# Patient Record
Sex: Female | Born: 1995 | Hispanic: Yes | Marital: Married | Smoking: Former smoker
Health system: Southern US, Community
[De-identification: ages and names within clinical notes are randomized; demographics above are authoritative.]

## PROBLEM LIST (undated history)

## (undated) ENCOUNTER — Inpatient Hospital Stay (HOSPITAL_COMMUNITY): Payer: Self-pay

## (undated) DIAGNOSIS — O24419 Gestational diabetes mellitus in pregnancy, unspecified control: Secondary | ICD-10-CM

## (undated) DIAGNOSIS — O09299 Supervision of pregnancy with other poor reproductive or obstetric history, unspecified trimester: Secondary | ICD-10-CM

## (undated) DIAGNOSIS — R51 Headache: Secondary | ICD-10-CM

## (undated) DIAGNOSIS — B999 Unspecified infectious disease: Secondary | ICD-10-CM

## (undated) DIAGNOSIS — R519 Headache, unspecified: Secondary | ICD-10-CM

## (undated) HISTORY — PX: CERVICAL CERCLAGE: SHX1329

## (undated) HISTORY — DX: Supervision of pregnancy with other poor reproductive or obstetric history, unspecified trimester: O09.299

## (undated) HISTORY — PX: WISDOM TOOTH EXTRACTION: SHX21

---
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
## 2015-04-27 ENCOUNTER — Encounter (HOSPITAL_COMMUNITY): Payer: Self-pay | Admitting: Family Medicine

## 2015-04-27 ENCOUNTER — Emergency Department (HOSPITAL_COMMUNITY)
Admission: EM | Admit: 2015-04-27 | Discharge: 2015-04-27 | Disposition: A | Discharge status: Home / Self Care | Payer: Self-pay | Attending: Emergency Medicine | Admitting: Emergency Medicine

## 2015-04-27 ENCOUNTER — Emergency Department (HOSPITAL_COMMUNITY): Payer: Self-pay

## 2015-04-27 DIAGNOSIS — R1030 Lower abdominal pain, unspecified: Secondary | ICD-10-CM

## 2015-04-27 DIAGNOSIS — R1031 Right lower quadrant pain: Secondary | ICD-10-CM | POA: Insufficient documentation

## 2015-04-27 DIAGNOSIS — R52 Pain, unspecified: Secondary | ICD-10-CM

## 2015-04-27 DIAGNOSIS — Z720 Tobacco use: Secondary | ICD-10-CM | POA: Insufficient documentation

## 2015-04-27 DIAGNOSIS — N898 Other specified noninflammatory disorders of vagina: Secondary | ICD-10-CM | POA: Insufficient documentation

## 2015-04-27 DIAGNOSIS — Z331 Pregnant state, incidental: Secondary | ICD-10-CM | POA: Insufficient documentation

## 2015-04-27 DIAGNOSIS — R1032 Left lower quadrant pain: Secondary | ICD-10-CM | POA: Insufficient documentation

## 2015-04-27 DIAGNOSIS — Z3490 Encounter for supervision of normal pregnancy, unspecified, unspecified trimester: Secondary | ICD-10-CM

## 2015-04-27 LAB — BASIC METABOLIC PANEL
Anion gap: 8 (ref 5–15)
BUN: <5 mg/dL — ABNORMAL LOW (ref 6–20)
CHLORIDE: 106 mmol/L (ref 101–111)
CO2: 23 mmol/L (ref 22–32)
Calcium: 8.8 mg/dL — ABNORMAL LOW (ref 8.9–10.3)
Creatinine, Ser: 0.7 mg/dL (ref 0.44–1.00)
GFR calc Af Amer: >60 mL/min (ref >60)
GLUCOSE: 112 mg/dL — AB (ref 65–99)
POTASSIUM: 3.1 mmol/L — AB (ref 3.5–5.1)
Sodium: 137 mmol/L (ref 135–145)

## 2015-04-27 LAB — CBC
HCT: 38.1 % (ref 36.0–46.0)
Hemoglobin: 12.6 g/dL (ref 12.0–15.0)
MCH: 28.4 pg (ref 26.0–34.0)
MCHC: 33.1 g/dL (ref 30.0–36.0)
MCV: 86 fL (ref 78.0–100.0)
PLATELETS: 389 10*3/uL (ref 150–400)
RBC: 4.43 MIL/uL (ref 3.87–5.11)
RDW: 14.4 % (ref 11.5–15.5)
WBC: 5.2 10*3/uL (ref 4.0–10.5)

## 2015-04-27 LAB — WET PREP, GENITAL
Trich, Wet Prep: NONE SEEN
YEAST WET PREP: NONE SEEN

## 2015-04-27 LAB — URINALYSIS, ROUTINE W REFLEX MICROSCOPIC
BILIRUBIN URINE: NEGATIVE
GLUCOSE, UA: NEGATIVE mg/dL
Hgb urine dipstick: NEGATIVE
Ketones, ur: NEGATIVE mg/dL
LEUKOCYTES UA: NEGATIVE
NITRITE: NEGATIVE
PH: 6.5 (ref 5.0–8.0)
PROTEIN: NEGATIVE mg/dL
Specific Gravity, Urine: 1.017 (ref 1.005–1.030)
UROBILINOGEN UA: 1 mg/dL (ref 0.0–1.0)

## 2015-04-27 LAB — HCG, QUANTITATIVE, PREGNANCY: hCG, Beta Chain, Quant, S: 700 m[IU]/mL — ABNORMAL HIGH (ref <5)

## 2015-04-27 LAB — PREGNANCY, URINE: PREG TEST UR: POSITIVE — AB

## 2015-04-27 LAB — ABO/RH: ABO/RH(D): A POS

## 2015-04-27 MED ORDER — POTASSIUM CHLORIDE CRYS ER 20 MEQ PO TBCR
40.0000 meq | EXTENDED_RELEASE_TABLET | Freq: Once | ORAL | Status: AC
  Administered 2015-04-27: 40 meq via ORAL
  Filled 2015-04-27: qty 2

## 2015-04-27 MED ORDER — PRENATAL COMPLETE 14-0.4 MG PO TABS: 2.0000 | ORAL_TABLET | Freq: Every day | ORAL | Status: DC

## 2015-04-27 NOTE — Discharge Instructions (Signed)
1. Medications: prenatal vitamins 2. Treatment: rest, drink plenty of fluids,  3. Follow Up: Please followup with the OB/GYN listed for repeat blood work in 48 hours; return to the ED or MAU for vaginal bleeding, worsening symptoms or other concerns   Abdominal Pain During Pregnancy Abdominal pain is common in pregnancy. Most of the time, it does not cause harm. There are many causes of abdominal pain. Some causes are more serious than others. Some of the causes of abdominal pain in pregnancy are easily diagnosed. Occasionally, the diagnosis takes time to understand. Other times, the cause is not determined. Abdominal pain can be a sign that something is very wrong with the pregnancy, or the pain may have nothing to do with the pregnancy at all. For this reason, always tell your health care provider if you have any abdominal discomfort. HOME CARE INSTRUCTIONS  Monitor your abdominal pain for any changes. The following actions may help to alleviate any discomfort you are experiencing:  Do not have sexual intercourse or put anything in your vagina until your symptoms go away completely.  Get plenty of rest until your pain improves.  Drink clear fluids if you feel nauseous. Avoid solid food as long as you are uncomfortable or nauseous.  Only take over-the-counter or prescription medicine as directed by your health care provider.  Keep all follow-up appointments with your health care provider. SEEK IMMEDIATE MEDICAL CARE IF:  You are bleeding, leaking fluid, or passing tissue from the vagina.  You have increasing pain or cramping.  You have persistent vomiting.  You have painful or bloody urination.  You have a fever.  You notice a decrease in your baby's movements.  You have extreme weakness or feel faint.  You have shortness of breath, with or without abdominal pain.  You develop a severe headache with abdominal pain.  You have abnormal vaginal discharge with abdominal  pain.  You have persistent diarrhea.  You have abdominal pain that continues even after rest, or gets worse. MAKE SURE YOU:   Understand these instructions.  Will watch your condition.  Will get help right away if you are not doing well or get worse. Document Released: 09/24/2005 Document Revised: 07/15/2013 Document Reviewed: 04/23/2013 [facility] Patient Information 2015 [place], [place]. This information is not intended to replace advice given to you by your health care provider. Make sure you discuss any questions you have with your health care provider.    Emergency Department Resource Guide 1) Find a Doctor and Pay Out of Pocket Although you won't have to find out who is covered by your insurance plan, it is a good idea to ask around and get recommendations. You will then need to call the office and see if the doctor you have chosen will accept you as a new patient and what types of options they offer for patients who are self-pay. Some doctors offer discounts or will set up payment plans for their patients who do not have insurance, but you will need to ask so you aren't surprised when you get to your appointment.  2) Contact Your Local Health Department Not all health departments have doctors that can see patients for sick visits, but many do, so it is worth a call to see if yours does. If you don't know where your local health department is, you can check in your phone book. The CDC also has a tool to help you locate your state's health department, and many state websites also have listings of all  of their local health departments.  3) Find a Walk-in Clinic If your illness is not likely to be very severe or complicated, you may want to try a walk in clinic. These are popping up all over the country in pharmacies, drugstores, and shopping centers. They're usually staffed by nurse practitioners or physician assistants that have been trained to treat common illnesses and complaints.  They're usually fairly quick and inexpensive. However, if you have serious medical issues or chronic medical problems, these are probably not your best option.  No Primary Care Doctor: - Call Health Connect at  [phone] - they can help you locate a primary care doctor that  accepts your insurance, provides certain services, etc. - Physician Referral Service- [phone]  Chronic Pain Problems: Organization         Address  Phone   Notes  [facility]  [phone] Patients need to be referred by their primary care doctor.   Medication Assistance: Organization         Address  Phone   Notes  [facility] Medication [facility] [address] [place] [place], [place] [address] [phone] --Must be a resident of [facility] -- Must have NO insurance coverage whatsoever (no Medicaid/ Medicare, etc.) -- The pt. MUST have a primary care doctor that directs their care regularly and follows them in the community   [facility]  [phone]   [employer]  [phone]    Agencies that provide inexpensive medical care: Organization         Address  Phone   Notes  [facility]  [phone]   [facility]    [phone]   [facility] [address] [place], [place] [address] [phone]   [facility] [address], [place] [phone]   Planned Parenthood    8487148163   [facility]    [phone]   [facility]  [address], [place] Phone:  [phone], Fax:  [phone] Hours of Operation:  9 am - 6 pm, M-F.  Also accepts Medicaid/Medicare and self-pay.  [facility]  [address], [place] Phone: [phone], Fax: [phone]. Hours of Operation:  8:30 am - 5:30 pm, M-F.  Also accepts Medicaid and self-pay.  [facility] [place] [address], [place]  Phone: [phone]   [facility] [address] Natasha Bence [place], [place] [phone], Ext. [phone] Mondays & Thursdays: 7-9 AM.  First 15 patients are seen on a first come, first serve basis.    Medicaid-accepting [facility] Providers:  Organization         Address  Phone   Notes  [facility] [address], [place] [phone] Also accepts self-pay patients.  [facility] [address] Laurell Josephs [place], [place]  [phone]   [facility] [address], [place] [phone]   [facility] [address], [place] [phone]   Renaye Rakers [address], [place]   [phone] Only accepts [place] Access [place] patients after they have their name applied to their card.   Self-Pay (no insurance) in [facility]:  Organization         Address  Phone   Notes  Sickle  Cell Patients, [facility] [address], [place] [phone]) [phone]   [facility] [address], [place] [phone]) [phone]   [facility] [place]  [address], [place] [phone]   [facility]/Dr. Osei-Bonsu  [address] [place] [address], [place] or [phone], [place] [phone] Phone number for both [place] and [place] locations is the same.  [facility] [address], [place] [phone]) [phone]   [facility] [address] [place] [address], [place] or [address] [phone]   [facility] [address], [place] [phone](336) [phone], phone; [phone], fax Sees patients 1st and 3rd Saturday of every month.  Must not qualify for public or private insurance (i.e. Medicaid, Medicare, [place] Health Choice, Veterans' Benefits)  Household income should be no more than 200% of the poverty level The clinic cannot  treat you if you are pregnant or think you are pregnant  Sexually transmitted diseases are not treated at the clinic.    Dental Care: Organization         Address  Phone  Notes  [facility] [address], [place] [phone] Accepts children up to age 20 who are enrolled in [place] or [place] Health Choice; pregnant women with a Medicaid card; and children who have applied for Medicaid or [place] Health Choice, but were declined, whose parents can pay a reduced fee at time of service.  [facility] [place]  [address], [place] [phone] Accepts children up to age 20 who are enrolled in [place] or [place] Health Choice; pregnant women with a Medicaid card; and children who have applied for Medicaid or [place] Health Choice, but were declined, whose parents can pay a reduced fee at time of service.  [facility]  [address], [place] [phone]) [phone] Patients are seen by appointment only. Walk-ins are not accepted. [facility] will see patients 20 years of age and older. Monday - Tuesday (8am-5pm) Most Wednesdays (8:30-5pm) $30 per visit, cash only  [facility]  [address], [facility] [phone]) [phone] Patients are seen by appointment only. Walk-ins are not accepted. [facility] will see patients 20 years of age and older. One Wednesday Evening (Monthly: Volunteer Based).  $30 per visit, cash only  [employer] [facility]  [phone] for adults; Children under age 864, call Graduate Pediatric Dentistry at [phone]) [phone]. Children aged 894-14, please call [phone] to request a pediatric application.  Dental services are provided in all areas of dental care including fillings, crowns and bridges, complete and partial dentures, implants, gum treatment, root canals, and extractions. Preventive care is also provided.  Treatment is provided to both adults and children. Patients are selected via a lottery and there is often a waiting list.   [facility] [address], [place]  [phone] [email or web address]   [facility] [address], [place], [place] [phone], Ext. [phone] Second and Fourth Thursday of each month, opens at 6:30 AM; Clinic ends at 9 AM.  Patients are seen on a first-come first-served basis, and a limited number are seen during each clinic.   [facility]  [address], [place], [place] [phone]   Eligibility Requirements  You must have lived in [place], [place], or [place] counties for at least the last three months.   You cannot be eligible for state or federal sponsored [place], including [employer], [place], or [employer].   You generally cannot be eligible for healthcare insurance through your employer.    How to apply: Eligibility screenings are held every Tuesday and Wednesday afternoon from 1:00 pm until 4:00 pm. You do not need an appointment for the interview!  [facility] [address] [place], [place] [phone]   [facility] Department  [phone]   [facility] Health Department  [phone]   [facility] Health Department  [phone]    Behavioral Health Resources in the Community: Intensive Outpatient Programs Organization         Address  Phone  Notes  [facility] [address] [place], [place] [phone]   [facility] [address], [place], [place] [phone]   [facility] [address], [place], [place]  [phone]   [facility] [address],  [place], [place] [phone] or [phone]) [phone]   Substance Abuse Resources Organization         Address  Phone  Notes  [facility]  [phone]   [facility]   [phone])[phone]   [facility] [place]  (774)036-1845   Floydene Flock  3341995754   Residential & Outpatient Substance Abuse Program  [phone]   Psychological Services Organization         Address  Phone  Notes  [facility]  [phone]   [facility]  ([phone]   [facility] [address] [place] [phone]) [phone] or [phone]    Mobile Crisis Teams Organization         Address  Phone  Notes  [facility], Mobile Crisis Care Unit  [phone]   [facility]  [address] [place], [place] [phone]   Doristine Locks [address] [place] [phone]    Self-Help/Support Groups Organization         Address  Phone             Notes  [facility] [place] - variety of support groups  336- I7437963 Call for more information  Narcotics Anonymous (NA), [facility] [address], [employer] [place]  2 meetings at this location   Statistician         Address  Phone  Notes  [facility] [address],    [place]  [phone]   [facility]  [address] [place] [address], [place], [place] [phone]   [facility] [address] [place], [place] [phone] Admissions: 8am-3pm M-F  [facility] [address]    [place], [place] [phone]   [facility] [address] Starling Manns [place], [place] [phone]   [facility] [address],  [place], [place] [phone]   [facility] - Intensive Outpatient [address] Laurell Josephs [address], [place], [place] [phone]   [facility] ([facility] [address]  [place], [place] [phone] or [phone])[phone]   [facility] ([facility]) [address], [place], [place] [phone] Accepts Medicaid  [facility] [place] [address],  [place] [phone] Substance  Abuse/Addiction Treatment   [facility] Resources Organization  Address  Phone  Notes  °[facility]  (888) [phone]   °Julie Brannon, PhD [address] [place], [place]   (336) 349-5553 or (336) 951-0000   ° Behavioral   [address] °[place], [employer] ([phone]) [phone]   °[facility] [address], [place], [place] ([phone]) [phone] Insurance/Medicaid/sponsorship through [facility]  °[facility] [address]                                    [place], [place] (336) [phone] Therapy/tele-psych/case  °[facility] [address]  ° [place], [place] ([phone]) [phone]    °Dr. Arfeen  ([phone]) [phone]   °[facility] [place]  [employer] [place] [facility] 1) [address],  °2) [address], [place] °3)  [address], [place] ([phone]) [phone] °(336) [phone] ° °(336) [phone]   °[place] Child Abuse Hotline (336) 342-1394 or (336) 342-3537 (After Hours)    ° ° ° ° °

## 2015-04-27 NOTE — ED Notes (Signed)
Patient is alert and orientedx4.  Patient was explained discharge instructions and they understood them with no questions.  The husband is taking the patient home.

## 2015-04-27 NOTE — ED Provider Notes (Signed)
CSN: [id number]     Arrival date & time 04/27/15  1611 History   First MD Initiated Contact with Patient 04/27/15 1713     Chief Complaint  Patient presents with  . Abdominal Pain     (Consider location/radiation/quality/duration/timing/severity/associated sxs/prior Treatment) Patient is a 19 y.o. female presenting with abdominal pain. The history is provided by the patient and medical records. No language interpreter was used.  Abdominal Pain Associated symptoms: no chest pain, no constipation, no cough, no diarrhea, no dysuria, no fatigue, no fever, no hematuria, no nausea, no shortness of breath and no vomiting      Cynthia Valenzuela is a 19 y.o. female G2P0010 with no major medical hgx presents to the Emergency Department complaining of gradual, persistent, progressively worsening lower abd pain onset 3 weeks.  Pt reports the pain is similar to menstrual cramps, sometimes being sharp, resolving on their on.  At the worst pt rates her pain at 7/10.  No cramping at this time.  She denies vaginal bleeding, dysuria, vaginal discharge, nausea, vomiting.  No associated symptoms, no aggravating or alleviating factors.    Pt denies fever, chills, headache, neck pain, chest pain, SOB, N/V/D, dysuria.   Pt reports some juice intake but no water intake on a regular basis.   LMP: 03/24/2015 but shorter than usual.   History reviewed. No pertinent past medical history. History reviewed. No pertinent past surgical history. History reviewed. No pertinent family history. History  Substance Use Topics  . Smoking status: Light Tobacco Smoker  . Smokeless tobacco: Not on file  . Alcohol Use: No   OB History    No data available     Review of Systems  Constitutional: Negative for fever, diaphoresis, appetite change, fatigue and unexpected weight change.  HENT: Negative for mouth sores.   Eyes: Negative for visual disturbance.  Respiratory: Negative for cough, chest tightness, shortness of breath  and wheezing.   Cardiovascular: Negative for chest pain.  Gastrointestinal: Positive for abdominal pain. Negative for nausea, vomiting, diarrhea and constipation.  Endocrine: Negative for polydipsia, polyphagia and polyuria.  Genitourinary: Negative for dysuria, urgency, frequency and hematuria.  Musculoskeletal: Negative for back pain and neck stiffness.  Skin: Negative for rash.  Allergic/Immunologic: Negative for immunocompromised state.  Neurological: Negative for syncope, light-headedness and headaches.  Hematological: Does not bruise/bleed easily.  Psychiatric/Behavioral: Negative for sleep disturbance. The patient is not nervous/anxious.       Allergies  Review of patient's allergies indicates no known allergies.  Home Medications   Prior to Admission medications   Medication Sig Start Date End Date Taking? Authorizing Provider  Prenatal Vit-Fe Fumarate-FA (PRENATAL COMPLETE) 14-0.4 MG TABS Take 2 tablets by mouth daily. 04/27/15   Dontrez Pettis, PA-C   BP 95/54 mmHg  Pulse 77  Temp(Src) 97.7 F (36.5 C) (Oral)  Resp 20  SpO2 100%  LMP 03/24/2015 Physical Exam  Constitutional: She appears well-developed and well-nourished. No distress.  Awake, alert, nontoxic appearance  HENT:  Head: Normocephalic and atraumatic.  Mouth/Throat: Oropharynx is clear and moist. No oropharyngeal exudate.  Eyes: Conjunctivae are normal. No scleral icterus.  Neck: Normal range of motion. Neck supple.  Cardiovascular: Normal rate, regular rhythm, normal heart sounds and intact distal pulses.   No murmur heard. Pulmonary/Chest: Effort normal and breath sounds normal. No respiratory distress. She has no wheezes.  Equal chest expansion  Abdominal: Soft. Bowel sounds are normal. She exhibits no distension and no mass. There is no tenderness. There is no rebound and  no guarding. Hernia confirmed negative in the right inguinal area and confirmed negative in the left inguinal area.   Genitourinary: Uterus normal. No labial fusion. There is no rash, tenderness or lesion on the right labia. There is no rash, tenderness or lesion on the left labia. Uterus is not deviated, not enlarged, not fixed and not tender. Cervix exhibits no motion tenderness, no discharge and no friability. Right adnexum displays no mass, no tenderness and no fullness. Left adnexum displays no mass, no tenderness and no fullness. No erythema, tenderness or bleeding in the vagina. No foreign body around the vagina. No signs of injury around the vagina. Vaginal discharge: leukorrhea.  Musculoskeletal: Normal range of motion. She exhibits no edema.  Lymphadenopathy:       Right: No inguinal adenopathy present.       Left: No inguinal adenopathy present.  Neurological: She is alert.  Speech is clear and goal oriented Moves extremities without ataxia  Skin: Skin is warm and dry. She is not diaphoretic. No erythema.  Psychiatric: She has a normal mood and affect.  Nursing note and vitals reviewed.   ED Course  Procedures (including critical care time) Labs Review Labs Reviewed  WET PREP, GENITAL - Abnormal; Notable for the following:    Clue Cells Wet Prep HPF POC FEW (*)    WBC, Wet Prep HPF POC MODERATE (*)    All other components within normal limits  PREGNANCY, URINE - Abnormal; Notable for the following:    Preg Test, Ur POSITIVE (*)    All other components within normal limits  HCG, QUANTITATIVE, PREGNANCY - Abnormal; Notable for the following:    hCG, Beta Chain, Quant, S 700 (*)    All other components within normal limits  BASIC METABOLIC PANEL - Abnormal; Notable for the following:    Potassium 3.1 (*)    Glucose, Bld 112 (*)    BUN <5 (*)    Calcium 8.8 (*)    All other components within normal limits  URINALYSIS, ROUTINE W REFLEX MICROSCOPIC (NOT AT [facility])  CBC  HIV ANTIBODY (ROUTINE TESTING)  ABO/RH  GC/CHLAMYDIA PROBE AMP ([place]) NOT AT [facility]    Imaging Review US Ob Comp  Less 14 Wks  04/27/2015   CLINICAL DATA:  Intermittent lower abdominal pain for 3 weeks. Positive pregnancy test. Quantitative beta HCG 700.  EXAM: OBSTETRIC <14 WK [place] AND TRANSVAGINAL OB US  TECHNIQUE: Both transabdominal and transvaginal ultrasound examinations were performed for complete evaluation of the gestation as well as the maternal uterus, adnexal regions, and pelvic cul-de-sac. Transvaginal technique was performed to assess early pregnancy.  COMPARISON:  None.  FINDINGS: Intrauterine gestational sac: Not visualized  Yolk sac:  Not visualized  Embryo:  Not visualized  Cardiac Activity: Not detected  Maternal uterus/adnexae: Uterus is normal in size and retroverted. Endometrium measures 8.6 mm. No visualized IUP. Uterus measures 8.2 x 5.1 x 5.9 cm. Right ovary measures 3.6 x 2.6 x 2.9 cm. Left ovary measures 2.1 x 1.7 x 2.1 cm. Small ovarian follicles bilaterally. No ovarian or definite adnexal abnormality. Small amount of pelvic free fluid.  IMPRESSION: No visualized IUP or acute abnormality by ultrasound. Findings suggest a pregnancy too early to visualize. Difficult to completely exclude ectopic.  No definite adnexal or ovarian abnormality.  Trace pelvic free fluid.   Electronically Signed   By: Judie Petit.  Shick M.D.   On: 04/27/2015 19:04   US Ob Transvaginal  04/27/2015   CLINICAL DATA:  Intermittent lower  abdominal pain for 3 weeks. Positive pregnancy test. Quantitative beta HCG 700.  EXAM: OBSTETRIC <14 WK [place] AND TRANSVAGINAL OB US  TECHNIQUE: Both transabdominal and transvaginal ultrasound examinations were performed for complete evaluation of the gestation as well as the maternal uterus, adnexal regions, and pelvic cul-de-sac. Transvaginal technique was performed to assess early pregnancy.  COMPARISON:  None.  FINDINGS: Intrauterine gestational sac: Not visualized  Yolk sac:  Not visualized  Embryo:  Not visualized  Cardiac Activity: Not detected  Maternal uterus/adnexae: Uterus is normal in size and  retroverted. Endometrium measures 8.6 mm. No visualized IUP. Uterus measures 8.2 x 5.1 x 5.9 cm. Right ovary measures 3.6 x 2.6 x 2.9 cm. Left ovary measures 2.1 x 1.7 x 2.1 cm. Small ovarian follicles bilaterally. No ovarian or definite adnexal abnormality. Small amount of pelvic free fluid.  IMPRESSION: No visualized IUP or acute abnormality by ultrasound. Findings suggest a pregnancy too early to visualize. Difficult to completely exclude ectopic.  No definite adnexal or ovarian abnormality.  Trace pelvic free fluid.   Electronically Signed   By: Judie Petit.  Shick M.D.   On: 04/27/2015 19:04     EKG Interpretation None      MDM   Final diagnoses:  Pregnancy  Lower abdominal pain  Bilateral lower abdominal cramping   Cynthia Valenzuela presents with lower abd cramping x 3 weeks; intermittent.  Pt with positive pregnancy test.  No CMT; leukorrhea, cervical os closed. Labs and [place] pending.    7:32 PM Labs reassuring. No evidence of vaginal infection. Mild hypokalemia repleted in the department.  HCG 700 therefore I would not expect to see IUP. Ultrasound performed prior to result of hCG, no evidence of IUP but no evidence of ectopic either. Patient is to follow up with women's outpatient clinic for recheck of her hCG in 48 hours. This was discussed at length with her. If she cannot follow in the women's clinic or MAU she is to return to this emergency department.  Other pregnancy information given. All questions answered. Patient with stable vital signs and pain-free upon discharge. She is tolerating by mouth without difficulty.  BP 113/67 mmHg  Pulse 73  Temp(Src) 97.7 F (36.5 C) (Oral)  Resp 20  SpO2 100%  LMP 03/24/2015   Cynthia Client Maxon Kresse, PA-C 04/27/15 1939  Pricilla Loveless, MD 04/27/15 2256

## 2015-04-27 NOTE — ED Notes (Signed)
Patient transported to Ultrasound 

## 2015-04-27 NOTE — ED Notes (Signed)
Pt here for intermittent lower abd pain x 3 weeks with some diarrhea. Denies vaginal bleeding or abnormal discharge. Denies urinary complaints,

## 2015-04-27 NOTE — ED Notes (Signed)
MD at bedside. 

## 2015-04-28 LAB — HIV ANTIBODY (ROUTINE TESTING W REFLEX): HIV Screen 4th Generation wRfx: NONREACTIVE

## 2015-04-28 LAB — GC/CHLAMYDIA PROBE AMP (~~LOC~~) NOT AT ARMC
CHLAMYDIA, DNA PROBE: NEGATIVE
NEISSERIA GONORRHEA: NEGATIVE

## 2015-04-29 ENCOUNTER — Telehealth (HOSPITAL_COMMUNITY): Payer: Self-pay | Admitting: *Deleted

## 2015-04-29 ENCOUNTER — Encounter (HOSPITAL_COMMUNITY): Payer: Self-pay | Admitting: *Deleted

## 2015-04-29 ENCOUNTER — Inpatient Hospital Stay (HOSPITAL_COMMUNITY)
Admission: AD | Admit: 2015-04-29 | Discharge: 2015-04-29 | Disposition: A | Discharge status: Home / Self Care | Payer: Self-pay | Source: Ambulatory Visit | Attending: Obstetrics & Gynecology | Admitting: Obstetrics & Gynecology

## 2015-04-29 DIAGNOSIS — O209 Hemorrhage in early pregnancy, unspecified: Secondary | ICD-10-CM | POA: Insufficient documentation

## 2015-04-29 DIAGNOSIS — O3680X0 Pregnancy with inconclusive fetal viability, not applicable or unspecified: Secondary | ICD-10-CM

## 2015-04-29 DIAGNOSIS — Z3A01 Less than 8 weeks gestation of pregnancy: Secondary | ICD-10-CM | POA: Insufficient documentation

## 2015-04-29 DIAGNOSIS — O4691 Antepartum hemorrhage, unspecified, first trimester: Secondary | ICD-10-CM

## 2015-04-29 DIAGNOSIS — Z87891 Personal history of nicotine dependence: Secondary | ICD-10-CM | POA: Insufficient documentation

## 2015-04-29 LAB — URINALYSIS, ROUTINE W REFLEX MICROSCOPIC
Bilirubin Urine: NEGATIVE
GLUCOSE, UA: NEGATIVE mg/dL
Nitrite: NEGATIVE
PROTEIN: 30 mg/dL — AB
Specific Gravity, Urine: 1.03 (ref 1.005–1.030)
Urobilinogen, UA: 0.2 mg/dL (ref 0.0–1.0)
pH: 6 (ref 5.0–8.0)

## 2015-04-29 LAB — URINE MICROSCOPIC-ADD ON

## 2015-04-29 LAB — HCG, QUANTITATIVE, PREGNANCY: hCG, Beta Chain, Quant, S: 586 m[IU]/mL — ABNORMAL HIGH (ref <5)

## 2015-04-29 NOTE — MAU Note (Signed)
Patient presents stating that she had a positive pregnancy test confirmed at [facility] on 7/20 but is experiencing abdominal pain and vaginal bleeding similar to a period this morning. Denies discharge.

## 2015-04-29 NOTE — MAU Provider Note (Signed)
History     CSN: [id number]  Arrival date and time: 04/29/15 2130   First Provider Initiated Contact with Patient 04/29/15 1114      Chief Complaint  Patient presents with  . Abdominal Pain  . Vaginal Bleeding   HPI   Ms. Cynthia Valenzuela is a 19 y.o. female G2P0010 at  [redacted]w[redacted]d presenting to MAU with vaginal bleeding. She was seen at [facility] 2 days ago and had a pregnancy hormone level of 700; [place] did not show anything. She was instructed to go to the [facility] today for a repeat beta hcg level, however the patient came here because she is bleeding. The vaginal bleeding is light; red in color. The patient had intercourse last night and noticed the bleeding after. She does not have a pad on, the bleeding is not heavy enough to wear a pad.   OB History    Gravida Para Term Preterm AB TAB SAB Ectopic Multiple Living   2    1 1           History reviewed. No pertinent past medical history.  History reviewed. No pertinent past surgical history.  No family history on file.  History  Substance Use Topics  . Smoking status: Former Smoker    Quit date: 04/22/2015  . Smokeless tobacco: Never Used  . Alcohol Use: Yes     Comment: has not drank in a while     Allergies: No Known Allergies  Prescriptions prior to admission  Medication Sig Dispense Refill Last Dose  . Prenatal Vit-Fe Fumarate-FA (PRENATAL MULTIVITAMIN) TABS tablet Take 1 tablet by mouth daily at 12 noon.   04/28/2015 at Unknown time   Results for orders placed or performed during the hospital encounter of 04/29/15 (from the past 48 hour(s))  Urinalysis, Routine w reflex microscopic (not at [facility])     Status: Abnormal   Collection Time: 04/29/15 10:18 AM  Result Value Ref Range   Color, Urine YELLOW YELLOW   APPearance HAZY (A) CLEAR   Specific Gravity, Urine 1.030 1.005 - 1.030   pH 6.0 5.0 - 8.0   Glucose, UA NEGATIVE NEGATIVE mg/dL   Hgb urine dipstick LARGE (A) NEGATIVE   Bilirubin Urine NEGATIVE NEGATIVE    Ketones, ur >80 (A) NEGATIVE mg/dL   Protein, ur 30 (A) NEGATIVE mg/dL   Urobilinogen, UA 0.2 0.0 - 1.0 mg/dL   Nitrite NEGATIVE NEGATIVE   Leukocytes, UA TRACE (A) NEGATIVE  Urine microscopic-add on     Status: Abnormal   Collection Time: 04/29/15 10:18 AM  Result Value Ref Range   Squamous Epithelial / LPF MANY (A) RARE   WBC, UA 0-2 <3 WBC/hpf   Urine-Other MICROSCOPIC EXAM PERFORMED ON UNCONCENTRATED URINE     Comment: <1 ML URINE COLLECTED MUCOUS PRESENT   hCG, quantitative, pregnancy     Status: Abnormal   Collection Time: 04/29/15 11:24 AM  Result Value Ref Range   hCG, Beta Chain, Quant, S 586 (H) <5 mIU/mL    Comment:          GEST. AGE      CONC.  (mIU/mL)   <=1 WEEK        5 - 50     2 WEEKS       50 - 500     3 WEEKS       100 - 10,000     4 WEEKS     1,000 - 30,000     5 WEEKS  3,500 - 115,000   6-8 WEEKS     12,000 - 270,000    12 WEEKS     15,000 - 220,000        FEMALE AND NON-PREGNANT FEMALE:     LESS THAN 5 mIU/mL     Review of Systems  Constitutional: Negative for fever and chills.  Gastrointestinal: Positive for abdominal pain (In the center of her lower abdomen).   Physical Exam   Blood pressure 112/47, pulse 71, temperature 98.3 F (36.8 C), temperature source Oral, resp. rate 20, height  (1.676 m), weight 122.698 kg (270 lb 8 oz), last menstrual period 03/24/2015.  Physical Exam  Constitutional: She is oriented to person, place, and time. She appears well-developed and well-nourished. No distress.  HENT:  Head: Normocephalic.  Eyes: Pupils are equal, round, and reactive to light.  Respiratory: Effort normal.  GI: Soft.  Genitourinary:  Cervix closed, anterior. Small amount of blood noted on exam glove.   Musculoskeletal: Normal range of motion.  Neurological: She is alert and oriented to person, place, and time.  Skin: Skin is warm. She is not diaphoretic.  Psychiatric: Her behavior is normal.    MAU Course  Procedures   None  MDM  Beta hcg level on 7/20: 700  A positive blood type- see merged chart Patient declines pain medication at this time.   Assessment and Plan   A:  1. Vaginal bleeding in pregnancy, first trimester   2. Pregnancy of unknown anatomic location    P:  Discharge home in stable condition Return to MAU if bleeding, pain worsens Return to MAU in 48 hours for repeat quant.  Ectopic precautions discussed at length Pelvic rest Bleeding precautions    Duane Lope, NP 04/29/2015 11:24 AM

## 2015-05-01 ENCOUNTER — Inpatient Hospital Stay (HOSPITAL_COMMUNITY)
Admission: AD | Admit: 2015-05-01 | Discharge: 2015-05-01 | Disposition: A | Discharge status: Home / Self Care | Payer: Self-pay | Source: Ambulatory Visit | Attending: Obstetrics and Gynecology | Admitting: Obstetrics and Gynecology

## 2015-05-01 DIAGNOSIS — O039 Complete or unspecified spontaneous abortion without complication: Secondary | ICD-10-CM | POA: Insufficient documentation

## 2015-05-01 LAB — HCG, QUANTITATIVE, PREGNANCY: hCG, Beta Chain, Quant, S: 104 m[IU]/mL — ABNORMAL HIGH (ref <5)

## 2015-05-01 NOTE — MAU Provider Note (Signed)
Subjective:  [name] is a 19 y.o. female G2P0010 presenting to MAU for a follow up beta hcg level. She was seen 2 days ago in MAU for vaginal bleeding. She was originally seen at [facility] on 7/20 (See merged chart)  With abdominal pain and then started bleeding on 7/22. She was diagnosed with a threatened miscarriage and told to follow up today for a quant.  Currently she is not having any pain or bleeding.    Objective:  GENERAL: Well-developed, well-nourished female in no acute distress.  LUNGS: Effort normal SKIN: Warm, dry and without erythema PSYCH: Normal mood and affect  Filed Vitals:   05/01/15 0950  BP: 123/64  Pulse: 82  Temp: 98.3 F (36.8 C)  Resp: 20   MDM:  A positive blood type   Beta hcg 7/20: 700 Beta hcg 7/22: 586 Beta hcg 7/24: 104   Assessment:  1. SAB (spontaneous abortion)      Plan:  Discharge home in stable condition Follow up in the [facility] in 1-2 weeks (the clinic will call you) Return to MAU immediately with any further pain or bleeding Pelvic rest Support given     Duane Lope, NP 05/01/2015 10:49 AM

## 2015-05-01 NOTE — MAU Note (Signed)
Pt presents to MAU for repeat labs BHCG. Denies any vaginal bleeding or pain 

## 2015-05-01 NOTE — Discharge Instructions (Signed)
Miscarriage °A miscarriage is the loss of an unborn baby (fetus) before the 20th week of pregnancy. The cause is often unknown.  °HOME CARE °· You may need to stay in bed (bed rest), or you may be able to do light activity. Go about activity as told by your doctor. °· Have help at home. °· Write down how many pads you use each day. Write down how soaked they are. °· Do not use tampons. Do not wash out your vagina (douche) or have sex (intercourse) until your doctor approves. °· Only take medicine as told by your doctor. °· Do not take aspirin. °· Keep all doctor visits as told. °· If you or your partner have problems with grieving, talk to your doctor. You can also try counseling. Give yourself time to grieve before trying to get pregnant again. °GET HELP RIGHT AWAY IF: °· You have bad cramps or pain in your back or belly (abdomen). °· You have a fever. °· You pass large clumps of blood (clots) from your vagina that are walnut-sized or larger. Save the clumps for your doctor to see. °· You pass large amounts of tissue from your vagina. Save the tissue for your doctor to see. °· You have more bleeding. °· You have thick, bad-smelling fluid (discharge) coming from the vagina. °· You get lightheaded, weak, or you pass out (faint). °· You have chills. °MAKE SURE YOU: °· Understand these instructions. °· Will watch your condition. °· Will get help right away if you are not doing well or get worse. °Document Released: 12/17/2011 Document Reviewed: 12/17/2011 °[employer]® Patient Information ©2015 [employer]. This information is not intended to replace advice given to you by your health care provider. Make sure you discuss any questions you have with your health care provider. ° °

## 2015-05-04 ENCOUNTER — Encounter (HOSPITAL_COMMUNITY): Payer: Self-pay

## 2015-05-04 ENCOUNTER — Encounter (HOSPITAL_COMMUNITY): Payer: Self-pay | Admitting: *Deleted

## 2015-05-04 ENCOUNTER — Inpatient Hospital Stay (HOSPITAL_COMMUNITY)
Admission: AD | Admit: 2015-05-04 | Discharge: 2015-05-04 | Disposition: A | Discharge status: Home / Self Care | Payer: Self-pay | Source: Ambulatory Visit | Attending: Obstetrics and Gynecology | Admitting: Obstetrics and Gynecology

## 2015-05-04 DIAGNOSIS — O039 Complete or unspecified spontaneous abortion without complication: Secondary | ICD-10-CM | POA: Insufficient documentation

## 2015-05-04 LAB — CBC
HCT: 39.7 % (ref 36.0–46.0)
HEMOGLOBIN: 13 g/dL (ref 12.0–15.0)
MCH: 28.5 pg (ref 26.0–34.0)
MCHC: 32.7 g/dL (ref 30.0–36.0)
MCV: 87.1 fL (ref 78.0–100.0)
Platelets: 340 10*3/uL (ref 150–400)
RBC: 4.56 MIL/uL (ref 3.87–5.11)
RDW: 14.9 % (ref 11.5–15.5)
WBC: 6.8 10*3/uL (ref 4.0–10.5)

## 2015-05-04 MED ORDER — OXYCODONE-ACETAMINOPHEN 5-325 MG PO TABS: 1.0000 | ORAL_TABLET | Freq: Four times a day (QID) | ORAL | Status: DC | PRN

## 2015-05-04 MED ORDER — IBUPROFEN 600 MG PO TABS: 600.0000 mg | ORAL_TABLET | Freq: Four times a day (QID) | ORAL | Status: DC | PRN

## 2015-05-04 MED ORDER — OXYCODONE-ACETAMINOPHEN 5-325 MG PO TABS
1.0000 | ORAL_TABLET | Freq: Four times a day (QID) | ORAL | Status: DC | PRN
  Administered 2015-05-04: 1 via ORAL
  Filled 2015-05-04: qty 1

## 2015-05-04 NOTE — MAU Note (Signed)
Pt passed small clot/tissue saved for possible path.

## 2015-05-04 NOTE — MAU Note (Signed)
States I was seen 3 days ago because of evaluation of my hormone levels.  Today I think I am mis-carrying.  States abdominal cramping a 10, with small-mod. Bleeding, some clots.

## 2015-05-04 NOTE — MAU Note (Signed)
Urine in lab 

## 2015-05-04 NOTE — MAU Note (Signed)
Pt told she is having a miscarriage on Sunday. Started having bad cramps and vagina bleeding this afternoon.. Felt like she was going to pass out.

## 2015-05-04 NOTE — MAU Provider Note (Signed)
History     CSN: [id number]  Arrival date and time: 05/04/15 7829   First Provider Initiated Contact with Patient 05/04/15 1957      Chief Complaint  Patient presents with  . Vaginal Bleeding   HPI Comments: Cynthia Valenzuela is a 19 y.o. G2P0010 at [redacted]w[redacted]d followed for falling quants and presumed SAB who presents with heavy bleeding and cramping with pain 10/10. Unsure if tissue passed. No orthostatic sx.  [place] on 04/27/15: no visualized IUP or ultrasound abnormality.     Vaginal Bleeding The patient's primary symptoms include vaginal bleeding. The patient's pertinent negatives include no genital lesions or vaginal discharge. This is a chronic problem. The current episode started in the past 7 days. The problem occurs intermittently (Became heavy and continuous early today). The problem has been gradually worsening. The pain is severe. The problem affects both sides. She is pregnant. Associated symptoms include abdominal pain. Pertinent negatives include no anorexia, back pain, chills, dysuria, fever, flank pain or frequency. The vaginal discharge was normal. The vaginal bleeding is heavier than menses. She has been passing clots (small clots). Passing tissue: sample on pad appears to be clot. Nothing aggravates the symptoms. She has tried nothing for the symptoms. She is sexually active. No, her partner does not have an STD. There is no history of an ectopic pregnancy or a gynecological surgery.    OB History  Gravida Para Term Preterm AB SAB TAB Ectopic Multiple Living  2 0 0 0 1 0 1 0      # Outcome Date GA Lbr Len/2nd Weight Sex Delivery Anes PTL Lv  2 Current           1 TAB                 No past medical history on file.  No past surgical history on file.  No family history on file.  History  Substance Use Topics  . Smoking status: Former Smoker    Quit date: 04/22/2015  . Smokeless tobacco: Never Used  . Alcohol Use: Yes     Comment: has not drank in a while      Allergies: No Known Allergies  Prescriptions prior to admission  Medication Sig Dispense Refill Last Dose  . Prenatal Vit-Fe Fumarate-FA (PRENATAL COMPLETE) 14-0.4 MG TABS Take 2 tablets by mouth daily. 60 each 10   . Prenatal Vit-Fe Fumarate-FA (PRENATAL MULTIVITAMIN) TABS tablet Take 1 tablet by mouth daily at 12 noon.   04/28/2015 at Unknown time    Review of Systems  Constitutional: Negative for fever and chills.  Gastrointestinal: Positive for abdominal pain. Negative for anorexia.  Genitourinary: Positive for vaginal bleeding. Negative for dysuria, frequency, flank pain and vaginal discharge.  Musculoskeletal: Negative for back pain.   Physical Exam   Blood pressure 120/67, pulse 82, temperature 97.9 F (36.6 C), resp. rate 22, height  (1.676 m), weight 121.836 kg (268 lb 9.6 oz), last menstrual period 03/24/2015.  Physical Exam  Nursing note and vitals reviewed. Constitutional: She is oriented to person, place, and time. She appears well-developed and well-nourished. No distress.  HENT:  Head: Normocephalic.  Eyes: Pupils are equal, round, and reactive to light.  Cardiovascular: Normal rate.   Respiratory: Effort normal.  GI: She exhibits no distension. There is no tenderness.  Genitourinary:  Moderate blood in vault. No tissue Cx closed. Minimal active bleeding.  Musculoskeletal: Normal range of motion.  Neurological: She is alert and oriented to person, place, and time.  Skin: Skin is warm and dry.    MAU Course  Procedures Results for KRYSTALLE, PILKINGTON (MRN [id number]) as of 05/04/2015 20:23  Ref. Range 04/27/2015 16:34 04/27/2015 17:50 04/27/2015 18:20 04/27/2015 18:56 04/29/2015 10:18 04/29/2015 11:24 05/01/2015 09:45 05/04/2015 19:05  HCG, Beta Chain, Quant, S Latest Ref Range: <5 mIU/mL  700 (H)    586 (H) 104 (H)   --/--/A POS (07/20 1750)  Results for orders placed or performed during the hospital encounter of 05/04/15 (from the past 24 hour(s))  CBC      Status: None   Collection Time: 05/04/15  7:05 PM  Result Value Ref Range   WBC 6.8 4.0 - 10.5 K/uL   RBC 4.56 3.87 - 5.11 MIL/uL   Hemoglobin 13.0 12.0 - 15.0 g/dL   HCT 46.9 62.9 - 52.8 %   MCV 87.1 78.0 - 100.0 fL   MCH 28.5 26.0 - 34.0 pg   MCHC 32.7 30.0 - 36.0 g/dL   RDW 41.3 24.4 - 01.0 %   Platelets 340 150 - 400 K/uL   Percocet 5/325 ii po given @2030   MDM Presentation consistent with SAB in progress. Hemodynamically stable and bleeding light-moderate. Counseled to expect heavier flow than period. Percocet if ibuprofen ineffective for cramps  Assessment and Plan   1. SAB (spontaneous abortion)      Medication List    STOP taking these medications        prenatal multivitamin Tabs tablet      TAKE these medications        ibuprofen 600 MG tablet  Commonly known as:  ADVIL,MOTRIN  Take 1 tablet (600 mg total) by mouth every 6 (six) hours as needed.     oxyCODONE-acetaminophen 5-325 MG per tablet  Commonly known as:  PERCOCET/ROXICET  Take 1-2 tablets by mouth every 6 (six) hours as needed for moderate pain or severe pain.     PRENATAL COMPLETE 14-0.4 MG Tabs  Take 2 tablets by mouth daily.       Follow-up Information    Follow up with [facility].   Specialty:  Obstetrics and Gynecology   Why:  Someone from Clinic will call you with appt.   Contact information:   [address] [place] [address] [phone]      Cynthia Valenzuela 05/04/2015, 8:02 PM

## 2015-05-19 ENCOUNTER — Encounter: Payer: Self-pay | Admitting: Family Medicine

## 2015-09-30 ENCOUNTER — Encounter (HOSPITAL_COMMUNITY): Payer: Self-pay | Admitting: *Deleted

## 2015-09-30 ENCOUNTER — Inpatient Hospital Stay (HOSPITAL_COMMUNITY)
Admission: AD | Admit: 2015-09-30 | Discharge: 2015-09-30 | Disposition: A | Discharge status: Home / Self Care | Payer: Self-pay | Source: Ambulatory Visit | Attending: Family Medicine | Admitting: Family Medicine

## 2015-09-30 ENCOUNTER — Inpatient Hospital Stay (HOSPITAL_COMMUNITY): Payer: Self-pay

## 2015-09-30 DIAGNOSIS — O468X1 Other antepartum hemorrhage, first trimester: Secondary | ICD-10-CM

## 2015-09-30 DIAGNOSIS — O418X10 Other specified disorders of amniotic fluid and membranes, first trimester, not applicable or unspecified: Secondary | ICD-10-CM

## 2015-09-30 DIAGNOSIS — F17200 Nicotine dependence, unspecified, uncomplicated: Secondary | ICD-10-CM | POA: Insufficient documentation

## 2015-09-30 DIAGNOSIS — Z3491 Encounter for supervision of normal pregnancy, unspecified, first trimester: Secondary | ICD-10-CM

## 2015-09-30 DIAGNOSIS — O209 Hemorrhage in early pregnancy, unspecified: Secondary | ICD-10-CM | POA: Insufficient documentation

## 2015-09-30 DIAGNOSIS — O4691 Antepartum hemorrhage, unspecified, first trimester: Secondary | ICD-10-CM

## 2015-09-30 DIAGNOSIS — Z3A01 Less than 8 weeks gestation of pregnancy: Secondary | ICD-10-CM | POA: Insufficient documentation

## 2015-09-30 DIAGNOSIS — O99331 Smoking (tobacco) complicating pregnancy, first trimester: Secondary | ICD-10-CM | POA: Insufficient documentation

## 2015-09-30 LAB — CBC
HCT: 35.4 % — ABNORMAL LOW (ref 36.0–46.0)
HEMOGLOBIN: 11.5 g/dL — AB (ref 12.0–15.0)
MCH: 28.5 pg (ref 26.0–34.0)
MCHC: 32.5 g/dL (ref 30.0–36.0)
MCV: 87.6 fL (ref 78.0–100.0)
Platelets: 346 10*3/uL (ref 150–400)
RBC: 4.04 MIL/uL (ref 3.87–5.11)
RDW: 14.5 % (ref 11.5–15.5)
WBC: 7.3 10*3/uL (ref 4.0–10.5)

## 2015-09-30 LAB — URINALYSIS, ROUTINE W REFLEX MICROSCOPIC
Bilirubin Urine: NEGATIVE
Glucose, UA: NEGATIVE mg/dL
Ketones, ur: NEGATIVE mg/dL
Nitrite: NEGATIVE
PROTEIN: 30 mg/dL — AB
SPECIFIC GRAVITY, URINE: 1.025 (ref 1.005–1.030)
pH: 6 (ref 5.0–8.0)

## 2015-09-30 LAB — WET PREP, GENITAL
Sperm: NONE SEEN
Trich, Wet Prep: NONE SEEN

## 2015-09-30 LAB — URINE MICROSCOPIC-ADD ON

## 2015-09-30 LAB — POCT PREGNANCY, URINE: Preg Test, Ur: POSITIVE — AB

## 2015-09-30 LAB — HCG, QUANTITATIVE, PREGNANCY: HCG, BETA CHAIN, QUANT, S: 3580 m[IU]/mL — AB (ref <5)

## 2015-09-30 NOTE — MAU Note (Signed)
Had pos upt yesterday. Last night saw some pink on tissue when wiped. Tonight had to red bleeding on tissue when wiped. No pain. LMP 11/13 but only lasted 2 days when usually lasts 5 days.

## 2015-09-30 NOTE — MAU Provider Note (Signed)
Chief Complaint: Vaginal Bleeding   First Provider Initiated Contact with Patient 09/30/15 2154      SUBJECTIVE HPI: Cynthia Valenzuela is a 19 y.o. G2P0010 at [redacted]w[redacted]d by LMP who presents to maternity admissions reporting onset of pink vaginal bleeding yesterday progressing to dark red light bleeding 1 hour ago.  She reports she is wearing a pad but not much blood is on the pad.   She denies abdomina pain, vaginal itching/burning, urinary symptoms, h/a, dizziness, n/v, or fever/chills.     Vaginal Bleeding The patient's primary symptoms include vaginal bleeding. The patient's pertinent negatives include no pelvic pain or vaginal discharge. This is a new problem. The current episode started yesterday. The problem occurs constantly. The problem has been gradually worsening. The patient is experiencing no pain. She is pregnant. Pertinent negatives include no abdominal pain, back pain, chills, constipation, diarrhea, dysuria, fever, flank pain, frequency, headaches, nausea, urgency or vomiting. The vaginal discharge was normal. The vaginal bleeding is lighter than menses. She has not been passing clots. She has not been passing tissue. Nothing aggravates the symptoms. She has tried nothing for the symptoms.    History reviewed. No pertinent past medical history. History reviewed. No pertinent past surgical history. Social History   Social History  . Marital Status: Single    Spouse Name: N/A  . Number of Children: N/A  . Years of Education: N/A   Occupational History  . Not on file.   Social History Main Topics  . Smoking status: Current Some Day Smoker    Last Attempt to Quit: 04/22/2015  . Smokeless tobacco: Never Used  . Alcohol Use: No  . Drug Use: No     Comment: 04/22/2015 quit using marijuana   . Sexual Activity: Yes    Birth Control/ Protection: None   Other Topics Concern  . Not on file   Social History Narrative   ** Merged History Encounter **       No current  facility-administered medications on file prior to encounter.   Current Outpatient Prescriptions on File Prior to Encounter  Medication Sig Dispense Refill  . ibuprofen (ADVIL,MOTRIN) 600 MG tablet Take 1 tablet (600 mg total) by mouth every 6 (six) hours as needed. (Patient not taking: Reported on 09/30/2015) 30 tablet 0  . Prenatal Vit-Fe Fumarate-FA (PRENATAL COMPLETE) 14-0.4 MG TABS Take 2 tablets by mouth daily. (Patient not taking: Reported on 09/30/2015) 60 each 10   No Known Allergies  ROS:  Review of Systems  Constitutional: Negative for fever, chills and fatigue.  Respiratory: Negative for shortness of breath.   Cardiovascular: Negative for chest pain.  Gastrointestinal: Negative for nausea, vomiting, abdominal pain, diarrhea and constipation.  Genitourinary: Positive for vaginal bleeding. Negative for dysuria, urgency, frequency, flank pain, vaginal discharge, difficulty urinating, vaginal pain and pelvic pain.  Musculoskeletal: Negative for back pain.  Neurological: Negative for dizziness and headaches.  Psychiatric/Behavioral: Negative.      I have reviewed patient's Past Medical Hx, Surgical Hx, Family Hx, Social Hx, medications and allergies.   Physical Exam   Patient Vitals for the past 24 hrs:  BP Temp Pulse Resp Height Weight  09/30/15 2339 148/90 mmHg - 110 18 - -  09/30/15 2114 103/70 mmHg 98 F (36.7 C) 93 18 5\' 5"  (1.651 m) 291 lb 12.8 oz (132.36 kg)   Constitutional: Well-developed, well-nourished female in no acute distress.  Cardiovascular: normal rate Respiratory: normal effort GI: Abd soft, non-tender. Pos BS x 4 MS: Extremities nontender, no edema,  normal ROM Neurologic: Alert and oriented x 4.  GU: Neg CVAT.  PELVIC EXAM: Cervix pink, visually closed, without lesion, small amount dark red bleeding, vaginal walls and external genitalia normal Bimanual exam: Cervix 0/long/high, firm, anterior, neg CMT, uterus and adnexa difficult to palpate r/t body  habitus but no palpable enlargement or masses     LAB RESULTS Results for orders placed or performed during the hospital encounter of 09/30/15 (from the past 24 hour(s))  Urinalysis, Routine w reflex microscopic (not at [facility])     Status: Abnormal   Collection Time: 09/30/15  9:20 PM  Result Value Ref Range   Color, Urine ORANGE (A) YELLOW   APPearance CLEAR CLEAR   Specific Gravity, Urine 1.025 1.005 - 1.030   pH 6.0 5.0 - 8.0   Glucose, UA NEGATIVE NEGATIVE mg/dL   Hgb urine dipstick LARGE (A) NEGATIVE   Bilirubin Urine NEGATIVE NEGATIVE   Ketones, ur NEGATIVE NEGATIVE mg/dL   Protein, ur 30 (A) NEGATIVE mg/dL   Nitrite NEGATIVE NEGATIVE   Leukocytes, UA SMALL (A) NEGATIVE  Urine microscopic-add on     Status: Abnormal   Collection Time: 09/30/15  9:20 PM  Result Value Ref Range   Squamous Epithelial / LPF 0-5 (A) NONE SEEN   WBC, UA 0-5 0 - 5 WBC/hpf   RBC / HPF TOO NUMEROUS TO COUNT 0 - 5 RBC/hpf   Bacteria, UA FEW (A) NONE SEEN  Pregnancy, urine POC     Status: Abnormal   Collection Time: 09/30/15  9:35 PM  Result Value Ref Range   Preg Test, Ur POSITIVE (A) NEGATIVE  CBC     Status: Abnormal   Collection Time: 09/30/15  9:39 PM  Result Value Ref Range   WBC 7.3 4.0 - 10.5 K/uL   RBC 4.04 3.87 - 5.11 MIL/uL   Hemoglobin 11.5 (L) 12.0 - 15.0 g/dL   HCT 16.1 (L) 09.6 - 04.5 %   MCV 87.6 78.0 - 100.0 fL   MCH 28.5 26.0 - 34.0 pg   MCHC 32.5 30.0 - 36.0 g/dL   RDW 40.9 81.1 - 91.4 %   Platelets 346 150 - 400 K/uL  hCG, quantitative, pregnancy     Status: Abnormal   Collection Time: 09/30/15  9:39 PM  Result Value Ref Range   hCG, Beta Chain, Quant, S 3580 (H) <5 mIU/mL  Wet prep, genital     Status: Abnormal   Collection Time: 09/30/15  9:57 PM  Result Value Ref Range   Yeast Wet Prep HPF POC PRESENT (A) NONE SEEN   Trich, Wet Prep NONE SEEN NONE SEEN   Clue Cells Wet Prep HPF POC PRESENT (A) NONE SEEN   WBC, Wet Prep HPF POC MODERATE (A) NONE SEEN   Sperm NONE  SEEN     --/--/A POS (07/20 1750)  IMAGING US Ob Comp Less 14 Wks  09/30/2015  CLINICAL DATA:  Acute onset of vaginal bleeding.  Initial encounter. EXAM: OBSTETRIC <14 WK [place] AND TRANSVAGINAL OB US TECHNIQUE: Both transabdominal and transvaginal ultrasound examinations were performed for complete evaluation of the gestation as well as the maternal uterus, adnexal regions, and pelvic cul-de-sac. Transvaginal technique was performed to assess early pregnancy. COMPARISON:  None. FINDINGS: Intrauterine gestational sac: Visualized/normal in shape. Yolk sac:  Yes Embryo:  No Cardiac Activity: N/A MSD: 6.6  mm   5 w   3  d Subchorionic hemorrhage: A small amount of subchorionic hemorrhage is noted. Maternal uterus/adnexae: The uterus is otherwise  unremarkable in appearance. The ovaries are unremarkable in appearance. The right ovary measures 3.7 x 2.2 x 3.7 cm, while the left ovary measures 3.1 x 1.8 x 2.1 cm. No suspicious adnexal masses are seen; there is no evidence for ovarian torsion. Trace free fluid is seen within the pelvic cul-de-sac. IMPRESSION: 1. Single intrauterine gestational sac noted, with a mean sac diameter of 7 mm, corresponding to a gestational age of [redacted] weeks 3 days. This matches the gestational age of [redacted] weeks 5 days by LMP, reflecting an estimated date of delivery of May 27, 2016. A yolk sac is visualized. The embryo is not yet seen, within normal limits. 2. Small amount of subchorionic hemorrhage noted. Electronically Signed   By: Roanna RaiderJeffery  Chang M.D.   On: 09/30/2015 22:45   [place] Ob Transvaginal  09/30/2015  CLINICAL DATA:  Acute onset of vaginal bleeding.  Initial encounter. EXAM: OBSTETRIC <14 WK US AND TRANSVAGINAL OB US TECHNIQUE: Both transabdominal and transvaginal ultrasound examinations were performed for complete evaluation of the gestation as well as the maternal uterus, adnexal regions, and pelvic cul-de-sac. Transvaginal technique was performed to assess early pregnancy.  COMPARISON:  None. FINDINGS: Intrauterine gestational sac: Visualized/normal in shape. Yolk sac:  Yes Embryo:  No Cardiac Activity: N/A MSD: 6.6  mm   5 w   3  d Subchorionic hemorrhage: A small amount of subchorionic hemorrhage is noted. Maternal uterus/adnexae: The uterus is otherwise unremarkable in appearance. The ovaries are unremarkable in appearance. The right ovary measures 3.7 x 2.2 x 3.7 cm, while the left ovary measures 3.1 x 1.8 x 2.1 cm. No suspicious adnexal masses are seen; there is no evidence for ovarian torsion. Trace free fluid is seen within the pelvic cul-de-sac. IMPRESSION: 1. Single intrauterine gestational sac noted, with a mean sac diameter of 7 mm, corresponding to a gestational age of [redacted] weeks 3 days. This matches the gestational age of [redacted] weeks 5 days by LMP, reflecting an estimated date of delivery of May 27, 2016. A yolk sac is visualized. The embryo is not yet seen, within normal limits. 2. Small amount of subchorionic hemorrhage noted. Electronically Signed   By: Roanna RaiderJeffery  Chang M.D.   On: 09/30/2015 22:45    MAU Management/MDM: Ordered labs and reviewed results.  IUP present on today's ultrasound, but early without fetal pole.  Small SCH noted.  Discussed these findings with pt.  Plan for f/u ultrasound in 2 weeks for viability, then pt to follow up in [facility] or at health dept.  Pt stable at time of discharge.  ASSESSMENT 1. Normal IUP (intrauterine pregnancy) on prenatal ultrasound, first trimester   2. Vaginal bleeding in pregnancy, first trimester   3. Subchorionic hemorrhage in first trimester     PLAN Discharge home with bleeding precautions Outpatient US ordered in 2 weeks Pt to start prenatal care as soon as possible Return to MAU as needed for emergencies    Medication List    STOP taking these medications        oxyCODONE-acetaminophen 5-325 MG tablet  Commonly known as:  PERCOCET/ROXICET      TAKE these medications        ibuprofen 600 MG tablet   Commonly known as:  ADVIL,MOTRIN  Take 1 tablet (600 mg total) by mouth every 6 (six) hours as needed.     PRENATAL COMPLETE 14-0.4 MG Tabs  Take 2 tablets by mouth daily.       Follow-up Information    Please follow up.  Why:  Follow up soon with a primary care provider, Return to MAU as needed for emergencies      Sharen Counter Certified Nurse-Midwife 10/01/2015  3:20 AM

## 2015-09-30 NOTE — Discharge Instructions (Signed)

## 2015-10-01 LAB — HIV ANTIBODY (ROUTINE TESTING W REFLEX): HIV SCREEN 4TH GENERATION: NONREACTIVE

## 2015-10-05 ENCOUNTER — Encounter (HOSPITAL_COMMUNITY): Payer: Self-pay

## 2015-10-05 ENCOUNTER — Inpatient Hospital Stay (HOSPITAL_COMMUNITY)
Admission: AD | Admit: 2015-10-05 | Discharge: 2015-10-05 | Disposition: A | Discharge status: Home / Self Care | Payer: Self-pay | Source: Ambulatory Visit | Attending: Obstetrics & Gynecology | Admitting: Obstetrics & Gynecology

## 2015-10-05 DIAGNOSIS — O468X9 Other antepartum hemorrhage, unspecified trimester: Secondary | ICD-10-CM

## 2015-10-05 DIAGNOSIS — O418X90 Other specified disorders of amniotic fluid and membranes, unspecified trimester, not applicable or unspecified: Secondary | ICD-10-CM

## 2015-10-05 DIAGNOSIS — Z3A01 Less than 8 weeks gestation of pregnancy: Secondary | ICD-10-CM | POA: Insufficient documentation

## 2015-10-05 DIAGNOSIS — O209 Hemorrhage in early pregnancy, unspecified: Secondary | ICD-10-CM | POA: Insufficient documentation

## 2015-10-05 DIAGNOSIS — O468X1 Other antepartum hemorrhage, first trimester: Secondary | ICD-10-CM

## 2015-10-05 LAB — CBC
HEMATOCRIT: 35.2 % — AB (ref 36.0–46.0)
Hemoglobin: 11.6 g/dL — ABNORMAL LOW (ref 12.0–15.0)
MCH: 28.5 pg (ref 26.0–34.0)
MCHC: 33 g/dL (ref 30.0–36.0)
MCV: 86.5 fL (ref 78.0–100.0)
Platelets: 349 10*3/uL (ref 150–400)
RBC: 4.07 MIL/uL (ref 3.87–5.11)
RDW: 14.4 % (ref 11.5–15.5)
WBC: 7.9 10*3/uL (ref 4.0–10.5)

## 2015-10-05 LAB — HCG, QUANTITATIVE, PREGNANCY: HCG, BETA CHAIN, QUANT, S: 12683 m[IU]/mL — AB (ref <5)

## 2015-10-05 NOTE — MAU Provider Note (Signed)
MAU HISTORY AND PHYSICAL  Chief Complaint:  Vaginal Bleeding   Cynthia Valenzuela is a 19 y.o.  G3P0010 with IUP at 47104w3d presenting for Vaginal Bleeding  12/23: presented here with vaginal bleeding. First noticed that evening. Spotting. Resolved the next day. U/s showed gestational and yolk sac. hcg 3580 at that time.  Today began bleeding again, same. Spotting. No pain. Passed one medium-sized clot.      Past Medical History  Diagnosis Date  . Medical history non-contributory     Past Surgical History  Procedure Laterality Date  . No past surgeries      History reviewed. No pertinent family history.  Social History  Substance Use Topics  . Smoking status: Current Some Day Smoker    Last Attempt to Quit: 04/22/2015  . Smokeless tobacco: Never Used  . Alcohol Use: No    No Known Allergies  Prescriptions prior to admission  Medication Sig Dispense Refill Last Dose  . ibuprofen (ADVIL,MOTRIN) 600 MG tablet Take 1 tablet (600 mg total) by mouth every 6 (six) hours as needed. (Patient not taking: Reported on 09/30/2015) 30 tablet 0 Not Taking at Unknown time  . Prenatal Vit-Fe Fumarate-FA (PRENATAL COMPLETE) 14-0.4 MG TABS Take 2 tablets by mouth daily. (Patient not taking: Reported on 09/30/2015) 60 each 10 Not Taking at Unknown time    Review of Systems - Negative except for what is mentioned in HPI.  Physical Exam  Blood pressure 108/51, pulse 99, temperature 98.2 F (36.8 C), resp. rate 18, last menstrual period 08/21/2015, unknown if currently breastfeeding. GENERAL: Well-developed, well-nourished female in no acute distress.  LUNGS: Clear to auscultation bilaterally.  HEART: Regular rate and rhythm. ABDOMEN: Soft, nontender, nondistended, .  EXTREMITIES: Nontender, no edema, 2+ distal pulses. GU: normal cervix, small amount blood-tinged mucous in vagina and from closed cervical os   Labs: Results for orders placed or performed during the hospital encounter of  10/05/15 (from the past 24 hour(s))  hCG, quantitative, pregnancy   Collection Time: 10/05/15  8:08 PM  Result Value Ref Range   hCG, Beta Chain, Quant, S 12683 (H) <5 mIU/mL  CBC   Collection Time: 10/05/15  8:08 PM  Result Value Ref Range   WBC 7.9 4.0 - 10.5 K/uL   RBC 4.07 3.87 - 5.11 MIL/uL   Hemoglobin 11.6 (L) 12.0 - 15.0 g/dL   HCT 16.135.2 (L) 09.636.0 - 04.546.0 %   MCV 86.5 78.0 - 100.0 fL   MCH 28.5 26.0 - 34.0 pg   MCHC 33.0 30.0 - 36.0 g/dL   RDW 40.914.4 81.111.5 - 91.415.5 %   Platelets 349 150 - 400 K/uL    Imaging Studies:  [place] Ob Comp Less 14 Wks  09/30/2015  CLINICAL DATA:  Acute onset of vaginal bleeding.  Initial encounter. EXAM: OBSTETRIC <14 WK US AND TRANSVAGINAL OB US TECHNIQUE: Both transabdominal and transvaginal ultrasound examinations were performed for complete evaluation of the gestation as well as the maternal uterus, adnexal regions, and pelvic cul-de-sac. Transvaginal technique was performed to assess early pregnancy. COMPARISON:  None. FINDINGS: Intrauterine gestational sac: Visualized/normal in shape. Yolk sac:  Yes Embryo:  No Cardiac Activity: N/A MSD: 6.6  mm   5 w   3  d Subchorionic hemorrhage: A small amount of subchorionic hemorrhage is noted. Maternal uterus/adnexae: The uterus is otherwise unremarkable in appearance. The ovaries are unremarkable in appearance. The right ovary measures 3.7 x 2.2 x 3.7 cm, while the left ovary measures 3.1 x 1.8 x 2.1  cm. No suspicious adnexal masses are seen; there is no evidence for ovarian torsion. Trace free fluid is seen within the pelvic cul-de-sac. IMPRESSION: 1. Single intrauterine gestational sac noted, with a mean sac diameter of 7 mm, corresponding to a gestational age of [redacted] weeks 3 days. This matches the gestational age of [redacted] weeks 5 days by LMP, reflecting an estimated date of delivery of May 27, 2016. A yolk sac is visualized. The embryo is not yet seen, within normal limits. 2. Small amount of subchorionic hemorrhage noted.  Electronically Signed   By: Roanna Raider M.D.   On: 09/30/2015 22:45   US Ob Transvaginal  09/30/2015  CLINICAL DATA:  Acute onset of vaginal bleeding.  Initial encounter. EXAM: OBSTETRIC <14 WK [place] AND TRANSVAGINAL OB US TECHNIQUE: Both transabdominal and transvaginal ultrasound examinations were performed for complete evaluation of the gestation as well as the maternal uterus, adnexal regions, and pelvic cul-de-sac. Transvaginal technique was performed to assess early pregnancy. COMPARISON:  None. FINDINGS: Intrauterine gestational sac: Visualized/normal in shape. Yolk sac:  Yes Embryo:  No Cardiac Activity: N/A MSD: 6.6  mm   5 w   3  d Subchorionic hemorrhage: A small amount of subchorionic hemorrhage is noted. Maternal uterus/adnexae: The uterus is otherwise unremarkable in appearance. The ovaries are unremarkable in appearance. The right ovary measures 3.7 x 2.2 x 3.7 cm, while the left ovary measures 3.1 x 1.8 x 2.1 cm. No suspicious adnexal masses are seen; there is no evidence for ovarian torsion. Trace free fluid is seen within the pelvic cul-de-sac. IMPRESSION: 1. Single intrauterine gestational sac noted, with a mean sac diameter of 7 mm, corresponding to a gestational age of [redacted] weeks 3 days. This matches the gestational age of [redacted] weeks 5 days by LMP, reflecting an estimated date of delivery of May 27, 2016. A yolk sac is visualized. The embryo is not yet seen, within normal limits. 2. Small amount of subchorionic hemorrhage noted. Electronically Signed   By: Roanna Raider M.D.   On: 09/30/2015 22:45    Assessment: Cynthia Valenzuela is  19 y.o. G3P0010 at [redacted]w[redacted]d presents with first tri vaginal bleeding. Early iup seen on u/s 5 days ago. Today with more bleeding but not significant. Cervix closed on exam, no products at os or in vagina, and no cramping. H/h stable, hcg rising. Likely old or continued bleeding from subchorionic hematoma seen on u/s 5 days ago. will re-check hcg in 5 days to follow  viability. Discussed w/ Dr. Despina Hidden.  Plan: - f/u 5 days hcg - bleeding and infection return precautions  Silvano Bilis 12/28/20169:14 PM

## 2015-10-05 NOTE — MAU Note (Signed)
Pt presents to MAU with complaints of vaginal bleeding started today pt had an episode of vaginal bleeding that stopped a couple of days ago. States she was told she had a subchronic hemorrhage on her last U/S

## 2015-10-05 NOTE — MAU Note (Signed)
Patient reports bleeding has decreased significantly. Patient brought in a picture of the clots she previously passed.

## 2015-10-07 LAB — GC/CHLAMYDIA PROBE AMP (~~LOC~~) NOT AT ARMC
Chlamydia: NEGATIVE
Neisseria Gonorrhea: NEGATIVE

## 2015-10-09 NOTE — L&D Delivery Note (Signed)
Delivery Note Pt continued to have contractions despite Magnesium sulfate, procardia and Terbutaline. A bedside sono revealed that the fetus was still in the uterus but, there was hour glassing of the amniotic sac into the vagina. An attempt to remove the cerclage would have resulted in rupture of the amniotic sac.  The fetus was noted to be breech.   Pt had an abrupt change in sx and at 1:38 PM a viable female was delivered en caul via Vaginal, Spontaneous Delivery (Presentation:breech).  APGAR: 2, 5; weight 1 lb 6.6 oz (640 g).  The NICU team was present at delivery.  Placenta status: delivered in 3 peices, Spontaneous.  Cord: 3 vessels with the following complications: .  Anesthesia: None  Episiotomy: None Lacerations:  None Est. Blood Loss (mL):  200cc  I attempted to locate and remove the cervical cerclage post delivery but, could not identify the knot.   Pt was instructed to f/u 4-6 weeks for removal of cerclage at her PP visit.   Mom to postpartum.  Baby to NICU.  Valenzuela, Cynthia Tew 02/09/2016, 2:40 PM

## 2015-10-11 ENCOUNTER — Ambulatory Visit (HOSPITAL_COMMUNITY)
Admission: RE | Admit: 2015-10-11 | Discharge: 2015-10-11 | Disposition: A | Discharge status: Home / Self Care | Payer: Medicaid Other | Source: Ambulatory Visit | Attending: Advanced Practice Midwife | Admitting: Advanced Practice Midwife

## 2015-10-11 ENCOUNTER — Ambulatory Visit (INDEPENDENT_AMBULATORY_CARE_PROVIDER_SITE_OTHER): Payer: Self-pay | Admitting: Family Medicine

## 2015-10-11 ENCOUNTER — Encounter: Payer: Self-pay | Admitting: Family Medicine

## 2015-10-11 ENCOUNTER — Encounter: Payer: Self-pay | Admitting: Obstetrics & Gynecology

## 2015-10-11 DIAGNOSIS — O283 Abnormal ultrasonic finding on antenatal screening of mother: Secondary | ICD-10-CM | POA: Diagnosis not present

## 2015-10-11 DIAGNOSIS — O99211 Obesity complicating pregnancy, first trimester: Secondary | ICD-10-CM

## 2015-10-11 DIAGNOSIS — Z3490 Encounter for supervision of normal pregnancy, unspecified, unspecified trimester: Secondary | ICD-10-CM

## 2015-10-11 DIAGNOSIS — Z3A01 Less than 8 weeks gestation of pregnancy: Secondary | ICD-10-CM | POA: Diagnosis not present

## 2015-10-11 DIAGNOSIS — O3680X0 Pregnancy with inconclusive fetal viability, not applicable or unspecified: Secondary | ICD-10-CM | POA: Insufficient documentation

## 2015-10-11 DIAGNOSIS — O209 Hemorrhage in early pregnancy, unspecified: Secondary | ICD-10-CM

## 2015-10-11 DIAGNOSIS — O418X10 Other specified disorders of amniotic fluid and membranes, first trimester, not applicable or unspecified: Secondary | ICD-10-CM

## 2015-10-11 DIAGNOSIS — Z3481 Encounter for supervision of other normal pregnancy, first trimester: Secondary | ICD-10-CM

## 2015-10-11 DIAGNOSIS — O468X1 Other antepartum hemorrhage, first trimester: Secondary | ICD-10-CM

## 2015-10-11 NOTE — Progress Notes (Signed)
Patient ID: Cynthia Valenzuela, female   DOB: [birth year], 20 y.o.   MRN: [id number]   CLINIC ENCOUNTER NOTE  History:  20 y.o. G3P0010 here today for follow up US. Patient seen in MAU and ordered for outpatient US. Marland Kitchen. She denies any abnormal vaginal discharge, bleeding, pelvic pain or other concerns.   Past Medical History  Diagnosis Date  . Medical history non-contributory     Past Surgical History  Procedure Laterality Date  . No past surgeries      The following portions of the patient's history were reviewed and updated as appropriate: allergies, current medications, past family history, past medical history, past social history, past surgical history and problem list.     Review of Systems:  Pertinent items noted in HPI and remainder of comprehensive ROS otherwise negative.  Objective:  Physical Exam LMP 08/21/2015 CONSTITUTIONAL: Well-developed, well-nourished female in no acute distress.  HENT:  NCAT EYES:  No scleral icterus.  SKIN: Skin is warm and dry.  NEUROLGIC: Alert and oriented to person, place, and time. Normal reflexes, muscle tone coordination. No cranial nerve deficit noted. PSYCHIATRIC: Normal mood and affect. Marland Kitchen. CARDIOVASCULAR: Normal heart rate noted RESPIRATORY: Effort and breath sounds normal, no problems with respiration noted ABDOMEN: Soft, morbidly obese PELVIC: deferred MUSCULOSKELETAL: Normal range of motion. No edema noted.  Labs and Imaging [place] Ob Comp Less 14 Wks  09/30/2015  CLINICAL DATA:  Acute onset of vaginal bleeding.  Initial encounter. EXAM: OBSTETRIC <14 WK US AND TRANSVAGINAL OB US TECHNIQUE: Both transabdominal and transvaginal ultrasound examinations were performed for complete evaluation of the gestation as well as the maternal uterus, adnexal regions, and pelvic cul-de-sac. Transvaginal technique was performed to assess early pregnancy. COMPARISON:  None. FINDINGS: Intrauterine gestational sac: Visualized/normal in shape. Yolk sac:   Yes Embryo:  No Cardiac Activity: N/A MSD: 6.6  mm   5 w   3  d Subchorionic hemorrhage: A small amount of subchorionic hemorrhage is noted. Maternal uterus/adnexae: The uterus is otherwise unremarkable in appearance. The ovaries are unremarkable in appearance. The right ovary measures 3.7 x 2.2 x 3.7 cm, while the left ovary measures 3.1 x 1.8 x 2.1 cm. No suspicious adnexal masses are seen; there is no evidence for ovarian torsion. Trace free fluid is seen within the pelvic cul-de-sac. IMPRESSION: 1. Single intrauterine gestational sac noted, with a mean sac diameter of 7 mm, corresponding to a gestational age of [redacted] weeks 3 days. This matches the gestational age of [redacted] weeks 5 days by LMP, reflecting an estimated date of delivery of May 27, 2016. A yolk sac is visualized. The embryo is not yet seen, within normal limits. 2. Small amount of subchorionic hemorrhage noted. Electronically Signed   By: Roanna RaiderJeffery  Chang M.D.   On: 09/30/2015 22:45   [place] Ob Transvaginal  10/11/2015  CLINICAL DATA:  Pregnancy with inconclusive viability EXAM: TRANSVAGINAL OB ULTRASOUND TECHNIQUE: Transvaginal ultrasound was performed for complete evaluation of the gestation as well as the maternal uterus, adnexal regions, and pelvic cul-de-sac. COMPARISON:  09/30/2015 FINDINGS: Intrauterine gestational sac:  Visualized/normal in shape. Yolk sac:  Present Embryo:  Present Cardiac Activity: Present Heart Rate: 121 bpm CRL:   6.4  mm   6 w 3 d                  US EDC: 06/02/2016 Subchorionic hemorrhage:  Small subchronic hemorrhage. Maternal uterus/adnexae: Bilateral ovaries are within normal limits. No free fluid. IMPRESSION: Single live intrauterine gestation with estimated gestational  age [redacted] weeks 3 days by crown-rump length. Electronically Signed   By: Charline Bills M.D.   On: 10/11/2015 14:59   US Ob Transvaginal  09/30/2015  CLINICAL DATA:  Acute onset of vaginal bleeding.  Initial encounter. EXAM: OBSTETRIC <14 WK [place] AND  TRANSVAGINAL OB US TECHNIQUE: Both transabdominal and transvaginal ultrasound examinations were performed for complete evaluation of the gestation as well as the maternal uterus, adnexal regions, and pelvic cul-de-sac. Transvaginal technique was performed to assess early pregnancy. COMPARISON:  None. FINDINGS: Intrauterine gestational sac: Visualized/normal in shape. Yolk sac:  Yes Embryo:  No Cardiac Activity: N/A MSD: 6.6  mm   5 w   3  d Subchorionic hemorrhage: A small amount of subchorionic hemorrhage is noted. Maternal uterus/adnexae: The uterus is otherwise unremarkable in appearance. The ovaries are unremarkable in appearance. The right ovary measures 3.7 x 2.2 x 3.7 cm, while the left ovary measures 3.1 x 1.8 x 2.1 cm. No suspicious adnexal masses are seen; there is no evidence for ovarian torsion. Trace free fluid is seen within the pelvic cul-de-sac. IMPRESSION: 1. Single intrauterine gestational sac noted, with a mean sac diameter of 7 mm, corresponding to a gestational age of [redacted] weeks 3 days. This matches the gestational age of [redacted] weeks 5 days by LMP, reflecting an estimated date of delivery of May 27, 2016. A yolk sac is visualized. The embryo is not yet seen, within normal limits. 2. Small amount of subchorionic hemorrhage noted. Electronically Signed   By: Roanna Raider M.D.   On: 09/30/2015 22:45    Assessment & Plan:   1. Intrauterine pregnancy - Early pregnancy - discussed typical first trimester precautions - Reviewed miscarriage precautions - Discussed need to establish care for pregnancy, patient has list of providers.  - Establish PNC in 4-6 weeks.   Return in about 4 weeks (around 11/08/2015) for Inital PNV.  Total face-to-face time with patient: 20 minutes. Over 50% of encounter was spent on counseling and coordination of care.

## 2015-10-11 NOTE — Patient Instructions (Addendum)
First Trimester of Pregnancy The first trimester of pregnancy is from week 1 until the end of week 12 (months 1 through 3). A week after a sperm fertilizes an egg, the egg will implant on the wall of the uterus. This embryo will begin to develop into a baby. Genes from you and your partner are forming the baby. The female genes determine whether the baby is a boy or a girl. At 6-8 weeks, the eyes and face are formed, and the heartbeat can be seen on ultrasound. At the end of 12 weeks, all the baby's organs are formed.  Now that you are pregnant, you will want to do everything you can to have a healthy baby. Two of the most important things are to get good prenatal care and to follow your health care provider's instructions. Prenatal care is all the medical care you receive before the baby's birth. This care will help prevent, find, and treat any problems during the pregnancy and childbirth. BODY CHANGES Your body goes through many changes during pregnancy. The changes vary from woman to woman.   You may gain or lose a couple of pounds at first.  You may feel sick to your stomach (nauseous) and throw up (vomit). If the vomiting is uncontrollable, call your health care provider.  You may tire easily.  You may develop headaches that can be relieved by medicines approved by your health care provider.  You may urinate more often. Painful urination may mean you have a bladder infection.  You may develop heartburn as a result of your pregnancy.  You may develop constipation because certain hormones are causing the muscles that push waste through your intestines to slow down.  You may develop hemorrhoids or swollen, bulging veins (varicose veins).  Your breasts may begin to grow larger and become tender. Your nipples may stick out more, and the tissue that surrounds them (areola) may become darker.  Your gums may bleed and may be sensitive to brushing and flossing.  Dark spots or blotches  (chloasma, mask of pregnancy) may develop on your face. This will likely fade after the baby is born.  Your menstrual periods will stop.  You may have a loss of appetite.  You may develop cravings for certain kinds of food.  You may have changes in your emotions from day to day, such as being excited to be pregnant or being concerned that something may go wrong with the pregnancy and baby.  You may have more vivid and strange dreams.  You may have changes in your hair. These can include thickening of your hair, rapid growth, and changes in texture. Some women also have hair loss during or after pregnancy, or hair that feels dry or thin. Your hair will most likely return to normal after your baby is born. WHAT TO EXPECT AT YOUR PRENATAL VISITS During a routine prenatal visit:  You will be weighed to make sure you and the baby are growing normally.  Your blood pressure will be taken.  Your abdomen will be measured to track your baby's growth.  The fetal heartbeat will be listened to starting around week 10 or 12 of your pregnancy.  Test results from any previous visits will be discussed. Your health care provider may ask you:  How you are feeling.  If you are feeling the baby move.  If you have had any abnormal symptoms, such as leaking fluid, bleeding, severe headaches, or abdominal cramping.  If you are using any tobacco products,   including cigarettes, chewing tobacco, and electronic cigarettes.  If you have any questions. Other tests that may be performed during your first trimester include:  Blood tests to find your blood type and to check for the presence of any previous infections. They will also be used to check for low iron levels (anemia) and Rh antibodies. Later in the pregnancy, blood tests for diabetes will be done along with other tests if problems develop.  Urine tests to check for infections, diabetes, or protein in the urine.  An ultrasound to confirm the  proper growth and development of the baby.  An amniocentesis to check for possible genetic problems.  Fetal screens for spina bifida and Down syndrome.  You may need other tests to make sure you and the baby are doing well.  HIV (human immunodeficiency virus) testing. Routine prenatal testing includes screening for HIV, unless you choose not to have this test. HOME CARE INSTRUCTIONS  Medicines  Follow your health care provider's instructions regarding medicine use. Specific medicines may be either safe or unsafe to take during pregnancy.  Take your prenatal vitamins as directed.  If you develop constipation, try taking a stool softener if your health care provider approves. Diet  Eat regular, well-balanced meals. Choose a variety of foods, such as meat or vegetable-based protein, fish, milk and low-fat dairy products, vegetables, fruits, and whole grain breads and cereals. Your health care provider will help you determine the amount of weight gain that is right for you.  Avoid raw meat and uncooked cheese. These carry germs that can cause birth defects in the baby.  Eating four or five small meals rather than three large meals a day may help relieve nausea and vomiting. If you start to feel nauseous, eating a few soda crackers can be helpful. Drinking liquids between meals instead of during meals also seems to help nausea and vomiting.  If you develop constipation, eat more high-fiber foods, such as fresh vegetables or fruit and whole grains. Drink enough fluids to keep your urine clear or pale yellow. Activity and Exercise  Exercise only as directed by your health care provider. Exercising will help you:  Control your weight.  Stay in shape.  Be prepared for labor and delivery.  Experiencing pain or cramping in the lower abdomen or low back is a good sign that you should stop exercising. Check with your health care provider before continuing normal exercises.  Try to avoid  standing for long periods of time. Move your legs often if you must stand in one place for a long time.  Avoid heavy lifting.  Wear low-heeled shoes, and practice good posture.  You may continue to have sex unless your health care provider directs you otherwise. Relief of Pain or Discomfort  Wear a good support bra for breast tenderness.   Take warm sitz baths to soothe any pain or discomfort caused by hemorrhoids. Use hemorrhoid cream if your health care provider approves.   Rest with your legs elevated if you have leg cramps or low back pain.  If you develop varicose veins in your legs, wear support hose. Elevate your feet for 15 minutes, 3-4 times a day. Limit salt in your diet. Prenatal Care  Schedule your prenatal visits by the twelfth week of pregnancy. They are usually scheduled monthly at first, then more often in the last 2 months before delivery.  Write down your questions. Take them to your prenatal visits.  Keep all your prenatal visits as directed by your   health care provider. Safety  Wear your seat belt at all times when driving.  Make a list of emergency phone numbers, including numbers for family, friends, the hospital, and police and fire departments. General Tips  Ask your health care provider for a referral to a local prenatal education class. Begin classes no later than at the beginning of month 6 of your pregnancy.  Ask for help if you have counseling or nutritional needs during pregnancy. Your health care provider can offer advice or refer you to specialists for help with various needs.  Do not use hot tubs, steam rooms, or saunas.  Do not douche or use tampons or scented sanitary pads.  Do not cross your legs for long periods of time.  Avoid cat litter boxes and soil used by cats. These carry germs that can cause birth defects in the baby and possibly loss of the fetus by miscarriage or stillbirth.  Avoid all smoking, herbs, alcohol, and medicines  not prescribed by your health care provider. Chemicals in these affect the formation and growth of the baby.  Do not use any tobacco products, including cigarettes, chewing tobacco, and electronic cigarettes. If you need help quitting, ask your health care provider. You may receive counseling support and other resources to help you quit.  Schedule a dentist appointment. At home, brush your teeth with a soft toothbrush and be gentle when you floss. SEEK MEDICAL CARE IF:   You have dizziness.  You have mild pelvic cramps, pelvic pressure, or nagging pain in the abdominal area.  You have persistent nausea, vomiting, or diarrhea.  You have a bad smelling vaginal discharge.  You have pain with urination.  You notice increased swelling in your face, hands, legs, or ankles. SEEK IMMEDIATE MEDICAL CARE IF:   You have a fever.  You are leaking fluid from your vagina.  You have spotting or bleeding from your vagina.  You have severe abdominal cramping or pain.  You have rapid weight gain or loss.  You vomit blood or material that looks like coffee grounds.  You are exposed to German measles and have never had them.  You are exposed to fifth disease or chickenpox.  You develop a severe headache.  You have shortness of breath.  You have any kind of trauma, such as from a fall or a car accident.   This information is not intended to replace advice given to you by your health care provider. Make sure you discuss any questions you have with your health care provider.   Document Released: 09/18/2001 Document Revised: 10/15/2014 Document Reviewed: 08/04/2013 Elsevier Interactive Patient Education 2016 [employer]  Safe Medications in Pregnancy   Acne: Benzoyl Peroxide Salicylic Acid  Backache/Headache: Tylenol: 2 regular strength every 4 hours OR              2 Extra strength every 6 hours  Colds/Coughs/Allergies: Benadryl (alcohol free) 25 mg every 6 hours as  needed Breath right strips Claritin Cepacol throat lozenges Chloraseptic throat spray Cold-Eeze- up to three times per day Cough drops, alcohol free Flonase (by prescription only) Guaifenesin Mucinex Robitussin DM (plain only, alcohol free) Saline nasal spray/drops Sudafed (pseudoephedrine) & Actifed ** use only after [redacted] weeks gestation and if you do not have high blood pressure Tylenol Vicks Vaporub Zinc lozenges Zyrtec   Constipation: Colace Ducolax suppositories Fleet enema Glycerin suppositories Metamucil Milk of magnesia Miralax Senokot Smooth move tea  Diarrhea: Kaopectate Imodium A-D  *NO pepto Bismol  Hemorrhoids: Anusol Anusol   HC Preparation H Tucks  Indigestion: Tums Maalox Mylanta Zantac  Pepcid  Insomnia: Benadryl (alcohol free) 25mg every 6 hours as needed Tylenol PM Unisom, no Gelcaps  Leg Cramps: Tums MagGel  Nausea/Vomiting:  Bonine Dramamine Emetrol Ginger extract Sea bands Meclizine  Nausea medication to take during pregnancy:  Unisom (doxylamine succinate 25 mg tablets) Take one tablet daily at bedtime. If symptoms are not adequately controlled, the dose can be increased to a maximum recommended dose of two tablets daily (1/2 tablet in the morning, 1/2 tablet mid-afternoon and one at bedtime). Vitamin B6 100mg tablets. Take one tablet twice a day (up to 200 mg per day).  Skin Rashes: Aveeno products Benadryl cream or 25mg every 6 hours as needed Calamine Lotion 1% cortisone cream  Yeast infection: Gyne-lotrimin 7 Monistat 7   **If taking multiple medications, please check labels to avoid duplicating the same active ingredients **take medication as directed on the label ** Do not exceed 4000 mg of tylenol in 24 hours **Do not take medications that contain aspirin or ibuprofen    

## 2015-11-08 ENCOUNTER — Encounter: Payer: Self-pay | Admitting: Advanced Practice Midwife

## 2015-11-08 ENCOUNTER — Ambulatory Visit (INDEPENDENT_AMBULATORY_CARE_PROVIDER_SITE_OTHER): Payer: Self-pay | Admitting: Advanced Practice Midwife

## 2015-11-08 VITALS — BP 103/66 | HR 84 | Temp 98.5°F | Wt 290.1 lb

## 2015-11-08 DIAGNOSIS — Z3491 Encounter for supervision of normal pregnancy, unspecified, first trimester: Secondary | ICD-10-CM

## 2015-11-08 DIAGNOSIS — Z3490 Encounter for supervision of normal pregnancy, unspecified, unspecified trimester: Secondary | ICD-10-CM

## 2015-11-08 DIAGNOSIS — Z113 Encounter for screening for infections with a predominantly sexual mode of transmission: Secondary | ICD-10-CM

## 2015-11-08 LAB — POCT URINALYSIS DIP (DEVICE)
Bilirubin Urine: NEGATIVE
GLUCOSE, UA: NEGATIVE mg/dL
Hgb urine dipstick: NEGATIVE
Ketones, ur: NEGATIVE mg/dL
Leukocytes, UA: NEGATIVE
NITRITE: NEGATIVE
PH: 6 (ref 5.0–8.0)
Protein, ur: NEGATIVE mg/dL
Specific Gravity, Urine: 1.025 (ref 1.005–1.030)
Urobilinogen, UA: 0.2 mg/dL (ref 0.0–1.0)

## 2015-11-08 NOTE — Progress Notes (Signed)
Initial prenatal education packet given Breastfeeding tip of the week reviewed 1hr gtt, initial prenatal labs today Declines flu

## 2015-11-08 NOTE — Progress Notes (Signed)
New OB, see SmartSet  Subjective:    Cynthia Valenzuela is a G3P0020 [redacted]w[redacted]d being seen today for her first obstetrical visit.  Her obstetrical history is significant for obesity. Patient does intend to breast feed. Pregnancy history fully reviewed.  Patient reports no complaints.  Last [place]: IMPRESSION: Single live intrauterine gestation with estimated gestational age [redacted] weeks 3 days by crown-rump length.   On: 10/11/2015 14:59  Filed Vitals:   11/08/15 0841  BP: 103/66  Pulse: 84  Temp: 98.5 F (36.9 C)  Weight: 290 lb 1.6 oz (131.588 kg)    HISTORY: OB History  Gravida Para Term Preterm AB SAB TAB Ectopic Multiple Living  3 0 0 0 0      # Outcome Date GA Lbr Len/2nd Weight Sex Delivery Anes PTL Lv  3 Current           2 SAB 04/2015          1 TAB 11/2014             Past Medical History  Diagnosis Date  . Medical history non-contributory    Past Surgical History  Procedure Laterality Date  . No past surgeries     Family History  Problem Relation Age of Onset  . Alcohol abuse Father      Exam    Uterus:  Fundal Height: 10 cm   Attempted Doppler heart tones without success. Uterus slightly retroverted  Pelvic Exam:    Perineum: No Hemorrhoids, Normal Perineum   Vulva: Bartholin's, Urethra, Skene's normal   Vagina:  normal discharge   pH:    Cervix: nulliparous appearance   Too young for Pap   Adnexa: normal adnexa and no mass, fullness, tenderness   Bony Pelvis: gynecoid  System: Breast:  normal appearance, no masses or tenderness   Skin: normal coloration and turgor, no rashes    Neurologic: oriented, grossly non-focal   Extremities: normal strength, tone, and muscle mass   HEENT neck supple with midline trachea   Mouth/Teeth mucous membranes moist, pharynx normal without lesions   Neck supple and no masses   Cardiovascular: regular rate and rhythm   Respiratory:  appears well, vitals normal, no respiratory distress, acyanotic, normal RR, ear  and throat exam is normal, neck free of mass or lymphadenopathy, chest clear, no wheezing, crepitations, rhonchi, normal symmetric air entry   Abdomen: soft, non-tender; bowel sounds normal; no masses,  no organomegaly   Urinary: urethral meatus normal      Assessment:    Pregnancy: [id number] Patient Active Problem List   Diagnosis Date Noted  . Intrauterine pregnancy 10/11/2015  . Morbid obesity (HCC) 10/11/2015        Plan:     Initial labs drawn. Prenatal vitamins. Problem list reviewed and updated. Genetic Screening discussed First Screen: ordered.  Ultrasound discussed; fetal survey: ordered.  Follow up in 4 weeks. 50% of 30 min visit spent on counseling and coordination of care.      [facility] 11/08/2015

## 2015-11-08 NOTE — Patient Instructions (Signed)
First Trimester of Pregnancy The first trimester of pregnancy is from week 1 until the end of week 12 (months 1 through 3). A week after a sperm fertilizes an egg, the egg will implant on the wall of the uterus. This embryo will begin to develop into a baby. Genes from you and your partner are forming the baby. The female genes determine whether the baby is a boy or a girl. At 6-8 weeks, the eyes and face are formed, and the heartbeat can be seen on ultrasound. At the end of 12 weeks, all the baby's organs are formed.  Now that you are pregnant, you will want to do everything you can to have a healthy baby. Two of the most important things are to get good prenatal care and to follow your health care provider's instructions. Prenatal care is all the medical care you receive before the baby's birth. This care will help prevent, find, and treat any problems during the pregnancy and childbirth. BODY CHANGES Your body goes through many changes during pregnancy. The changes vary from woman to woman.   You may gain or lose a couple of pounds at first.  You may feel sick to your stomach (nauseous) and throw up (vomit). If the vomiting is uncontrollable, call your health care provider.  You may tire easily.  You may develop headaches that can be relieved by medicines approved by your health care provider.  You may urinate more often. Painful urination may mean you have a bladder infection.  You may develop heartburn as a result of your pregnancy.  You may develop constipation because certain hormones are causing the muscles that push waste through your intestines to slow down.  You may develop hemorrhoids or swollen, bulging veins (varicose veins).  Your breasts may begin to grow larger and become tender. Your nipples may stick out more, and the tissue that surrounds them (areola) may become darker.  Your gums may bleed and may be sensitive to brushing and flossing.  Dark spots or blotches (chloasma,  mask of pregnancy) may develop on your face. This will likely fade after the baby is born.  Your menstrual periods will stop.  You may have a loss of appetite.  You may develop cravings for certain kinds of food.  You may have changes in your emotions from day to day, such as being excited to be pregnant or being concerned that something may go wrong with the pregnancy and baby.  You may have more vivid and strange dreams.  You may have changes in your hair. These can include thickening of your hair, rapid growth, and changes in texture. Some women also have hair loss during or after pregnancy, or hair that feels dry or thin. Your hair will most likely return to normal after your baby is born. WHAT TO EXPECT AT YOUR PRENATAL VISITS During a routine prenatal visit:  You will be weighed to make sure you and the baby are growing normally.  Your blood pressure will be taken.  Your abdomen will be measured to track your baby's growth.  The fetal heartbeat will be listened to starting around week 10 or 12 of your pregnancy.  Test results from any previous visits will be discussed. Your health care provider may ask you:  How you are feeling.  If you are feeling the baby move.  If you have had any abnormal symptoms, such as leaking fluid, bleeding, severe headaches, or abdominal cramping.  If you are using any tobacco products,   including cigarettes, chewing tobacco, and electronic cigarettes.  If you have any questions. Other tests that may be performed during your first trimester include:  Blood tests to find your blood type and to check for the presence of any previous infections. They will also be used to check for low iron levels (anemia) and Rh antibodies. Later in the pregnancy, blood tests for diabetes will be done along with other tests if problems develop.  Urine tests to check for infections, diabetes, or protein in the urine.  An ultrasound to confirm the proper growth  and development of the baby.  An amniocentesis to check for possible genetic problems.  Fetal screens for spina bifida and Down syndrome.  You may need other tests to make sure you and the baby are doing well.  HIV (human immunodeficiency virus) testing. Routine prenatal testing includes screening for HIV, unless you choose not to have this test. HOME CARE INSTRUCTIONS  Medicines  Follow your health care provider's instructions regarding medicine use. Specific medicines may be either safe or unsafe to take during pregnancy.  Take your prenatal vitamins as directed.  If you develop constipation, try taking a stool softener if your health care provider approves. Diet  Eat regular, well-balanced meals. Choose a variety of foods, such as meat or vegetable-based protein, fish, milk and low-fat dairy products, vegetables, fruits, and whole grain breads and cereals. Your health care provider will help you determine the amount of weight gain that is right for you.  Avoid raw meat and uncooked cheese. These carry germs that can cause birth defects in the baby.  Eating four or five small meals rather than three large meals a day may help relieve nausea and vomiting. If you start to feel nauseous, eating a few soda crackers can be helpful. Drinking liquids between meals instead of during meals also seems to help nausea and vomiting.  If you develop constipation, eat more high-fiber foods, such as fresh vegetables or fruit and whole grains. Drink enough fluids to keep your urine clear or pale yellow. Activity and Exercise  Exercise only as directed by your health care provider. Exercising will help you:  Control your weight.  Stay in shape.  Be prepared for labor and delivery.  Experiencing pain or cramping in the lower abdomen or low back is a good sign that you should stop exercising. Check with your health care provider before continuing normal exercises.  Try to avoid standing for long  periods of time. Move your legs often if you must stand in one place for a long time.  Avoid heavy lifting.  Wear low-heeled shoes, and practice good posture.  You may continue to have sex unless your health care provider directs you otherwise. Relief of Pain or Discomfort  Wear a good support bra for breast tenderness.   Take warm sitz baths to soothe any pain or discomfort caused by hemorrhoids. Use hemorrhoid cream if your health care provider approves.   Rest with your legs elevated if you have leg cramps or low back pain.  If you develop varicose veins in your legs, wear support hose. Elevate your feet for 15 minutes, 3-4 times a day. Limit salt in your diet. Prenatal Care  Schedule your prenatal visits by the twelfth week of pregnancy. They are usually scheduled monthly at first, then more often in the last 2 months before delivery.  Write down your questions. Take them to your prenatal visits.  Keep all your prenatal visits as directed by your   health care provider. Safety  Wear your seat belt at all times when driving.  Make a list of emergency phone numbers, including numbers for family, friends, the hospital, and police and fire departments. General Tips  Ask your health care provider for a referral to a local prenatal education class. Begin classes no later than at the beginning of month 6 of your pregnancy.  Ask for help if you have counseling or nutritional needs during pregnancy. Your health care provider can offer advice or refer you to specialists for help with various needs.  Do not use hot tubs, steam rooms, or saunas.  Do not douche or use tampons or scented sanitary pads.  Do not cross your legs for long periods of time.  Avoid cat litter boxes and soil used by cats. These carry germs that can cause birth defects in the baby and possibly loss of the fetus by miscarriage or stillbirth.  Avoid all smoking, herbs, alcohol, and medicines not prescribed by  your health care provider. Chemicals in these affect the formation and growth of the baby.  Do not use any tobacco products, including cigarettes, chewing tobacco, and electronic cigarettes. If you need help quitting, ask your health care provider. You may receive counseling support and other resources to help you quit.  Schedule a dentist appointment. At home, brush your teeth with a soft toothbrush and be gentle when you floss. SEEK MEDICAL CARE IF:   You have dizziness.  You have mild pelvic cramps, pelvic pressure, or nagging pain in the abdominal area.  You have persistent nausea, vomiting, or diarrhea.  You have a bad smelling vaginal discharge.  You have pain with urination.  You notice increased swelling in your face, hands, legs, or ankles. SEEK IMMEDIATE MEDICAL CARE IF:   You have a fever.  You are leaking fluid from your vagina.  You have spotting or bleeding from your vagina.  You have severe abdominal cramping or pain.  You have rapid weight gain or loss.  You vomit blood or material that looks like coffee grounds.  You are exposed to German measles and have never had them.  You are exposed to fifth disease or chickenpox.  You develop a severe headache.  You have shortness of breath.  You have any kind of trauma, such as from a fall or a car accident.   This information is not intended to replace advice given to you by your health care provider. Make sure you discuss any questions you have with your health care provider.   Document Released: 09/18/2001 Document Revised: 10/15/2014 Document Reviewed: 08/04/2013 Elsevier Interactive Patient Education 2016 [employer]  

## 2015-11-09 LAB — PRESCRIPTION MONITORING PROFILE (19 PANEL)
AMPHETAMINE/METH: NEGATIVE ng/mL
BARBITURATE SCREEN, URINE: NEGATIVE ng/mL
BENZODIAZEPINE SCREEN, URINE: NEGATIVE ng/mL
BUPRENORPHINE, URINE: NEGATIVE ng/mL
Cannabinoid Scrn, Ur: NEGATIVE ng/mL
Carisoprodol, Urine: NEGATIVE ng/mL
Cocaine Metabolites: NEGATIVE ng/mL
Creatinine, Urine: 245 mg/dL (ref >20.0)
ECSTASY: NEGATIVE ng/mL
FENTANYL URINE: NEGATIVE ng/mL
METHADONE SCREEN, URINE: NEGATIVE ng/mL
METHAQUALONE SCREEN (URINE): NEGATIVE ng/mL
Meperidine, Ur: NEGATIVE ng/mL
NITRITES URINE, INITIAL: NEGATIVE ug/mL
Opiate Screen, Urine: NEGATIVE ng/mL
Oxycodone Screen, Ur: NEGATIVE ng/mL
PROPOXYPHENE: NEGATIVE ng/mL
Phencyclidine, Ur: NEGATIVE ng/mL
TRAMADOL UR: NEGATIVE ng/mL
Tapentadol, urine: NEGATIVE ng/mL
Zolpidem, Urine: NEGATIVE ng/mL
pH, Initial: 6.2 pH (ref 4.5–8.9)

## 2015-11-09 LAB — HEMOGLOBINOPATHY EVALUATION
HEMOGLOBIN OTHER: 0 %
HGB A2 QUANT: 2.2 % (ref 2.2–3.2)
HGB A: 97.8 % (ref 96.8–97.8)
HGB S QUANTITAION: 0 %
Hgb F Quant: 0 % (ref 0.0–2.0)

## 2015-11-09 LAB — GC/CHLAMYDIA PROBE AMP (~~LOC~~) NOT AT ARMC
Chlamydia: NEGATIVE
Neisseria Gonorrhea: NEGATIVE

## 2015-11-09 LAB — GLUCOSE TOLERANCE, 1 HOUR (50G) W/O FASTING: Glucose, 1 Hour GTT: 122 mg/dL (ref 70–140)

## 2015-11-10 LAB — CULTURE, OB URINE
Colony Count: NO GROWTH
ORGANISM ID, BACTERIA: NO GROWTH

## 2015-11-10 LAB — PRENATAL PROFILE (SOLSTAS)
ANTIBODY SCREEN: NEGATIVE
BASOS ABS: 0 10*3/uL (ref 0.0–0.1)
Basophils Relative: 0 % (ref 0–1)
EOS ABS: 0.1 10*3/uL (ref 0.0–0.7)
EOS PCT: 1 % (ref 0–5)
HCT: 37 % (ref 36.0–46.0)
HIV 1&2 Ab, 4th Generation: NONREACTIVE
Hemoglobin: 11.7 g/dL — ABNORMAL LOW (ref 12.0–15.0)
Hepatitis B Surface Ag: NEGATIVE
Lymphocytes Relative: 27 % (ref 12–46)
Lymphs Abs: 2 10*3/uL (ref 0.7–4.0)
MCH: 27.9 pg (ref 26.0–34.0)
MCHC: 31.6 g/dL (ref 30.0–36.0)
MCV: 88.1 fL (ref 78.0–100.0)
MONO ABS: 0.5 10*3/uL (ref 0.1–1.0)
MPV: 8.6 fL (ref 8.6–12.4)
Monocytes Relative: 6 % (ref 3–12)
Neutro Abs: 5 10*3/uL (ref 1.7–7.7)
Neutrophils Relative %: 66 % (ref 43–77)
Platelets: 377 10*3/uL (ref 150–400)
RBC: 4.2 MIL/uL (ref 3.87–5.11)
RDW: 14.1 % (ref 11.5–15.5)
RH TYPE: POSITIVE
RUBELLA: 1.86 {index} — AB (ref <0.90)
WBC: 7.5 10*3/uL (ref 4.0–10.5)

## 2015-11-15 ENCOUNTER — Encounter: Payer: Self-pay | Admitting: Family Medicine

## 2015-11-21 ENCOUNTER — Encounter (HOSPITAL_COMMUNITY): Payer: Self-pay | Admitting: *Deleted

## 2015-11-21 ENCOUNTER — Inpatient Hospital Stay (HOSPITAL_COMMUNITY)
Admission: AD | Admit: 2015-11-21 | Discharge: 2015-11-21 | Disposition: A | Discharge status: Home / Self Care | Payer: Medicaid Other | Source: Ambulatory Visit | Attending: Family Medicine | Admitting: Family Medicine

## 2015-11-21 DIAGNOSIS — M545 Low back pain: Secondary | ICD-10-CM | POA: Diagnosis not present

## 2015-11-21 DIAGNOSIS — O26891 Other specified pregnancy related conditions, first trimester: Secondary | ICD-10-CM | POA: Insufficient documentation

## 2015-11-21 DIAGNOSIS — O9989 Other specified diseases and conditions complicating pregnancy, childbirth and the puerperium: Secondary | ICD-10-CM

## 2015-11-21 DIAGNOSIS — M549 Dorsalgia, unspecified: Secondary | ICD-10-CM

## 2015-11-21 DIAGNOSIS — Z3401 Encounter for supervision of normal first pregnancy, first trimester: Secondary | ICD-10-CM

## 2015-11-21 DIAGNOSIS — O99891 Other specified diseases and conditions complicating pregnancy: Secondary | ICD-10-CM

## 2015-11-21 DIAGNOSIS — O26892 Other specified pregnancy related conditions, second trimester: Secondary | ICD-10-CM

## 2015-11-21 DIAGNOSIS — Z87891 Personal history of nicotine dependence: Secondary | ICD-10-CM | POA: Insufficient documentation

## 2015-11-21 DIAGNOSIS — R200 Anesthesia of skin: Secondary | ICD-10-CM | POA: Insufficient documentation

## 2015-11-21 DIAGNOSIS — Z3A12 12 weeks gestation of pregnancy: Secondary | ICD-10-CM | POA: Insufficient documentation

## 2015-11-21 HISTORY — DX: Morbid (severe) obesity due to excess calories: E66.01

## 2015-11-21 LAB — URINALYSIS, ROUTINE W REFLEX MICROSCOPIC
Bilirubin Urine: NEGATIVE
GLUCOSE, UA: NEGATIVE mg/dL
Hgb urine dipstick: NEGATIVE
Ketones, ur: NEGATIVE mg/dL
LEUKOCYTES UA: NEGATIVE
Nitrite: NEGATIVE
PROTEIN: NEGATIVE mg/dL
SPECIFIC GRAVITY, URINE: 1.02 (ref 1.005–1.030)
pH: 6 (ref 5.0–8.0)

## 2015-11-21 NOTE — MAU Note (Signed)
For about a month and a half, has been having terrible back pain.  Sides of legs feel numb,when she stand up to walk she gets a sharp pain in her legs. Earlier today when at work she fell,from the pain in her leg.

## 2015-11-21 NOTE — MAU Provider Note (Signed)
History   G3P0020 @ 12.2 wks in with c/o low back [id number] been going on for several weeks. Pain is dull in nature and intermattant according to activity.  CSN: [id number]  Arrival date & time 11/21/15  1313   None     Chief Complaint  Patient presents with  . Back Pain  . Leg Pain  . Numbness    HPI  Past Medical History  Diagnosis Date  . Medical history non-contributory   . Obesities, morbid [facility])     Past Surgical History  Procedure Laterality Date  . No past surgeries      Family History  Problem Relation Age of Onset  . Alcohol abuse Father     Social History  Substance Use Topics  . Smoking status: Former Smoker    Quit date: 09/30/2015  . Smokeless tobacco: Never Used  . Alcohol Use: No    OB History    Gravida Para Term Preterm AB TAB SAB Ectopic Multiple Living   3 0 0 0 0        Review of Systems  Allergies  Review of patient's allergies indicates no known allergies.  Home Medications  No current outpatient prescriptions on file.  BP 121/68 mmHg  Pulse 84  Temp(Src) 97.8 F (36.6 C) (Oral)  Resp 20  Ht  (1.651 m)  Wt 294 lb 9.6 oz (133.63 kg)  BMI 49.02 kg/m2  LMP 08/21/2015 (Exact Date)  Physical Exam  MAU Course  Procedures (including critical care time)  Labs Reviewed  URINALYSIS, ROUTINE W REFLEX MICROSCOPIC (NOT AT [facility])   No results found.   1. Encounter for supervision of normal first pregnancy in first trimester       MDM  Active fetus on bedside u/s. Discussed comfort merasures for back pain. Will d/c home

## 2015-11-21 NOTE — Discharge Instructions (Signed)

## 2015-11-22 NOTE — Progress Notes (Signed)
Called pt and informed pt that we have received FMLA paperwork to please come to front office to fill out ROI after her [place] appt scheduled on 11/23/15.  Pt agreed.

## 2015-11-23 ENCOUNTER — Ambulatory Visit (HOSPITAL_COMMUNITY)
Admission: RE | Admit: 2015-11-23 | Discharge: 2015-11-23 | Disposition: A | Discharge status: Home / Self Care | Payer: Medicaid Other | Source: Ambulatory Visit | Attending: Advanced Practice Midwife | Admitting: Advanced Practice Midwife

## 2015-11-23 ENCOUNTER — Encounter (HOSPITAL_COMMUNITY): Payer: Self-pay

## 2015-11-23 ENCOUNTER — Ambulatory Visit (HOSPITAL_COMMUNITY): Admission: RE | Admit: 2015-11-23 | Payer: Medicaid Other | Source: Ambulatory Visit

## 2015-11-23 ENCOUNTER — Other Ambulatory Visit: Payer: Self-pay | Admitting: Advanced Practice Midwife

## 2015-11-23 VITALS — BP 112/43 | HR 71 | Wt 289.0 lb

## 2015-11-23 DIAGNOSIS — Z3A12 12 weeks gestation of pregnancy: Secondary | ICD-10-CM

## 2015-11-23 DIAGNOSIS — O99211 Obesity complicating pregnancy, first trimester: Secondary | ICD-10-CM | POA: Diagnosis present

## 2015-11-23 DIAGNOSIS — Z3402 Encounter for supervision of normal first pregnancy, second trimester: Secondary | ICD-10-CM

## 2015-11-23 DIAGNOSIS — Z3491 Encounter for supervision of normal pregnancy, unspecified, first trimester: Secondary | ICD-10-CM

## 2015-11-23 DIAGNOSIS — Z36 Encounter for antenatal screening of mother: Secondary | ICD-10-CM | POA: Insufficient documentation

## 2015-11-23 DIAGNOSIS — Z3A13 13 weeks gestation of pregnancy: Secondary | ICD-10-CM

## 2015-11-23 DIAGNOSIS — Z369 Encounter for antenatal screening, unspecified: Secondary | ICD-10-CM

## 2015-11-28 ENCOUNTER — Inpatient Hospital Stay (HOSPITAL_COMMUNITY)
Admission: AD | Admit: 2015-11-28 | Discharge: 2015-11-28 | Disposition: A | Discharge status: Home / Self Care | Payer: Medicaid Other | Source: Ambulatory Visit | Attending: Obstetrics and Gynecology | Admitting: Obstetrics and Gynecology

## 2015-11-28 ENCOUNTER — Encounter (HOSPITAL_COMMUNITY): Payer: Self-pay | Admitting: *Deleted

## 2015-11-28 ENCOUNTER — Inpatient Hospital Stay (HOSPITAL_COMMUNITY): Payer: Medicaid Other

## 2015-11-28 DIAGNOSIS — Z3A12 12 weeks gestation of pregnancy: Secondary | ICD-10-CM | POA: Diagnosis not present

## 2015-11-28 DIAGNOSIS — Z87891 Personal history of nicotine dependence: Secondary | ICD-10-CM | POA: Diagnosis not present

## 2015-11-28 DIAGNOSIS — O209 Hemorrhage in early pregnancy, unspecified: Secondary | ICD-10-CM | POA: Insufficient documentation

## 2015-11-28 DIAGNOSIS — N898 Other specified noninflammatory disorders of vagina: Secondary | ICD-10-CM | POA: Diagnosis not present

## 2015-11-28 DIAGNOSIS — O4691 Antepartum hemorrhage, unspecified, first trimester: Secondary | ICD-10-CM

## 2015-11-28 DIAGNOSIS — O468X1 Other antepartum hemorrhage, first trimester: Secondary | ICD-10-CM | POA: Insufficient documentation

## 2015-11-28 DIAGNOSIS — Z79899 Other long term (current) drug therapy: Secondary | ICD-10-CM | POA: Diagnosis not present

## 2015-11-28 DIAGNOSIS — O4690 Antepartum hemorrhage, unspecified, unspecified trimester: Secondary | ICD-10-CM

## 2015-11-28 LAB — URINALYSIS, ROUTINE W REFLEX MICROSCOPIC
BILIRUBIN URINE: NEGATIVE
Glucose, UA: NEGATIVE mg/dL
Ketones, ur: NEGATIVE mg/dL
Leukocytes, UA: NEGATIVE
NITRITE: NEGATIVE
PROTEIN: NEGATIVE mg/dL
SPECIFIC GRAVITY, URINE: 1.01 (ref 1.005–1.030)
pH: 6 (ref 5.0–8.0)

## 2015-11-28 LAB — URINE MICROSCOPIC-ADD ON

## 2015-11-28 NOTE — MAU Provider Note (Signed)
History     CSN: [id number]  Arrival date and time: 11/28/15 0330   First Provider Initiated Contact with Patient 11/28/15 [phone]      Chief Complaint  Patient presents with  . Vaginal Bleeding   Vaginal Bleeding The patient's primary symptoms include vaginal bleeding. The patient's pertinent negatives include no pelvic pain. This is a new problem. The current episode started in the past 7 days. The problem occurs intermittently. The problem has been unchanged. The patient is experiencing no pain. She is pregnant. Pertinent negatives include no abdominal pain, chills, constipation, diarrhea, dysuria, fever, frequency, nausea, urgency or vomiting. The vaginal discharge was bloody. The vaginal bleeding is spotting (only present when wiping ). She has not been passing clots. She has not been passing tissue. The symptoms are aggravated by intercourse (had intercourse just prior to bleeding starting. ). She has tried nothing for the symptoms. She is sexually active.    Past Medical History  Diagnosis Date  . Medical history non-contributory   . Obesities, morbid [facility])     Past Surgical History  Procedure Laterality Date  . No past surgeries      Family History  Problem Relation Age of Onset  . Alcohol abuse Father     Social History  Substance Use Topics  . Smoking status: Former Smoker    Quit date: 09/30/2015  . Smokeless tobacco: Never Used  . Alcohol Use: No    Allergies: No Known Allergies  Prescriptions prior to admission  Medication Sig Dispense Refill Last Dose  . acetaminophen (TYLENOL) 500 MG tablet Take 1,000 mg by mouth every 6 (six) hours as needed for moderate pain.   11/20/2015 at Unknown time  . Prenatal Vit-Fe Fumarate-FA (PRENATAL COMPLETE) 14-0.4 MG TABS Take 2 tablets by mouth daily. 60 each 10 11/20/2015 at Unknown time    Review of Systems  Constitutional: Negative for fever and chills.  Gastrointestinal: Negative for nausea, vomiting, abdominal pain,  diarrhea and constipation.  Genitourinary: Positive for vaginal bleeding. Negative for dysuria, urgency, frequency and pelvic pain.   Physical Exam   Blood pressure 137/72, pulse 84, temperature 97.9 F (36.6 C), temperature source Oral, resp. rate 18, height  (1.651 m), weight 131.543 kg (290 lb), last menstrual period 08/21/2015, SpO2 98 %, unknown if currently breastfeeding.  Physical Exam  Nursing note and vitals reviewed. Constitutional: She is oriented to person, place, and time. She appears well-developed and well-nourished. No distress.  HENT:  Head: Normocephalic.  Cardiovascular: Normal rate.   Respiratory: Effort normal.  GI: Soft. There is no tenderness. There is no rebound.  Genitourinary:  Unable to doppler FHT, will send for [place]   Musculoskeletal: Normal range of motion.  Neurological: She is alert and oriented to person, place, and time.  Skin: Skin is warm and dry.  Psychiatric: She has a normal mood and affect.    CLINICAL DATA: Acute onset of vaginal bleeding. Initial encounter.  EXAM: OBSTETRIC <14 WK ULTRASOUND  TECHNIQUE: Transabdominal ultrasound was performed for evaluation of the gestation as well as the maternal uterus and adnexal regions.  COMPARISON: Pelvic ultrasound performed 11/23/2015  FINDINGS: Intrauterine gestational sac: Visualized/normal in shape.  Yolk sac: No  Embryo: Yes  Cardiac Activity: Yes  Heart Rate: 166 bpm  CRL: 6.2 cm 12 w 4 d [place] EDC: 06/07/2016  Subchorionic hemorrhage: None visualized.  Maternal uterus/adnexae: The uterus is otherwise unremarkable. The ovaries are not visualized on this study.  No free fluid is seen within the  pelvic cul-de-sac.  IMPRESSION: Single live intrauterine pregnancy noted, with a crown-rump length of 6.2 cm, corresponding to a gestational age of [redacted] weeks 4 days. The gestational age of [redacted] weeks 2 days by prior ultrasound reflects an  estimated date of delivery of June 02, 2016, which is being used as the clinical estimated date of delivery.   Electronically Signed  By: Roanna Raider M.D.  On: 11/28/2015 04:27 MAU Course  Procedures  MDM   Assessment and Plan   1. Vaginal bleeding in pregnancy    DC home Comfort measures reviewed  2ndTrimester precautions  Bleeding precautions RX: none  Return to MAU as needed FU with OB as planned  Follow-up Information    Follow up with [facility].   Specialty:  Obstetrics and Gynecology   Why:  As scheduled   Contact information:   [address] [place] [address] [phone]        Tawnya Crook 11/28/2015, 3:52 AM

## 2015-11-28 NOTE — MAU Note (Signed)
Pt reports spotting off/on for the last 3-4 days, denies pain.

## 2015-11-28 NOTE — Discharge Instructions (Signed)
Pelvic Rest °Pelvic rest is sometimes recommended for women when:  °· The placenta is partially or completely covering the opening of the cervix (placenta previa). °· There is bleeding between the uterine wall and the amniotic sac in the first trimester (subchorionic hemorrhage). °· The cervix begins to open without labor starting (incompetent cervix, cervical insufficiency). °· The labor is too early (preterm labor). °HOME CARE INSTRUCTIONS °· Do not have sexual intercourse, stimulation, or an orgasm. °· Do not use tampons, douche, or put anything in the vagina. °· Do not lift anything over 10 pounds (4.5 kg). °· Avoid strenuous activity or straining your pelvic muscles. °SEEK MEDICAL CARE IF:  °· You have any vaginal bleeding during pregnancy. Treat this as a potential emergency. °· You have cramping pain felt low in the stomach (stronger than menstrual cramps). °· You notice vaginal discharge (watery, mucus, or bloody). °· You have a low, dull backache. °· There are regular contractions or uterine tightening. °SEEK IMMEDIATE MEDICAL CARE IF: °You have vaginal bleeding and have placenta previa.  °  °This information is not intended to replace advice given to you by your health care provider. Make sure you discuss any questions you have with your health care provider. °  °Document Released: 01/19/2011 Document Revised: 12/17/2011 Document Reviewed: 03/28/2015 °Elsevier Interactive Patient Education ©2016 Elsevier Inc. ° °

## 2015-12-06 ENCOUNTER — Encounter: Payer: Self-pay | Admitting: Advanced Practice Midwife

## 2015-12-08 ENCOUNTER — Encounter (HOSPITAL_COMMUNITY): Payer: Self-pay | Admitting: *Deleted

## 2015-12-08 ENCOUNTER — Ambulatory Visit (INDEPENDENT_AMBULATORY_CARE_PROVIDER_SITE_OTHER): Payer: Medicaid Other | Admitting: Advanced Practice Midwife

## 2015-12-08 ENCOUNTER — Encounter: Payer: Self-pay | Admitting: Advanced Practice Midwife

## 2015-12-08 ENCOUNTER — Inpatient Hospital Stay (HOSPITAL_COMMUNITY)
Admission: AD | Admit: 2015-12-08 | Discharge: 2015-12-08 | Disposition: A | Discharge status: Home / Self Care | Payer: Medicaid Other | Source: Ambulatory Visit | Attending: Obstetrics & Gynecology | Admitting: Obstetrics & Gynecology

## 2015-12-08 VITALS — BP 124/67 | HR 81 | Wt 292.8 lb

## 2015-12-08 DIAGNOSIS — Z3A14 14 weeks gestation of pregnancy: Secondary | ICD-10-CM | POA: Diagnosis not present

## 2015-12-08 DIAGNOSIS — N939 Abnormal uterine and vaginal bleeding, unspecified: Secondary | ICD-10-CM | POA: Diagnosis present

## 2015-12-08 DIAGNOSIS — R51 Headache: Secondary | ICD-10-CM

## 2015-12-08 DIAGNOSIS — O4692 Antepartum hemorrhage, unspecified, second trimester: Secondary | ICD-10-CM

## 2015-12-08 DIAGNOSIS — Z3402 Encounter for supervision of normal first pregnancy, second trimester: Secondary | ICD-10-CM

## 2015-12-08 DIAGNOSIS — O99212 Obesity complicating pregnancy, second trimester: Secondary | ICD-10-CM | POA: Diagnosis not present

## 2015-12-08 DIAGNOSIS — Z87891 Personal history of nicotine dependence: Secondary | ICD-10-CM | POA: Insufficient documentation

## 2015-12-08 DIAGNOSIS — O9989 Other specified diseases and conditions complicating pregnancy, childbirth and the puerperium: Secondary | ICD-10-CM

## 2015-12-08 DIAGNOSIS — Z3481 Encounter for supervision of other normal pregnancy, first trimester: Secondary | ICD-10-CM

## 2015-12-08 DIAGNOSIS — R519 Headache, unspecified: Secondary | ICD-10-CM | POA: Insufficient documentation

## 2015-12-08 DIAGNOSIS — O209 Hemorrhage in early pregnancy, unspecified: Secondary | ICD-10-CM | POA: Insufficient documentation

## 2015-12-08 MED ORDER — BUTALBITAL-APAP-CAFFEINE 50-325-40 MG PO CAPS
1.0000 | ORAL_CAPSULE | Freq: Four times a day (QID) | ORAL | Status: DC | PRN
Start: 2015-12-08 — End: 2015-12-24

## 2015-12-08 NOTE — Patient Instructions (Signed)

## 2015-12-08 NOTE — MAU Provider Note (Signed)
History     CSN: [id number]  Arrival date and time: 12/08/15 2204   First Provider Initiated Contact with Patient 12/08/15 2249      Chief Complaint  Patient presents with  . Vaginal Bleeding   Vaginal Bleeding The patient's primary symptoms include vaginal bleeding. The patient's pertinent negatives include no pelvic pain. This is a new problem. The current episode started today (about one hour PTA ). The problem occurs intermittently. The problem has been unchanged. The patient is experiencing no pain. She is pregnant. Pertinent negatives include no abdominal pain, chills, constipation, diarrhea, dysuria, fever, frequency, nausea, urgency or vomiting. The vaginal discharge was bloody. The vaginal bleeding is spotting. She has been passing clots (x1 ). She has not been passing tissue. The symptoms are aggravated by intercourse (Patient reports intercourse within the last 24 hours ). She has tried nothing for the symptoms. She is sexually active.   Past Medical History  Diagnosis Date  . Medical history non-contributory   . Obesities, morbid [facility])     Past Surgical History  Procedure Laterality Date  . No past surgeries    . Wisdom tooth extraction      Family History  Problem Relation Age of Onset  . Alcohol abuse Father     Social History  Substance Use Topics  . Smoking status: Former Smoker    Quit date: 09/30/2015  . Smokeless tobacco: Never Used  . Alcohol Use: No    Allergies: No Known Allergies  Prescriptions prior to admission  Medication Sig Dispense Refill Last Dose  . acetaminophen (TYLENOL) 500 MG tablet Take 1,000 mg by mouth every 6 (six) hours as needed for moderate pain.   Taking  . Butalbital-APAP-Caffeine 50-325-40 MG capsule Take 1-2 capsules by mouth every 6 (six) hours as needed for headache. 30 capsule 3   . Prenatal Vit-Fe Fumarate-FA (PRENATAL COMPLETE) 14-0.4 MG TABS Take 2 tablets by mouth daily. 60 each 10 Taking    Review of Systems   Constitutional: Negative for fever and chills.  Gastrointestinal: Negative for nausea, vomiting, abdominal pain, diarrhea and constipation.  Genitourinary: Positive for vaginal bleeding. Negative for dysuria, urgency, frequency and pelvic pain.   Physical Exam   Blood pressure 128/79, pulse 113, temperature 97.7 F (36.5 C), temperature source Oral, resp. rate 20, height  (1.626 m), weight 134.038 kg (295 lb 8 oz), last menstrual period 08/21/2015, SpO2 100 %, unknown if currently breastfeeding.  Physical Exam  Nursing note and vitals reviewed. Constitutional: She is oriented to person, place, and time. She appears well-developed and well-nourished. No distress.  HENT:  Head: Normocephalic.  Cardiovascular: Normal rate.   Respiratory: Effort normal.  GI: Soft. There is no tenderness. There is no rebound.  Genitourinary:   External: no lesion Vagina: small amount of bright red blood in the vagina. Once cleaned with a faux swab no new active bleeding seen.  Cervix: pink, smooth, closed/thick  Uterus: AGA, FHT 162 with doppler     Neurological: She is alert and oriented to person, place, and time.  Skin: Skin is warm and dry.  Psychiatric: She has a normal mood and affect.    MAU Course  Procedures  MDM   Assessment and Plan   1. Second trimester bleeding   2. Encounter for supervision of normal first pregnancy in second trimester   3. Morbid obesity, unspecified obesity type (HCC)    DC home Comfort measures reviewed Pelvic rest   2nd Trimester precautions  Bleeding precautions  RX: none  Return to MAU as needed FU with OB as planned  Follow-up Information    Follow up with [facility].   Specialty:  Obstetrics and Gynecology   Why:  As scheduled   Contact information:   [address] [place] [address] [phone]       Tawnya Crook 12/08/2015, 10:50 PM

## 2015-12-08 NOTE — Progress Notes (Signed)
Subjective:  Cynthia Valenzuela is a 20 y.o. G3P0020 at [redacted]w[redacted]d being seen today for ongoing prenatal care.  She is currently monitored for the following issues for this low-risk pregnancy and has Supervision of normal first pregnancy; Morbid obesity (HCC); Bleeding in early pregnancy; and Frequent headaches on her problem list.  Patient reports backache, headache and symptoms similar to sciatica, switches sides. Has not taken anything for headache., No photosensitivity.  Contractions: Not present. Vag. Bleeding: None.  Movement: Absent. Denies leaking of fluid.   The following portions of the patient's history were reviewed and updated as appropriate: allergies, current medications, past family history, past medical history, past social history, past surgical history and problem list. Problem list updated.  Objective:   Filed Vitals:   12/08/15 1250  BP: 124/67  Pulse: 81  Weight: 292 lb 12.8 oz (132.813 kg)    Fetal Status: Fetal Heart Rate (bpm): 164   Movement: Absent     General:  Alert, oriented and cooperative. Patient is in no acute distress.  Skin: Skin is warm and dry. No rash noted.   Cardiovascular: Normal heart rate noted  Respiratory: Normal respiratory effort, no problems with respiration noted  Abdomen: Soft, gravid, appropriate for gestational age. Pain/Pressure: Absent     Pelvic: Vag. Bleeding: None     Cervical exam deferred        Extremities: Normal range of motion.  Edema: Trace  Mental Status: Normal mood and affect. Normal behavior. Normal judgment and thought content.   Urinalysis:      Assessment and Plan:  Pregnancy: G3P0020 at [redacted]w[redacted]d  1. Bleeding in early pregnancy     Stopped bleeding - [place] MFM OB COMP + 14 WK; Future  2. Frequent headaches      Not migraine in character, will Rx fioricet, warned not to use Tylenol within 4 hrs  3. Encounter for supervision of normal first pregnancy in second trimester      Quad screen next visit      [place] early  April  Preterm labor symptoms and general obstetric precautions including but not limited to vaginal bleeding, contractions, leaking of fluid and fetal movement were reviewed in detail with the patient. Please refer to After Visit Summary for other counseling recommendations.  RTC 4 weeks  Aviva Signs, CNM

## 2015-12-08 NOTE — Discharge Instructions (Signed)
Pelvic Rest Pelvic rest is sometimes recommended for women when:   The placenta is partially or completely covering the opening of the cervix (placenta previa).  There is bleeding between the uterine wall and the amniotic sac in the first trimester (subchorionic hemorrhage).  The cervix begins to open without labor starting (incompetent cervix, cervical insufficiency).  The labor is too early (preterm labor). HOME CARE INSTRUCTIONS  Do not have sexual intercourse, stimulation, or an orgasm.  Do not use tampons, douche, or put anything in the vagina.  Do not lift anything over 10 pounds (4.5 kg).  Avoid strenuous activity or straining your pelvic muscles. SEEK MEDICAL CARE IF:  You have any vaginal bleeding during pregnancy. Treat this as a potential emergency.  You have cramping pain felt low in the stomach (stronger than menstrual cramps).  You notice vaginal discharge (watery, mucus, or bloody).  You have a low, dull backache.  There are regular contractions or uterine tightening. SEEK IMMEDIATE MEDICAL CARE IF: You have vaginal bleeding and have placenta previa.    This information is not intended to replace advice given to you by your health care provider. Make sure you discuss any questions you have with your health care provider.   Document Released: 01/19/2011 Document Revised: 12/17/2011 Document Reviewed: 03/28/2015 Elsevier Interactive Patient Education 2016 [employer].   Second Trimester of Pregnancy The second trimester is from week 13 through week 28, months 4 through 6. The second trimester is often a time when you feel your best. Your body has also adjusted to being pregnant, and you begin to feel better physically. Usually, morning sickness has lessened or quit completely, you may have more energy, and you may have an increase in appetite. The second trimester is also a time when the fetus is growing rapidly. At the end of the sixth month, the fetus is about  9 inches long and weighs about 1 pounds. You will likely begin to feel the baby move (quickening) between 18 and 20 weeks of the pregnancy. BODY CHANGES Your body goes through many changes during pregnancy. The changes vary from woman to woman.   Your weight will continue to increase. You will notice your lower abdomen bulging out.  You may begin to get stretch marks on your hips, abdomen, and breasts.  You may develop headaches that can be relieved by medicines approved by your health care provider.  You may urinate more often because the fetus is pressing on your bladder.  You may develop or continue to have heartburn as a result of your pregnancy.  You may develop constipation because certain hormones are causing the muscles that push waste through your intestines to slow down.  You may develop hemorrhoids or swollen, bulging veins (varicose veins).  You may have back pain because of the weight gain and pregnancy hormones relaxing your joints between the bones in your pelvis and as a result of a shift in weight and the muscles that support your balance.  Your breasts will continue to grow and be tender.  Your gums may bleed and may be sensitive to brushing and flossing.  Dark spots or blotches (chloasma, mask of pregnancy) may develop on your face. This will likely fade after the baby is born.  A dark line from your belly button to the pubic area (linea nigra) may appear. This will likely fade after the baby is born.  You may have changes in your hair. These can include thickening of your hair, rapid growth, and  changes in texture. Some women also have hair loss during or after pregnancy, or hair that feels dry or thin. Your hair will most likely return to normal after your baby is born. WHAT TO EXPECT AT YOUR PRENATAL VISITS During a routine prenatal visit:  You will be weighed to make sure you and the fetus are growing normally.  Your blood pressure will be taken.  Your  abdomen will be measured to track your baby's growth.  The fetal heartbeat will be listened to.  Any test results from the previous visit will be discussed. Your health care provider may ask you:  How you are feeling.  If you are feeling the baby move.  If you have had any abnormal symptoms, such as leaking fluid, bleeding, severe headaches, or abdominal cramping.  If you are using any tobacco products, including cigarettes, chewing tobacco, and electronic cigarettes.  If you have any questions. Other tests that may be performed during your second trimester include:  Blood tests that check for:  Low iron levels (anemia).  Gestational diabetes (between 24 and 28 weeks).  Rh antibodies.  Urine tests to check for infections, diabetes, or protein in the urine.  An ultrasound to confirm the proper growth and development of the baby.  An amniocentesis to check for possible genetic problems.  Fetal screens for spina bifida and Down syndrome.  HIV (human immunodeficiency virus) testing. Routine prenatal testing includes screening for HIV, unless you choose not to have this test. HOME CARE INSTRUCTIONS   Avoid all smoking, herbs, alcohol, and unprescribed drugs. These chemicals affect the formation and growth of the baby.  Do not use any tobacco products, including cigarettes, chewing tobacco, and electronic cigarettes. If you need help quitting, ask your health care provider. You may receive counseling support and other resources to help you quit.  Follow your health care provider's instructions regarding medicine use. There are medicines that are either safe or unsafe to take during pregnancy.  Exercise only as directed by your health care provider. Experiencing uterine cramps is a good sign to stop exercising.  Continue to eat regular, healthy meals.  Wear a good support bra for breast tenderness.  Do not use hot tubs, steam rooms, or saunas.  Wear your seat belt at all  times when driving.  Avoid raw meat, uncooked cheese, cat litter boxes, and soil used by cats. These carry germs that can cause birth defects in the baby.  Take your prenatal vitamins.  Take 1500-2000 mg of calcium daily starting at the 20th week of pregnancy until you deliver your baby.  Try taking a stool softener (if your health care provider approves) if you develop constipation. Eat more high-fiber foods, such as fresh vegetables or fruit and whole grains. Drink plenty of fluids to keep your urine clear or pale yellow.  Take warm sitz baths to soothe any pain or discomfort caused by hemorrhoids. Use hemorrhoid cream if your health care provider approves.  If you develop varicose veins, wear support hose. Elevate your feet for 15 minutes, 3-4 times a day. Limit salt in your diet.  Avoid heavy lifting, wear low heel shoes, and practice good posture.  Rest with your legs elevated if you have leg cramps or low back pain.  Visit your dentist if you have not gone yet during your pregnancy. Use a soft toothbrush to brush your teeth and be gentle when you floss.  A sexual relationship may be continued unless your health care provider directs you otherwise.  Continue to go to all your prenatal visits as directed by your health care provider. SEEK MEDICAL CARE IF:   You have dizziness.  You have mild pelvic cramps, pelvic pressure, or nagging pain in the abdominal area.  You have persistent nausea, vomiting, or diarrhea.  You have a bad smelling vaginal discharge.  You have pain with urination. SEEK IMMEDIATE MEDICAL CARE IF:   You have a fever.  You are leaking fluid from your vagina.  You have spotting or bleeding from your vagina.  You have severe abdominal cramping or pain.  You have rapid weight gain or loss.  You have shortness of breath with chest pain.  You notice sudden or extreme swelling of your face, hands, ankles, feet, or legs.  You have not felt your baby  move in over an hour.  You have severe headaches that do not go away with medicine.  You have vision changes.   This information is not intended to replace advice given to you by your health care provider. Make sure you discuss any questions you have with your health care provider.   Document Released: 09/18/2001 Document Revised: 10/15/2014 Document Reviewed: 11/25/2012 Elsevier Interactive Patient Education [employer]

## 2015-12-08 NOTE — MAU Note (Addendum)
PT  SAYS HAS  VAG  BLEEDING-  THAT   STARTED  945PM-    IN  TRIAGE -  TO B-ROOM-   RED  BLOOD  IN UNDERWEAR-  NO PAD ON.     NO CRAMPING.     LAST SEX- 24 HRS  AGO.         [facility]-  CLINIC-  HAD AN APPOINTMENT  TODAY-    WITH MARIE, CNM   -  ALL GOOD.

## 2015-12-24 ENCOUNTER — Telehealth: Payer: Self-pay | Admitting: Advanced Practice Midwife

## 2015-12-24 ENCOUNTER — Encounter (HOSPITAL_COMMUNITY): Payer: Self-pay | Admitting: *Deleted

## 2015-12-24 ENCOUNTER — Inpatient Hospital Stay (HOSPITAL_COMMUNITY)
Admission: AD | Admit: 2015-12-24 | Discharge: 2015-12-24 | Disposition: A | Discharge status: Home / Self Care | Payer: Medicaid Other | Source: Ambulatory Visit | Attending: Obstetrics & Gynecology | Admitting: Obstetrics & Gynecology

## 2015-12-24 DIAGNOSIS — O468X2 Other antepartum hemorrhage, second trimester: Secondary | ICD-10-CM | POA: Diagnosis not present

## 2015-12-24 DIAGNOSIS — Z3A17 17 weeks gestation of pregnancy: Secondary | ICD-10-CM | POA: Diagnosis not present

## 2015-12-24 DIAGNOSIS — O418X20 Other specified disorders of amniotic fluid and membranes, second trimester, not applicable or unspecified: Secondary | ICD-10-CM

## 2015-12-24 DIAGNOSIS — Z87891 Personal history of nicotine dependence: Secondary | ICD-10-CM | POA: Diagnosis not present

## 2015-12-24 DIAGNOSIS — O2342 Unspecified infection of urinary tract in pregnancy, second trimester: Secondary | ICD-10-CM | POA: Insufficient documentation

## 2015-12-24 DIAGNOSIS — O209 Hemorrhage in early pregnancy, unspecified: Secondary | ICD-10-CM | POA: Diagnosis present

## 2015-12-24 HISTORY — DX: Headache, unspecified: R51.9

## 2015-12-24 HISTORY — DX: Headache: R51

## 2015-12-24 LAB — URINE MICROSCOPIC-ADD ON: RBC / HPF: NONE SEEN RBC/hpf (ref 0–5)

## 2015-12-24 LAB — URINALYSIS, ROUTINE W REFLEX MICROSCOPIC
BILIRUBIN URINE: NEGATIVE
Glucose, UA: NEGATIVE mg/dL
KETONES UR: NEGATIVE mg/dL
Nitrite: POSITIVE — AB
PROTEIN: NEGATIVE mg/dL
Specific Gravity, Urine: <1.005 — ABNORMAL LOW (ref 1.005–1.030)
pH: 5.5 (ref 5.0–8.0)

## 2015-12-24 MED ORDER — NITROFURANTOIN MONOHYD MACRO 100 MG PO CAPS: 100.0000 mg | ORAL_CAPSULE | Freq: Two times a day (BID) | ORAL | Status: DC

## 2015-12-24 NOTE — Discharge Instructions (Signed)
Subchorionic Hematoma °A subchorionic hematoma is a gathering of blood between the outer wall of the placenta and the inner wall of the womb (uterus). The placenta is the organ that connects the fetus to the wall of the uterus. The placenta performs the feeding, breathing (oxygen to the fetus), and waste removal (excretory work) of the fetus.  °Subchorionic hematoma is the most common abnormality found on a result from ultrasonography done during the first trimester or early second trimester of pregnancy. If there has been little or no vaginal bleeding, early small hematomas usually shrink on their own and do not affect your baby or pregnancy. The blood is gradually absorbed over 1-2 weeks. When bleeding starts later in pregnancy or the hematoma is larger or occurs in an older pregnant woman, the outcome may not be as good. Larger hematomas may get bigger, which increases the chances for miscarriage. Subchorionic hematoma also increases the risk of premature detachment of the placenta from the uterus, preterm (premature) labor, and stillbirth. °HOME CARE INSTRUCTIONS °· Stay on bed rest if your health care provider recommends this. Although bed rest will not prevent more bleeding or prevent a miscarriage, your health care provider may recommend bed rest until you are advised otherwise. °· Avoid heavy lifting (more than 10 lb [4.5 kg]), exercise, sexual intercourse, or douching as directed by your health care provider. °· Keep track of the number of pads you use each day and how soaked (saturated) they are. Write down this information. °· Do not use tampons. °· Keep all follow-up appointments as directed by your health care provider. Your health care provider may ask you to have follow-up blood tests or ultrasound tests or both. °SEEK IMMEDIATE MEDICAL CARE IF: °· You have severe cramps in your stomach, back, abdomen, or pelvis. °· You have a fever. °· You pass large clots or tissue. Save any tissue for your health  care provider to look at. °· Your bleeding increases or you become lightheaded, feel weak, or have fainting episodes. °  °This information is not intended to replace advice given to you by your health care provider. Make sure you discuss any questions you have with your health care provider. °  °Document Released: 01/09/2007 Document Revised: 10/15/2014 Document Reviewed: 04/23/2013 °Elsevier Interactive Patient Education ©2016 Elsevier Inc. ° °Pelvic Rest °Pelvic rest is sometimes recommended for women when:  °· The placenta is partially or completely covering the opening of the cervix (placenta previa). °· There is bleeding between the uterine wall and the amniotic sac in the first trimester (subchorionic hemorrhage). °· The cervix begins to open without labor starting (incompetent cervix, cervical insufficiency). °· The labor is too early (preterm labor). °HOME CARE INSTRUCTIONS °· Do not have sexual intercourse, stimulation, or an orgasm. °· Do not use tampons, douche, or put anything in the vagina. °· Do not lift anything over 10 pounds (4.5 kg). °· Avoid strenuous activity or straining your pelvic muscles. °SEEK MEDICAL CARE IF:  °· You have any vaginal bleeding during pregnancy. Treat this as a potential emergency. °· You have cramping pain felt low in the stomach (stronger than menstrual cramps). °· You notice vaginal discharge (watery, mucus, or bloody). °· You have a low, dull backache. °· There are regular contractions or uterine tightening. °SEEK IMMEDIATE MEDICAL CARE IF: °You have vaginal bleeding and have placenta previa.  °  °This information is not intended to replace advice given to you by your health care provider. Make sure you discuss any questions you have   with your health care provider. °  °Document Released: 01/19/2011 Document Revised: 12/17/2011 Document Reviewed: 03/28/2015 °Elsevier Interactive Patient Education ©2016 Elsevier Inc. ° ° ° ° ° °

## 2015-12-24 NOTE — MAU Note (Signed)
Hx subchorionic hemorrhage. Has had some bleeding off and on since then. Was seen first of March with a lot of bleeding after intercourse. No sex since then but spotting off and on. Spotting is light and almost orange color now. Mild cramping

## 2015-12-24 NOTE — Progress Notes (Signed)
Ivonne AndrewV. Smith CNM in earlier to see pt and discuss d/c plan. Written and verbal d/c instructions given and understanding voiced

## 2015-12-24 NOTE — MAU Provider Note (Signed)
Chief Complaint: Vaginal Bleeding   First Provider Initiated Contact with Patient 12/24/15 0600     SUBJECTIVE HPI: Cynthia Valenzuela is a 20 y.o. G3P0020 at [redacted]w[redacted]d who presents to Maternity Admissions reporting continue scant spotting since 12/07/15. Had a moderate bleeding episode at that time for which she was evaluated in MAU. Dx w/ [facility] at multiple previous US's.   Blood type A pos  Quality: Scant spotting w/ wiping Duration: 18 days (this episode) Course: Improving Associated signs and symptoms: Neg for abd pain, vaginal discharge, LOF.   Past Medical History  Diagnosis Date  . Obesities, morbid (HCC)   . Headache    OB History  Gravida Para Term Preterm AB SAB TAB Ectopic Multiple Living  3 0 0 0 0      # Outcome Date GA Lbr Len/2nd Weight Sex Delivery Anes PTL Lv  3 Current           2 SAB 04/2015          1 TAB 11/2014             Past Surgical History  Procedure Laterality Date  . No past surgeries    . Wisdom tooth extraction     Social History   Social History  . Marital Status: Single    Spouse Name: N/A  . Number of Children: N/A  . Years of Education: N/A   Occupational History  . Not on file.   Social History Main Topics  . Smoking status: Former Smoker    Quit date: 09/30/2015  . Smokeless tobacco: Never Used  . Alcohol Use: No  . Drug Use: No     Comment: 04/22/2015 quit using marijuana   . Sexual Activity: Yes    Birth Control/ Protection: None   Other Topics Concern  . Not on file   Social History Narrative   ** Merged History Encounter **       No current facility-administered medications on file prior to encounter.   Current Outpatient Prescriptions on File Prior to Encounter  Medication Sig Dispense Refill  . acetaminophen (TYLENOL) 500 MG tablet Take 1,000 mg by mouth every 6 (six) hours as needed for moderate pain.    . Prenatal Vit-Fe Fumarate-FA (PRENATAL COMPLETE) 14-0.4 MG TABS Take 2 tablets by mouth daily. 60 each 10   . Butalbital-APAP-Caffeine 50-325-40 MG capsule Take 1-2 capsules by mouth every 6 (six) hours as needed for headache. 30 capsule 3   No Known Allergies  I have reviewed the past Medical Hx, Surgical Hx, Social Hx, Allergies and Medications.   Review of Systems  Gastrointestinal: Negative for abdominal pain.  Genitourinary: Positive for vaginal bleeding. Negative for hematuria, vaginal discharge, vaginal pain and pelvic pain.  Musculoskeletal: Negative for back pain.    OBJECTIVE Patient Vitals for the past 24 hrs:  BP Temp Pulse Resp Height Weight  12/24/15 0539 120/74 mmHg - 97 - - -  12/24/15 0537 - 98.1 F (36.7 C) - 18  (1.651 m) 294 lb 9.6 oz (133.63 kg)   Constitutional: Well-developed, well-nourished female in no acute distress.  Cardiovascular: normal rate Respiratory: normal rate and effort.  GI: Abd soft, non-tender, gravid appropriate for gestational age. Neurologic: Alert and oriented x 4.  GU: Offered cervical exam, declined  FHR 155 by doppler.   LAB RESULTS No results found for this or any previous visit (from the past 24 hour(s)).  IMAGING US Ob Comp Less 14 Wks  11/28/2015  CLINICAL DATA:  Acute onset of vaginal bleeding.  Initial encounter. EXAM: OBSTETRIC <14 WK ULTRASOUND TECHNIQUE: Transabdominal ultrasound was performed for evaluation of the gestation as well as the maternal uterus and adnexal regions. COMPARISON:  Pelvic ultrasound performed 11/23/2015 FINDINGS: Intrauterine gestational sac: Visualized/normal in shape. Yolk sac:  No Embryo:  Yes Cardiac Activity: Yes Heart Rate: 166 bpm CRL:   6.2 cm   12 w 4 d                  US EDC: 06/07/2016 Subchorionic hemorrhage:  None visualized. Maternal uterus/adnexae: The uterus is otherwise unremarkable. The ovaries are not visualized on this study. No free fluid is seen within the pelvic cul-de-sac. IMPRESSION: Single live intrauterine pregnancy noted, with a crown-rump length of 6.2 cm, corresponding to a  gestational age of [redacted] weeks 4 days. The gestational age of [redacted] weeks 2 days by prior ultrasound reflects an estimated date of delivery of June 02, 2016, which is being used as the clinical estimated date of delivery. Electronically Signed   By: Roanna RaiderJeffery  Chang M.D.   On: 11/28/2015 04:27    MAU COURSE  MDM - Scant spotting w/ known SCH, improving. Pos FHT's.   ASSESSMENT 1. Subchorionic hemorrhage in second trimester     PLAN Discharge home in stable condition. Bleeding Precautions Pelvic rest  Follow-up Information    [facility] On 01/02/2016.   Specialty:  Obstetrics and Gynecology   Why:  Routine prenatal visit or sooner as needed if symptoms worsen   Contact information:   [address] [place] [id number] [phone]      [facility] with [facility].   Why:  As needed in emergencies   Contact information:   [address] [id number] [address]c [place] [id number] [phone]        Medication List    STOP taking these medications        Butalbital-APAP-Caffeine 50-325-40 MG capsule      TAKE these medications        acetaminophen 500 MG tablet  Commonly known as:  TYLENOL  Take 1,000 mg by mouth every 6 (six) hours as needed for moderate pain.     PRENATAL COMPLETE 14-0.4 MG Tabs  Take 2 tablets by mouth daily.       Dorathy KinsmanVirginia Lesli Issa, CNM 12/24/2015  6:00 AM

## 2015-12-24 NOTE — Telephone Encounter (Signed)
Dx UTI. Rx Macrobid.  

## 2015-12-27 NOTE — Telephone Encounter (Signed)
I have left a message for patient to call us back regarding

## 2015-12-29 NOTE — Telephone Encounter (Signed)
Patient has been informed of UTI symptoms and will pick up rx

## 2016-01-02 ENCOUNTER — Ambulatory Visit (INDEPENDENT_AMBULATORY_CARE_PROVIDER_SITE_OTHER): Payer: Medicaid Other | Admitting: Advanced Practice Midwife

## 2016-01-02 VITALS — BP 116/58 | HR 91 | Temp 98.1°F | Wt 293.1 lb

## 2016-01-02 DIAGNOSIS — Z3482 Encounter for supervision of other normal pregnancy, second trimester: Secondary | ICD-10-CM | POA: Diagnosis not present

## 2016-01-02 DIAGNOSIS — Z3492 Encounter for supervision of normal pregnancy, unspecified, second trimester: Secondary | ICD-10-CM

## 2016-01-02 LAB — POCT URINALYSIS DIP (DEVICE)
Bilirubin Urine: NEGATIVE
Glucose, UA: NEGATIVE mg/dL
Hgb urine dipstick: NEGATIVE
KETONES UR: NEGATIVE mg/dL
Leukocytes, UA: NEGATIVE
Nitrite: NEGATIVE
PH: 7 (ref 5.0–8.0)
PROTEIN: NEGATIVE mg/dL
Specific Gravity, Urine: 1.025 (ref 1.005–1.030)
UROBILINOGEN UA: 0.2 mg/dL (ref 0.0–1.0)

## 2016-01-02 NOTE — Patient Instructions (Signed)
Alpha-Fetoprotein Test  WHY AM I HAVING THIS TEST?  This test is used to screen for birth defects, such as chromosomal abnormalities, neural tube defects, and body wall defects. It can also be used as a tumor marker for certain cancers.   Alpha-Fetoprotein (AFP) is a protein that is made by the fetal liver. Levels can be detected during pregnancy, starting at [redacted] weeks gestation and peaking at 16-[redacted] weeks gestation. Your health care provider may perform this test if you are pregnant or if a tumor is suspected.  WHAT KIND OF SAMPLE IS TAKEN?  A blood sample is required for this test. It is usually collected by inserting a needle into a vein.  HOW DO I PREPARE FOR THE TEST?  There is no preparation or fasting required for this test.  WHAT ARE THE REFERENCE RANGES?  References ranges are considered healthy ranges established after testing a large group of healthy people. Reference ranges may vary among different people, labs, and hospitals. It is your responsibility to obtain your test results. Ask the lab or department performing the test when and how you will get your results.  Reference ranges for AFP are the following:  · Adult: Less than 40 ng/mL or less than 40 mcg/L (SI units).  · Child younger than 1 year: Less than 30 ng/mL.  Ranges are stratified by weeks of gestation, and they vary among laboratories.  WHAT DO THE RESULTS MEAN?  Values above the reference ranges in pregnant women may indicate:  · Neural tube defects.  · Abdominal wall defects.  · Multiple pregnancy.  · Congenital abnormalities.  · Fetal distress or fetal death.  Values above the reference ranges in nonpregnant women may indicate:  · Reproductive cancers.  · Liver cancer.  · Liver cell death.  · Other types of cancer.  Values below the reference ranges in pregnant women may indicate:  · Down syndrome.  · Fetal death.  Talk with your health care provider to discuss your results, treatment options, and if necessary, the need for more tests. Talk  with your health care provider if you have any questions about your results.     This information is not intended to replace advice given to you by your health care provider. Make sure you discuss any questions you have with your health care provider.     Document Released: 10/18/2004 Document Revised: 10/15/2014 Document Reviewed: 02/26/2014  Elsevier Interactive Patient Education ©2016 Elsevier Inc.

## 2016-01-02 NOTE — Progress Notes (Signed)
Subjective:  Cynthia Valenzuela is a 20 y.o. G3P0020 at 7276w2d being seen today for ongoing prenatal care.  She is currently monitored for the following issues for this low-risk pregnancy and has Supervision of normal first pregnancy; Morbid obesity (HCC); Bleeding in early pregnancy; Frequent headaches; UTI (urinary tract infection) in pregnancy in second trimester; and Supervision of normal pregnancy in second trimester on her problem list.  Patient reports occasional reflux, occasional LUQ pain.  Contractions: Not present. Vag. Bleeding: None.  Movement: Present. Denies leaking of fluid.   The following portions of the patient's history were reviewed and updated as appropriate: allergies, current medications, past family history, past medical history, past social history, past surgical history and problem list. Problem list updated.  Objective:   Filed Vitals:   01/02/16 1537  BP: 116/58  Pulse: 91  Temp: 98.1 F (36.7 C)  Weight: 293 lb 1.6 oz (132.949 kg)    Fetal Status: Fetal Heart Rate (bpm): 150   Movement: Present     General:  Alert, oriented and cooperative. Patient is in no acute distress.  Skin: Skin is warm and dry. No rash noted.   Cardiovascular: Normal heart rate noted  Respiratory: Normal respiratory effort, no problems with respiration noted  Abdomen: Soft, gravid, appropriate for gestational age. Pain/Pressure: Present     Pelvic: Vag. Bleeding: None     Cervical exam deferred        Extremities: Normal range of motion.  Edema: None  Mental Status: Normal mood and affect. Normal behavior. Normal judgment and thought content.   Urinalysis: Urine Protein: Negative Urine Glucose: Negative  Assessment and Plan:  Pregnancy: G3P0020 at 7376w2d  1. Supervision of normal pregnancy in second trimester    Will do AFP only today - AFP, Quad Screen  Preterm labor symptoms and general obstetric precautions including but not limited to vaginal bleeding, contractions, leaking  of fluid and fetal movement were reviewed in detail with the patient. Please refer to After Visit Summary for other counseling recommendations.  RTC 4 weeks US scheduled for anatomy  Aviva SignsMarie L Williams, CNM

## 2016-01-03 LAB — AFP, QUAD SCREEN
AFP: 29.8 ng/mL
Age Alone: 1:1170 {titer}
CURR GEST AGE: 17.4 wks.days
Down Syndrome Scr Risk Est: 1:16500 {titer}
HCG TOTAL: 28.16 [IU]/mL
INH: 124.9 pg/mL
Interpretation-AFP: NEGATIVE
MOM FOR HCG: 1.65
MOM FOR INH: 1.35
MoM for AFP: 1.17
Open Spina bifida: NEGATIVE
Osb Risk: 1:7450 {titer}
Tri 18 Scr Risk Est: NEGATIVE
Trisomy 18 (Edward) Syndrome Interp.: 1:155000 {titer}
UE3 MOM: 1.15
uE3 Value: 1.2 ng/mL

## 2016-01-19 ENCOUNTER — Other Ambulatory Visit: Payer: Self-pay | Admitting: Advanced Practice Midwife

## 2016-01-19 ENCOUNTER — Ambulatory Visit (HOSPITAL_COMMUNITY)
Admission: RE | Admit: 2016-01-19 | Discharge: 2016-01-19 | Disposition: A | Discharge status: Home / Self Care | Payer: Medicaid Other | Source: Ambulatory Visit | Attending: Advanced Practice Midwife | Admitting: Advanced Practice Midwife

## 2016-01-19 DIAGNOSIS — O99212 Obesity complicating pregnancy, second trimester: Secondary | ICD-10-CM | POA: Insufficient documentation

## 2016-01-19 DIAGNOSIS — Z36 Encounter for antenatal screening of mother: Secondary | ICD-10-CM | POA: Insufficient documentation

## 2016-01-19 DIAGNOSIS — Z3A20 20 weeks gestation of pregnancy: Secondary | ICD-10-CM | POA: Diagnosis not present

## 2016-01-19 DIAGNOSIS — O0992 Supervision of high risk pregnancy, unspecified, second trimester: Secondary | ICD-10-CM

## 2016-01-19 DIAGNOSIS — O209 Hemorrhage in early pregnancy, unspecified: Secondary | ICD-10-CM

## 2016-01-19 DIAGNOSIS — O26872 Cervical shortening, second trimester: Secondary | ICD-10-CM

## 2016-01-20 ENCOUNTER — Other Ambulatory Visit (HOSPITAL_COMMUNITY): Payer: Self-pay | Admitting: *Deleted

## 2016-01-20 DIAGNOSIS — O26879 Cervical shortening, unspecified trimester: Secondary | ICD-10-CM

## 2016-01-20 DIAGNOSIS — O26872 Cervical shortening, second trimester: Secondary | ICD-10-CM | POA: Insufficient documentation

## 2016-01-20 DIAGNOSIS — Z0489 Encounter for examination and observation for other specified reasons: Secondary | ICD-10-CM

## 2016-01-20 DIAGNOSIS — IMO0002 Reserved for concepts with insufficient information to code with codable children: Secondary | ICD-10-CM

## 2016-01-24 ENCOUNTER — Encounter (HOSPITAL_COMMUNITY): Payer: Self-pay

## 2016-01-24 ENCOUNTER — Encounter (HOSPITAL_COMMUNITY): Payer: Self-pay | Admitting: *Deleted

## 2016-01-24 ENCOUNTER — Ambulatory Visit (HOSPITAL_COMMUNITY)
Admission: RE | Admit: 2016-01-24 | Discharge: 2016-01-24 | Disposition: A | Discharge status: Home / Self Care | Payer: Medicaid Other | Source: Ambulatory Visit | Attending: Advanced Practice Midwife | Admitting: Advanced Practice Midwife

## 2016-01-24 ENCOUNTER — Inpatient Hospital Stay (HOSPITAL_COMMUNITY)
Admission: AD | Admit: 2016-01-24 | Discharge: 2016-01-25 | DRG: 782 | Payer: Medicaid Other | Source: Ambulatory Visit | Attending: Obstetrics & Gynecology | Admitting: Obstetrics & Gynecology

## 2016-01-24 DIAGNOSIS — O99212 Obesity complicating pregnancy, second trimester: Secondary | ICD-10-CM | POA: Diagnosis present

## 2016-01-24 DIAGNOSIS — Z87891 Personal history of nicotine dependence: Secondary | ICD-10-CM

## 2016-01-24 DIAGNOSIS — O209 Hemorrhage in early pregnancy, unspecified: Secondary | ICD-10-CM

## 2016-01-24 DIAGNOSIS — Z811 Family history of alcohol abuse and dependence: Secondary | ICD-10-CM

## 2016-01-24 DIAGNOSIS — O3432 Maternal care for cervical incompetence, second trimester: Secondary | ICD-10-CM | POA: Diagnosis present

## 2016-01-24 DIAGNOSIS — N883 Incompetence of cervix uteri: Secondary | ICD-10-CM | POA: Diagnosis present

## 2016-01-24 DIAGNOSIS — O0992 Supervision of high risk pregnancy, unspecified, second trimester: Secondary | ICD-10-CM

## 2016-01-24 DIAGNOSIS — Z3A21 21 weeks gestation of pregnancy: Secondary | ICD-10-CM | POA: Insufficient documentation

## 2016-01-24 DIAGNOSIS — Z6841 Body Mass Index (BMI) 40.0 and over, adult: Secondary | ICD-10-CM

## 2016-01-24 DIAGNOSIS — O26872 Cervical shortening, second trimester: Secondary | ICD-10-CM

## 2016-01-24 DIAGNOSIS — O26879 Cervical shortening, unspecified trimester: Secondary | ICD-10-CM

## 2016-01-24 DIAGNOSIS — O2342 Unspecified infection of urinary tract in pregnancy, second trimester: Secondary | ICD-10-CM | POA: Diagnosis not present

## 2016-01-24 LAB — URINALYSIS, ROUTINE W REFLEX MICROSCOPIC
Bilirubin Urine: NEGATIVE
Glucose, UA: NEGATIVE mg/dL
Ketones, ur: 15 mg/dL — AB
Nitrite: NEGATIVE
PROTEIN: NEGATIVE mg/dL
SPECIFIC GRAVITY, URINE: 1.02 (ref 1.005–1.030)
pH: 7 (ref 5.0–8.0)

## 2016-01-24 LAB — CBC
HCT: 33.8 % — ABNORMAL LOW (ref 36.0–46.0)
HEMOGLOBIN: 11.4 g/dL — AB (ref 12.0–15.0)
MCH: 28.8 pg (ref 26.0–34.0)
MCHC: 33.7 g/dL (ref 30.0–36.0)
MCV: 85.4 fL (ref 78.0–100.0)
PLATELETS: 363 10*3/uL (ref 150–400)
RBC: 3.96 MIL/uL (ref 3.87–5.11)
RDW: 14.4 % (ref 11.5–15.5)
WBC: 7.7 10*3/uL (ref 4.0–10.5)

## 2016-01-24 LAB — URINE MICROSCOPIC-ADD ON

## 2016-01-24 LAB — WET PREP, GENITAL
SPERM: NONE SEEN
Trich, Wet Prep: NONE SEEN
YEAST WET PREP: NONE SEEN

## 2016-01-24 LAB — RAPID URINE DRUG SCREEN, HOSP PERFORMED
Amphetamines: NOT DETECTED
BARBITURATES: NOT DETECTED
BENZODIAZEPINES: NOT DETECTED
COCAINE: NOT DETECTED
Opiates: NOT DETECTED
Tetrahydrocannabinol: NOT DETECTED

## 2016-01-24 MED ORDER — DOCUSATE SODIUM 100 MG PO CAPS
100.0000 mg | ORAL_CAPSULE | Freq: Every day | ORAL | Status: DC
  Administered 2016-01-25: 100 mg via ORAL
  Filled 2016-01-24: qty 1

## 2016-01-24 MED ORDER — ACETAMINOPHEN 325 MG PO TABS: 650.0000 mg | ORAL_TABLET | ORAL | Status: DC | PRN

## 2016-01-24 MED ORDER — PROGESTERONE MICRONIZED 200 MG PO CAPS
200.0000 mg | ORAL_CAPSULE | Freq: Every day | ORAL | Status: DC
  Administered 2016-01-24: 200 mg via VAGINAL
  Filled 2016-01-24 (×3): qty 1

## 2016-01-24 MED ORDER — INDOMETHACIN 25 MG PO CAPS
50.0000 mg | ORAL_CAPSULE | Freq: Once | ORAL | Status: AC
  Administered 2016-01-24: 50 mg via ORAL
  Filled 2016-01-24: qty 2

## 2016-01-24 MED ORDER — PRENATAL MULTIVITAMIN CH
1.0000 | ORAL_TABLET | Freq: Every day | ORAL | Status: DC
  Administered 2016-01-25: 1 via ORAL
  Filled 2016-01-24: qty 1

## 2016-01-24 MED ORDER — SODIUM CHLORIDE 0.9% FLUSH
3.0000 mL | Freq: Two times a day (BID) | INTRAVENOUS | Status: DC
  Administered 2016-01-24 – 2016-01-25 (×2): 3 mL via INTRAVENOUS

## 2016-01-24 MED ORDER — CALCIUM CARBONATE ANTACID 500 MG PO CHEW: 2.0000 | CHEWABLE_TABLET | ORAL | Status: DC | PRN

## 2016-01-24 MED ORDER — SODIUM CHLORIDE 0.9 % IV SOLN
250.0000 mL | INTRAVENOUS | Status: DC | PRN
  Administered 2016-01-24: 250 mL via INTRAVENOUS

## 2016-01-24 MED ORDER — SODIUM CHLORIDE 0.9% FLUSH: 3.0000 mL | INTRAVENOUS | Status: DC | PRN

## 2016-01-24 MED ORDER — INDOMETHACIN 25 MG PO CAPS
25.0000 mg | ORAL_CAPSULE | Freq: Four times a day (QID) | ORAL | Status: DC
Start: 2016-01-25 — End: 2016-01-25
  Administered 2016-01-24 – 2016-01-25 (×3): 25 mg via ORAL
  Filled 2016-01-24 (×7): qty 1

## 2016-01-24 MED ORDER — ZOLPIDEM TARTRATE 5 MG PO TABS: 5.0000 mg | ORAL_TABLET | Freq: Every evening | ORAL | Status: DC | PRN

## 2016-01-24 NOTE — H&P (Addendum)
  Cynthia GuestVeronica Valenzuela is an 20 y.o. [phone] 5329w3d female.   Chief Complaint: short cervix HPI: Patient seen in MFM with cervical length of 1.3 cm. Started on Prometrium and f/u in 1 wk today, revealed no measurable cervix with funnel to end and fetal parts here.  Prenatal care in [facility].  Past Medical History  Diagnosis Date  . Obesities, morbid (HCC)   . Headache     Past Surgical History  Procedure Laterality Date  . No past surgeries    . Wisdom tooth extraction      Family History  Problem Relation Age of Onset  . Alcohol abuse Father    Social History:  reports that she quit smoking about 3 months ago. She has never used smokeless tobacco. She reports that she does not drink alcohol or use illicit drugs.   No Known Allergies  No current facility-administered medications on file prior to encounter.   Current Outpatient Prescriptions on File Prior to Encounter  Medication Sig Dispense Refill  . acetaminophen (TYLENOL) 500 MG tablet Take 1,000 mg by mouth every 6 (six) hours as needed for moderate pain.    . nitrofurantoin, macrocrystal-monohydrate, (MACROBID) 100 MG capsule Take 1 capsule (100 mg total) by mouth 2 (two) times daily. (Patient not taking: Reported on 01/24/2016) 14 capsule 1  . Prenatal Vit-Fe Fumarate-FA (PRENATAL COMPLETE) 14-0.4 MG TABS Take 2 tablets by mouth daily. 60 each 10    A comprehensive review of systems was negative.  Last menstrual period 08/21/2015, unknown if currently breastfeeding. LMP 08/21/2015 (Exact Date) General appearance: alert, cooperative, appears stated age and moderately obese Neck: supple, symmetrical, trachea midline Lungs: normal effort Heart: regular rate and rhythm Abdomen: gravid, NT Pelvic: mucous in vagina, membranes noted at external os, appears to be 1.5 cm dilated. Small parts felt on exam, cervix is soft and thin. Skin: loss of pigment noted on intertrigonal region Neurologic: Grossly normal   Lab Results   Component Value Date   WBC 7.5 11/08/2015   HGB 11.7* 11/08/2015   HCT 37.0 11/08/2015   MCV 88.1 11/08/2015   PLT 377 11/08/2015         ABO, Rh: A/POS/-- (01/31 0950)  Antibody: NEG (01/31 0950)  Rubella: !Error!  RPR: NON REAC (01/31 0950)  HBsAg: NEGATIVE (01/31 0950)  HIV: NONREACTIVE (01/31 0950)  GBS:       Assessment/Plan Principal Problem:   Cervical shortening affecting pregnancy in second trimester, antepartum  Discussed care with MFM--admit for 24 hours of indocin and perform emergent cerclage in am.  Cynthia Valenzuela S 01/24/2016, 4:46 PM

## 2016-01-25 ENCOUNTER — Encounter (HOSPITAL_COMMUNITY)
Admission: AD | Disposition: A | Discharge status: Other Acute Inpatient Hospital | Payer: Self-pay | Source: Ambulatory Visit | Attending: Family Medicine

## 2016-01-25 ENCOUNTER — Encounter (HOSPITAL_COMMUNITY): Payer: Self-pay | Admitting: Anesthesiology

## 2016-01-25 DIAGNOSIS — O26872 Cervical shortening, second trimester: Secondary | ICD-10-CM

## 2016-01-25 DIAGNOSIS — O3432 Maternal care for cervical incompetence, second trimester: Principal | ICD-10-CM

## 2016-01-25 DIAGNOSIS — N883 Incompetence of cervix uteri: Secondary | ICD-10-CM | POA: Diagnosis present

## 2016-01-25 SURGERY — CERCLAGE, CERVIX, VAGINAL APPROACH
Anesthesia: Spinal

## 2016-01-25 NOTE — Anesthesia Preprocedure Evaluation (Deleted)
Anesthesia Evaluation  Patient identified by MRN, date of birth, ID band Patient awake    Reviewed: Allergy & Precautions, NPO status , Patient's Chart, lab work & pertinent test results  Airway        Dental   Pulmonary former smoker (quit 09/2015),           Cardiovascular negative cardio ROS       Neuro/Psych negative psych ROS   GI/Hepatic negative GI ROS, Neg liver ROS,   Endo/Other  Morbid obesityBMI 50  Renal/GU negative Renal ROS   Short CX    Musculoskeletal negative musculoskeletal ROS (+)   Abdominal   Peds  Hematology 11/34 plts 363   Anesthesia Other Findings   Reproductive/Obstetrics                             Anesthesia Physical Anesthesia Plan  ASA: II  Anesthesia Plan: Spinal   Post-op Pain Management:    Induction:   Airway Management Planned: Nasal Cannula  Additional Equipment:   Intra-op Plan:   Post-operative Plan:   Informed Consent: I have reviewed the patients History and Physical, chart, labs and discussed the procedure including the risks, benefits and alternatives for the proposed anesthesia with the patient or authorized representative who has indicated his/her understanding and acceptance.     Plan Discussed with:   Anesthesia Plan Comments:         Anesthesia Quick Evaluation
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
## 2016-01-25 NOTE — Discharge Instructions (Signed)
Cervical Insufficiency °Cervical insufficiency is when the cervix is weak and starts to open (dilate) and thin (efface) before the pregnancy is at term and without labor starting. This is also called incompetent cervix. It can happen in the second or third trimester when the fetus starts putting pressure on the cervix. Cervical insufficiency can lead to a miscarriage, preterm premature rupture of the membranes (PPROM), or having the baby early (preterm birth).  °RISK FACTORS °You may be more likely to develop cervical insufficiency if: °· You have a shorter cervix than normal. °· Damage or injury occurred to your cervix from a past pregnancy or surgery. °· You were born with a cervical defect. °· You have had a procedure done on the cervix, such as cervical biopsy. °· You have a history of cervical insufficiency. °· You have a history of PPROM. °· You have ended several past pregnancies through abortion. °· You were exposed to the drug diethylstilbestrol (DES). °SYMPTOMS °Often times, women do not have any symptoms. Other times, women may only have mild symptoms that often start between week 14 through 20. The symptoms may last several days or weeks. These symptoms include: °· Light spotting or bleeding from the vagina. °· Pelvic pressure. °· A change in vaginal discharge, such as discharge that changes from clear, white, or light yellow to pink or tan. °· Back pain. °· Abdominal pain or cramping. °DIAGNOSIS °Cervical insufficiency cannot be diagnosed before you become pregnant. Once you are pregnant, your health care provider will ask about your medical history and if you have had any problems in past pregnancies. Tell your health care provider about any procedures performed on your cervix or if you have a history of miscarriages or cervical insufficiency. If your health care provider thinks you are at high risk for cervical insufficiency or show signs of cervical insufficiency, he or she may: °· Perform a pelvic  exam. This will check for: °¨ The presence of the membranes (amniotic sac) coming out of the cervix. °¨ Cervical abnormalities. °¨ Cervical injuries. °¨ The presence of contractions. °· Perform an ultrasonography (commonly called ultrasound) to measure the length and thickness of the cervix. °TREATMENT °If you have been diagnosed with cervical insufficiency, your health care provider may recommend: °· Limiting physical activity. °· Bed rest at home or in the hospital. °· Pelvic rest, which means no sexual intercourse or placing anything in the vagina. °· Cerclage to sew the cervix closed and prevent it from opening too early. The stitches (sutures) are removed between weeks 36 and 38 to avoid problems during labor. °Cerclage may be recommended during pregnancy if you have had a history of miscarriages or preterm births without a known cause. It may also be recommended if you have a short cervix that was identified by ultrasound or if your health care provider has found that your cervix has dilated before 24 weeks of pregnancy. Limiting physical activity and bed rest may or may not help prevent a preterm birth. °WHEN SHOULD YOU SEEK IMMEDIATE MEDICAL CARE?  °Seek immediate medical care if you show any symptoms of cervical insufficiency. You will need to go to the hospital to get checked immediately. °  °This information is not intended to replace advice given to you by your health care provider. Make sure you discuss any questions you have with your health care provider. °  °Document Released: 09/24/2005 Document Revised: 10/15/2014 Document Reviewed: 12/01/2012 °Elsevier Interactive Patient Education ©2016 Elsevier Inc. ° °

## 2016-01-25 NOTE — Progress Notes (Signed)
[facility]. Report [facility], no questions. Pt transported [facility] via stretcher. Pt vital signs stable, informed of process. Pt Comfortable and pleasant at transfer.

## 2016-01-31 ENCOUNTER — Ambulatory Visit (INDEPENDENT_AMBULATORY_CARE_PROVIDER_SITE_OTHER): Payer: Medicaid Other | Admitting: Family

## 2016-01-31 VITALS — BP 129/75 | HR 98 | Wt 294.8 lb

## 2016-01-31 DIAGNOSIS — O99612 Diseases of the digestive system complicating pregnancy, second trimester: Secondary | ICD-10-CM

## 2016-01-31 DIAGNOSIS — O0992 Supervision of high risk pregnancy, unspecified, second trimester: Secondary | ICD-10-CM

## 2016-01-31 DIAGNOSIS — O26872 Cervical shortening, second trimester: Secondary | ICD-10-CM | POA: Diagnosis present

## 2016-01-31 DIAGNOSIS — K219 Gastro-esophageal reflux disease without esophagitis: Secondary | ICD-10-CM

## 2016-01-31 LAB — POCT URINALYSIS DIP (DEVICE)
Bilirubin Urine: NEGATIVE
GLUCOSE, UA: NEGATIVE mg/dL
HGB URINE DIPSTICK: NEGATIVE
KETONES UR: NEGATIVE mg/dL
Leukocytes, UA: NEGATIVE
Nitrite: NEGATIVE
PH: 6 (ref 5.0–8.0)
PROTEIN: NEGATIVE mg/dL
SPECIFIC GRAVITY, URINE: 1.025 (ref 1.005–1.030)
UROBILINOGEN UA: 0.2 mg/dL (ref 0.0–1.0)

## 2016-01-31 MED ORDER — FAMOTIDINE 20 MG PO TABS: 20.0000 mg | ORAL_TABLET | Freq: Two times a day (BID) | ORAL | Status: DC

## 2016-01-31 NOTE — Patient Instructions (Signed)
LEG CRAMPS - Leg cramps are common, usually occurring during the latter half of pregnancy. The cramps are due to painful muscle contractions and are generally experienced in the calves at night. They are thought to be secondary to a buildup of lactic and pyruvic acids leading to involuntary contraction of the affected muscles, but the exact etiology is unknown.  The preparation used was magnesium lactate or citrate 5 mmol in the morning and 10 mmol in the evening.    Stretching exercises may be an effective preventive measure. These can be performed in the weight-bearing position; they are held for 20 seconds and repeated three times in succession, four times daily for one week, then twice daily thereafter.  If a cramp occurs, calf stretches (toe raises), walking, or leg jiggling followed by leg elevation may be helpful. Other nonpharmacologic remedies include: ?A hot shower or warm tub bath ?Ice massage ?Regular exercise for conditioning, calf strengthening and stretching ?Increased hydration ?Use of long-countered shoes and other proper foot gear.  

## 2016-01-31 NOTE — Progress Notes (Signed)
Subjective:  Cynthia GuestVeronica Valenzuela is a 20 y.o. G3P0020 at 3479w3d being seen today for ongoing prenatal care.  She is currently monitored for the following issues for this high-risk pregnancy and has Supervision of high risk pregnancy in second trimester; Morbid obesity (HCC); Bleeding in early pregnancy; Frequent headaches; UTI (urinary tract infection) in pregnancy in second trimester; Cervical shortening affecting pregnancy in second trimester, antepartum; Cervical incompetence during pregnancy in second trimester; and Incompetent cervix on her problem list.  Patient reports occasional sharp pain.  No report of vaginal bleeding.  Also reports reflux and night leg cramps.   Contractions: Not present. Vag. Bleeding: None.  Movement: Present. Denies leaking of fluid.   The following portions of the patient's history were reviewed and updated as appropriate: allergies, current medications, past family history, past medical history, past social history, past surgical history and problem list. Problem list updated.  Objective:   Filed Vitals:   01/31/16 0914  BP: 129/75  Pulse: 98  Weight: 294 lb 12.8 oz (133.72 kg)    Fetal Status: Fetal Heart Rate (bpm): 165 Fundal Height: 24 cm Movement: Present     General:  Alert, oriented and cooperative. Patient is in no acute distress.  Skin: Skin is warm and dry. No rash noted.   Cardiovascular: Normal heart rate noted  Respiratory: Normal respiratory effort, no problems with respiration noted  Abdomen: Soft, gravid, appropriate for gestational age. Pain/Pressure: Present     Pelvic: Vag. Bleeding: None     Cervical exam deferred        Extremities: Normal range of motion.  Edema: None  Mental Status: Normal mood and affect. Normal behavior. Normal judgment and thought content.   Urinalysis: Urine Protein: Negative Urine Glucose: Negative  Assessment and Plan:  Pregnancy: G3P0020 at 7479w3d  1. Supervision of high risk pregnancy in second trimester -  [facility]  2. Cervical shortening affecting pregnancy in second trimester, antepartum - Cerclage in place - Follow-up ultrasound this week  3. Gastroesophageal reflux disease, esophagitis presence not specified - famotidine (PEPCID) 20 MG tablet; Take 1 tablet (20 mg total) by mouth 2 (two) times daily.  Dispense: 30 tablet; Refill: 2  4.  Leg Cramps - Information given regarding magnesium  Preterm labor symptoms and general obstetric precautions including but not limited to vaginal bleeding, contractions, leaking of fluid and fetal movement were reviewed in detail with the patient. Please refer to After Visit Summary for other counseling recommendations.  Return in about 2 weeks (around 02/14/2016) [facility]   Eino FarberWalidah Kennith GainN Karim, CNM

## 2016-01-31 NOTE — Progress Notes (Signed)
Educated pt on Benefits of Breastfeeding for Mom  

## 2016-02-02 ENCOUNTER — Encounter (HOSPITAL_COMMUNITY): Payer: Self-pay

## 2016-02-02 ENCOUNTER — Other Ambulatory Visit (HOSPITAL_COMMUNITY): Payer: Self-pay | Admitting: *Deleted

## 2016-02-02 ENCOUNTER — Ambulatory Visit (HOSPITAL_COMMUNITY)
Admission: RE | Admit: 2016-02-02 | Discharge: 2016-02-02 | Disposition: A | Discharge status: Home / Self Care | Payer: Medicaid Other | Source: Ambulatory Visit | Attending: Advanced Practice Midwife | Admitting: Advanced Practice Midwife

## 2016-02-02 VITALS — BP 123/82 | HR 102 | Wt 295.6 lb

## 2016-02-02 DIAGNOSIS — Z3A22 22 weeks gestation of pregnancy: Secondary | ICD-10-CM | POA: Insufficient documentation

## 2016-02-02 DIAGNOSIS — O3432 Maternal care for cervical incompetence, second trimester: Secondary | ICD-10-CM | POA: Insufficient documentation

## 2016-02-02 DIAGNOSIS — O3430 Maternal care for cervical incompetence, unspecified trimester: Secondary | ICD-10-CM

## 2016-02-02 DIAGNOSIS — O99212 Obesity complicating pregnancy, second trimester: Secondary | ICD-10-CM | POA: Diagnosis not present

## 2016-02-02 DIAGNOSIS — O26872 Cervical shortening, second trimester: Secondary | ICD-10-CM | POA: Insufficient documentation

## 2016-02-05 ENCOUNTER — Encounter (HOSPITAL_COMMUNITY): Payer: Self-pay | Admitting: Certified Nurse Midwife

## 2016-02-05 ENCOUNTER — Inpatient Hospital Stay (HOSPITAL_COMMUNITY)
Admission: AD | Admit: 2016-02-05 | Discharge: 2016-02-05 | Disposition: A | Discharge status: Home / Self Care | Payer: Medicaid Other | Source: Ambulatory Visit | Attending: Obstetrics and Gynecology | Admitting: Obstetrics and Gynecology

## 2016-02-05 DIAGNOSIS — O26899 Other specified pregnancy related conditions, unspecified trimester: Secondary | ICD-10-CM | POA: Diagnosis not present

## 2016-02-05 DIAGNOSIS — R109 Unspecified abdominal pain: Secondary | ICD-10-CM

## 2016-02-05 DIAGNOSIS — O9989 Other specified diseases and conditions complicating pregnancy, childbirth and the puerperium: Secondary | ICD-10-CM

## 2016-02-05 DIAGNOSIS — Z3A00 Weeks of gestation of pregnancy not specified: Secondary | ICD-10-CM | POA: Diagnosis not present

## 2016-02-05 DIAGNOSIS — O3430 Maternal care for cervical incompetence, unspecified trimester: Secondary | ICD-10-CM | POA: Insufficient documentation

## 2016-02-05 LAB — URINALYSIS, ROUTINE W REFLEX MICROSCOPIC
Bilirubin Urine: NEGATIVE
GLUCOSE, UA: NEGATIVE mg/dL
HGB URINE DIPSTICK: NEGATIVE
Ketones, ur: NEGATIVE mg/dL
Nitrite: NEGATIVE
PROTEIN: NEGATIVE mg/dL
SPECIFIC GRAVITY, URINE: 1.01 (ref 1.005–1.030)
pH: 5.5 (ref 5.0–8.0)

## 2016-02-05 LAB — WET PREP, GENITAL
CLUE CELLS WET PREP: NONE SEEN
Sperm: NONE SEEN
TRICH WET PREP: NONE SEEN
Yeast Wet Prep HPF POC: NONE SEEN

## 2016-02-05 LAB — URINE MICROSCOPIC-ADD ON
BACTERIA UA: NONE SEEN
RBC / HPF: NONE SEEN RBC/hpf (ref 0–5)

## 2016-02-05 MED ORDER — BETAMETHASONE SOD PHOS & ACET 6 (3-3) MG/ML IJ SUSP
12.0000 mg | Freq: Once | INTRAMUSCULAR | Status: AC
Start: 2016-02-05 — End: 2016-02-05
  Administered 2016-02-05: 12 mg via INTRAMUSCULAR
  Filled 2016-02-05: qty 2

## 2016-02-05 NOTE — MAU Provider Note (Signed)
S: pt c/o mild 20-30 second periods of cramping. Denies vag bleeding or ROM. Does have vag discharge. O: no contractions palpated or tracing on EFP. SVE stitch intact, no tension on stitch, no bulgining of LUS, presenting part no in pelvis. A: incompetent cervix, stitch intact, not in labor P: wet prep, cultures, BMX  And repeat tomorrow.

## 2016-02-05 NOTE — Discharge Instructions (Signed)
Braxton Hicks Contractions °Contractions of the uterus can occur throughout pregnancy. Contractions are not always a sign that you are in labor.  °WHAT ARE BRAXTON HICKS CONTRACTIONS?  °Contractions that occur before labor are called Braxton Hicks contractions, or false labor. Toward the end of pregnancy (32-34 weeks), these contractions can develop more often and may become more forceful. This is not true labor because these contractions do not result in opening (dilatation) and thinning of the cervix. They are sometimes difficult to tell apart from true labor because these contractions can be forceful and people have different pain tolerances. You should not feel embarrassed if you go to the hospital with false labor. Sometimes, the only way to tell if you are in true labor is for your health care provider to look for changes in the cervix. °If there are no prenatal problems or other health problems associated with the pregnancy, it is completely safe to be sent home with false labor and await the onset of true labor. °HOW CAN YOU TELL THE DIFFERENCE BETWEEN TRUE AND FALSE LABOR? °False Labor °· The contractions of false labor are usually shorter and not as hard as those of true labor.   °· The contractions are usually irregular.   °· The contractions are often felt in the front of the lower abdomen and in the groin.   °· The contractions may go away when you walk around or change positions while lying down.   °· The contractions get weaker and are shorter lasting as time goes on.   °· The contractions do not usually become progressively stronger, regular, and closer together as with true labor.   °True Labor °· Contractions in true labor last 30-70 seconds, become very regular, usually become more intense, and increase in frequency.   °· The contractions do not go away with walking.   °· The discomfort is usually felt in the top of the uterus and spreads to the lower abdomen and low back.   °· True labor can be  determined by your health care provider with an exam. This will show that the cervix is dilating and getting thinner.   °WHAT TO REMEMBER °· Keep up with your usual exercises and follow other instructions given by your health care provider.   °· Take medicines as directed by your health care provider.   °· Keep your regular prenatal appointments.   °· Eat and drink lightly if you think you are going into labor.   °· If Braxton Hicks contractions are making you uncomfortable:   °¨ Change your position from lying down or resting to walking, or from walking to resting.   °¨ Sit and rest in a tub of warm water.   °¨ Drink 2-3 glasses of water. Dehydration may cause these contractions.   °¨ Do slow and deep breathing several times an hour.   °WHEN SHOULD I SEEK IMMEDIATE MEDICAL CARE? °Seek immediate medical care if: °· Your contractions become stronger, more regular, and closer together.   °· You have fluid leaking or gushing from your vagina.   °· You have a fever.   °· You pass blood-tinged mucus.   °· You have vaginal bleeding.   °· You have continuous abdominal pain.   °· You have low back pain that you never had before.   °· You feel your baby's head pushing down and causing pelvic pressure.   °· Your baby is not moving as much as it used to.   °  °This information is not intended to replace advice given to you by your health care provider. Make sure you discuss any questions you have with your health care   provider. °  °Document Released: 09/24/2005 Document Revised: 09/29/2013 Document Reviewed: 07/06/2013 °Elsevier Interactive Patient Education ©2016 Elsevier Inc. ° °

## 2016-02-05 NOTE — MAU Note (Signed)
Darlene CNM at the bedside to assess pt. Orders given to discontinue EFM and TOCO.

## 2016-02-05 NOTE — MAU Note (Signed)
Pt states she has had some abdominal cramping since Friday that got worse today. Pt had a cerclage placed 2 weeks ago. Pt denies vaginal bleeding or LOF. Fetus is active.

## 2016-02-06 ENCOUNTER — Inpatient Hospital Stay (HOSPITAL_COMMUNITY)
Admission: AD | Admit: 2016-02-06 | Discharge: 2016-02-06 | Disposition: A | Discharge status: Home / Self Care | Payer: Medicaid Other | Source: Ambulatory Visit | Attending: Family Medicine | Admitting: Family Medicine

## 2016-02-06 DIAGNOSIS — O3432 Maternal care for cervical incompetence, second trimester: Secondary | ICD-10-CM | POA: Insufficient documentation

## 2016-02-06 DIAGNOSIS — Z3A23 23 weeks gestation of pregnancy: Secondary | ICD-10-CM | POA: Diagnosis not present

## 2016-02-06 LAB — GC/CHLAMYDIA PROBE AMP (~~LOC~~) NOT AT ARMC
Chlamydia: NEGATIVE
NEISSERIA GONORRHEA: NEGATIVE

## 2016-02-06 MED ORDER — BETAMETHASONE SOD PHOS & ACET 6 (3-3) MG/ML IJ SUSP
12.0000 mg | Freq: Once | INTRAMUSCULAR | Status: AC
  Administered 2016-02-06: 12 mg via INTRAMUSCULAR
  Filled 2016-02-06: qty 2

## 2016-02-08 ENCOUNTER — Encounter (HOSPITAL_COMMUNITY): Payer: Self-pay | Admitting: *Deleted

## 2016-02-08 ENCOUNTER — Inpatient Hospital Stay (EMERGENCY_DEPARTMENT_HOSPITAL)
Admission: AD | Admit: 2016-02-08 | Discharge: 2016-02-08 | Disposition: A | Discharge status: Home / Self Care | Payer: Medicaid Other | Source: Ambulatory Visit | Attending: Obstetrics & Gynecology | Admitting: Obstetrics & Gynecology

## 2016-02-08 ENCOUNTER — Encounter: Payer: Self-pay | Admitting: *Deleted

## 2016-02-08 DIAGNOSIS — Z3A23 23 weeks gestation of pregnancy: Secondary | ICD-10-CM

## 2016-02-08 DIAGNOSIS — O4702 False labor before 37 completed weeks of gestation, second trimester: Secondary | ICD-10-CM

## 2016-02-08 LAB — URINE MICROSCOPIC-ADD ON

## 2016-02-08 LAB — URINALYSIS, ROUTINE W REFLEX MICROSCOPIC
BILIRUBIN URINE: NEGATIVE
GLUCOSE, UA: NEGATIVE mg/dL
KETONES UR: NEGATIVE mg/dL
Nitrite: NEGATIVE
PH: 6.5 (ref 5.0–8.0)
Protein, ur: NEGATIVE mg/dL
Specific Gravity, Urine: 1.015 (ref 1.005–1.030)

## 2016-02-08 MED ORDER — NIFEDIPINE 10 MG PO CAPS
30.0000 mg | ORAL_CAPSULE | Freq: Four times a day (QID) | ORAL | Status: DC | PRN
  Administered 2016-02-08: 30 mg via ORAL
  Filled 2016-02-08: qty 3

## 2016-02-08 MED ORDER — NIFEDIPINE 10 MG PO CAPS: 30.0000 mg | ORAL_CAPSULE | Freq: Four times a day (QID) | ORAL | Status: DC | PRN

## 2016-02-08 NOTE — MAU Note (Signed)
Pt c/o lower abdominal cramping about 1 week. Was evaluated Sunday and discharge. Pain is much worse now. Rates 9/10. Took 2 regular strength Tylenol-last took around 430p. Denies LOF or vag bleeding. Has incompetent cervix-takes vaginal progesterone. [facility]

## 2016-02-08 NOTE — Discharge Instructions (Signed)

## 2016-02-08 NOTE — MAU Provider Note (Signed)
History     CSN: [id number]  Arrival date and time: 02/08/16 7829   First Provider Initiated Contact with Patient 02/08/16 2042      Chief Complaint  Patient presents with  . Abdominal Cramping   HPI Cynthia Valenzuela is a 19yo G3P0020 at 23+4 who presents due to ongoing cramping.  She had a cerclage placed approximately 2.5 weeks ago and has been using vaginal progesterone nightly as directed.  She was seen in the MAU on Sunday with cramping and was noted to have no tension on the cerclage so was sent home.  She did receive a course of BMZ this week.  She has continued having contractions throughout the week that come and go but have been regular approximately every 2-3 minutes and lasting up to a minute today.  She denies LOF, vaginal bleeding, or any discharge other than the discharge in the morning from her vaginal progesterone, and she has good FM.  She has not had fevers/chills, nausea/vomiting, diarrhea, dysuria/hematuria/increased frequency/increased urgency.  Past Medical History  Diagnosis Date  . Obesities, morbid (HCC)   . Headache     Past Surgical History  Procedure Laterality Date  . No past surgeries    . Wisdom tooth extraction      Family History  Problem Relation Age of Onset  . Alcohol abuse Father     Social History  Substance Use Topics  . Smoking status: Former Smoker    Quit date: 09/30/2015  . Smokeless tobacco: Never Used  . Alcohol Use: No    Allergies: No Known Allergies  Prescriptions prior to admission  Medication Sig Dispense Refill Last Dose  . acetaminophen (TYLENOL) 325 MG tablet Take 650 mg by mouth every 6 (six) hours as needed for mild pain.   02/08/2016 at Unknown time  . famotidine (PEPCID) 20 MG tablet Take 1 tablet (20 mg total) by mouth 2 (two) times daily. 30 tablet 2 02/08/2016 at Unknown time  . Prenatal Vit-Fe Fumarate-FA (PRENATAL COMPLETE) 14-0.4 MG TABS Take 2 tablets by mouth daily. (Patient taking differently: Take 2 tablets by  mouth daily. ) 60 each 10 02/08/2016 at Unknown time  . progesterone (PROMETRIUM) 200 MG capsule Place 200 mg vaginally at bedtime.  4 02/07/2016 at Unknown time  . ranitidine (ZANTAC) 75 MG tablet Take 75 mg by mouth 2 (two) times daily.   02/08/2016 at Unknown time    ROS  No fevers/chills No chest pain/SOB No dysuria/hematuria No rash Physical Exam   Blood pressure 141/71, pulse 86, temperature 98.4 F (36.9 C), temperature source Oral, resp. rate 20, height  (1.651 m), weight 298 lb (135.172 kg), last menstrual period 08/21/2015, SpO2 100 %, unknown if currently breastfeeding.  Physical Exam Gen: alert, in NAD HEENT: NCAT, normal conjunctivae, moist oral mucosa Chest: normal WOB, lungs CTAB CV: normal rate and regular rhythm, normal S1 and S2, no m/r/g Abd: nontender, no CVA tenderness Ext: no pedal edema Skin: no rashes or lesions noted Psych: cooperative, appropriate affect  SVE: cervix with stitch in place without significant tension, q3-33min contractions on toco  MAU Course  Procedures  MDM Patient here with cerclage in place and ongoing cramping/contractions.  No s/s of GU tract or other infection, though UA was remarkable for large LE.   Assessment and Plan  19yo G3P0020 at 23+5 here with contractions in setting of cerclage placement.  -- Contractions ongoing since earlier this week without evidence of significant cervical change. -- Will prescribe nifedipine for tocolysis. --  Keep f/u as previously scheduled. -- Discharge home with labor precautions.  Cynthia Valenzuela 02/08/2016, 9:30 PM   CNM attestation:  I have seen and examined this patient; I agree with above documentation in the resident's note.   Cynthia GuestVeronica Valenzuela is a 20 y.o. [id number] reporting abd cramping +FM, denies LOF, VB, vaginal discharge.  PE: BP 134/88 mmHg  Pulse 86  Temp(Src) 98.4 F (36.9 C) (Oral)  Resp 20  Ht 5\' 5"  (1.651 m)  Wt 135.172 kg (298 lb)  BMI 49.59 kg/m2  SpO2 100%  LMP  08/21/2015 (Exact Date) Gen: calm comfortable, NAD Resp: normal effort, no distress Abd: gravid Cx: closed/stitch palp  ROS, labs, PMH reviewed NST 150s, approp for GA, irreg UI  Plan: -  preterm labor precautions rev'd -  Rx Procardia 30mg  q 4-6hrs prn ctx -  continue routine follow up in OB clinic -  Urine to culture  Cynthia Valenzuela, CNM 11:11 AM

## 2016-02-09 ENCOUNTER — Encounter (HOSPITAL_COMMUNITY): Payer: Self-pay | Admitting: *Deleted

## 2016-02-09 ENCOUNTER — Inpatient Hospital Stay (HOSPITAL_COMMUNITY)
Admission: AD | Admit: 2016-02-09 | Discharge: 2016-02-10 | DRG: 775 | Disposition: A | Discharge status: Home / Self Care | Payer: Medicaid Other | Source: Ambulatory Visit | Attending: Obstetrics & Gynecology | Admitting: Obstetrics & Gynecology

## 2016-02-09 ENCOUNTER — Observation Stay (HOSPITAL_COMMUNITY): Payer: Medicaid Other

## 2016-02-09 DIAGNOSIS — Z3A23 23 weeks gestation of pregnancy: Secondary | ICD-10-CM

## 2016-02-09 DIAGNOSIS — Z87891 Personal history of nicotine dependence: Secondary | ICD-10-CM | POA: Diagnosis not present

## 2016-02-09 DIAGNOSIS — O2342 Unspecified infection of urinary tract in pregnancy, second trimester: Secondary | ICD-10-CM | POA: Diagnosis present

## 2016-02-09 DIAGNOSIS — Z6841 Body Mass Index (BMI) 40.0 and over, adult: Secondary | ICD-10-CM

## 2016-02-09 DIAGNOSIS — O99214 Obesity complicating childbirth: Secondary | ICD-10-CM | POA: Diagnosis present

## 2016-02-09 DIAGNOSIS — O0992 Supervision of high risk pregnancy, unspecified, second trimester: Secondary | ICD-10-CM

## 2016-02-09 DIAGNOSIS — O4700 False labor before 37 completed weeks of gestation, unspecified trimester: Secondary | ICD-10-CM | POA: Diagnosis present

## 2016-02-09 DIAGNOSIS — O479 False labor, unspecified: Secondary | ICD-10-CM | POA: Diagnosis present

## 2016-02-09 DIAGNOSIS — O26872 Cervical shortening, second trimester: Secondary | ICD-10-CM | POA: Diagnosis present

## 2016-02-09 DIAGNOSIS — O3432 Maternal care for cervical incompetence, second trimester: Secondary | ICD-10-CM

## 2016-02-09 DIAGNOSIS — O09892 Supervision of other high risk pregnancies, second trimester: Secondary | ICD-10-CM | POA: Diagnosis not present

## 2016-02-09 DIAGNOSIS — N883 Incompetence of cervix uteri: Secondary | ICD-10-CM

## 2016-02-09 DIAGNOSIS — O4702 False labor before 37 completed weeks of gestation, second trimester: Secondary | ICD-10-CM | POA: Diagnosis not present

## 2016-02-09 DIAGNOSIS — O99212 Obesity complicating pregnancy, second trimester: Secondary | ICD-10-CM

## 2016-02-09 DIAGNOSIS — O3680X0 Pregnancy with inconclusive fetal viability, not applicable or unspecified: Secondary | ICD-10-CM

## 2016-02-09 LAB — TYPE AND SCREEN
ABO/RH(D): A POS
ANTIBODY SCREEN: NEGATIVE

## 2016-02-09 LAB — CBC
HCT: 35 % — ABNORMAL LOW (ref 36.0–46.0)
Hemoglobin: 11.7 g/dL — ABNORMAL LOW (ref 12.0–15.0)
MCH: 28.3 pg (ref 26.0–34.0)
MCHC: 33.4 g/dL (ref 30.0–36.0)
MCV: 84.7 fL (ref 78.0–100.0)
PLATELETS: 451 10*3/uL — AB (ref 150–400)
RBC: 4.13 MIL/uL (ref 3.87–5.11)
RDW: 14.5 % (ref 11.5–15.5)
WBC: 18.6 10*3/uL — AB (ref 4.0–10.5)

## 2016-02-09 LAB — ABO/RH: ABO/RH(D): A POS

## 2016-02-09 MED ORDER — TERBUTALINE SULFATE 1 MG/ML IJ SOLN
INTRAMUSCULAR | Status: AC
  Administered 2016-02-09: 0.25 mg
  Filled 2016-02-09: qty 1

## 2016-02-09 MED ORDER — BUTORPHANOL TARTRATE 1 MG/ML IJ SOLN: 1.0000 mg | Freq: Once | INTRAMUSCULAR | Status: DC

## 2016-02-09 MED ORDER — PROGESTERONE MICRONIZED 200 MG PO CAPS
200.0000 mg | ORAL_CAPSULE | Freq: Every day | ORAL | Status: DC
  Filled 2016-02-09: qty 1

## 2016-02-09 MED ORDER — IBUPROFEN 600 MG PO TABS
600.0000 mg | ORAL_TABLET | Freq: Four times a day (QID) | ORAL | Status: DC
  Administered 2016-02-09 – 2016-02-10 (×2): 600 mg via ORAL
  Filled 2016-02-09 (×2): qty 1

## 2016-02-09 MED ORDER — TERBUTALINE SULFATE 1 MG/ML IJ SOLN
0.2500 mg | Freq: Once | INTRAMUSCULAR | Status: AC
  Administered 2016-02-09: 0.25 mg via SUBCUTANEOUS

## 2016-02-09 MED ORDER — BENZOCAINE-MENTHOL 20-0.5 % EX AERO: 1.0000 "application " | INHALATION_SPRAY | CUTANEOUS | Status: DC | PRN

## 2016-02-09 MED ORDER — LIDOCAINE HCL (PF) 1 % IJ SOLN
INTRAMUSCULAR | Status: AC
  Filled 2016-02-09: qty 30

## 2016-02-09 MED ORDER — BUTORPHANOL TARTRATE 1 MG/ML IJ SOLN
1.0000 mg | Freq: Once | INTRAMUSCULAR | Status: AC
  Administered 2016-02-09: 1 mg via INTRAVENOUS

## 2016-02-09 MED ORDER — ACETAMINOPHEN 325 MG PO TABS: 650.0000 mg | ORAL_TABLET | ORAL | Status: DC | PRN

## 2016-02-09 MED ORDER — ONDANSETRON HCL 4 MG/2ML IJ SOLN: 4.0000 mg | INTRAMUSCULAR | Status: DC | PRN

## 2016-02-09 MED ORDER — OXYCODONE-ACETAMINOPHEN 5-325 MG PO TABS: 2.0000 | ORAL_TABLET | ORAL | Status: DC | PRN

## 2016-02-09 MED ORDER — LACTATED RINGERS IV SOLN
INTRAVENOUS | Status: DC
  Administered 2016-02-09: 11:00:00 via INTRAVENOUS
  Administered 2016-02-09: 125 mL/h via INTRAVENOUS
  Administered 2016-02-09: 06:00:00 via INTRAVENOUS

## 2016-02-09 MED ORDER — COCONUT OIL OIL: 1.0000 "application " | TOPICAL_OIL | Status: DC | PRN

## 2016-02-09 MED ORDER — SIMETHICONE 80 MG PO CHEW: 80.0000 mg | CHEWABLE_TABLET | ORAL | Status: DC | PRN

## 2016-02-09 MED ORDER — WITCH HAZEL-GLYCERIN EX PADS: 1.0000 "application " | MEDICATED_PAD | CUTANEOUS | Status: DC | PRN

## 2016-02-09 MED ORDER — MAGNESIUM SULFATE BOLUS VIA INFUSION
6.0000 g | Freq: Once | INTRAVENOUS | Status: AC
  Administered 2016-02-09: 6 g via INTRAVENOUS
  Filled 2016-02-09: qty 500

## 2016-02-09 MED ORDER — MAGNESIUM SULFATE 50 % IJ SOLN
2.0000 g/h | INTRAMUSCULAR | Status: DC
Start: 2016-02-09 — End: 2016-02-10
  Administered 2016-02-09: 2 g/h via INTRAVENOUS
  Filled 2016-02-09: qty 80

## 2016-02-09 MED ORDER — ONDANSETRON HCL 4 MG/2ML IJ SOLN: 4.0000 mg | Freq: Once | INTRAMUSCULAR | Status: DC

## 2016-02-09 MED ORDER — SENNOSIDES-DOCUSATE SODIUM 8.6-50 MG PO TABS
2.0000 | ORAL_TABLET | ORAL | Status: DC
  Filled 2016-02-09: qty 2

## 2016-02-09 MED ORDER — ONDANSETRON HCL 4 MG PO TABS: 4.0000 mg | ORAL_TABLET | ORAL | Status: DC | PRN

## 2016-02-09 MED ORDER — CALCIUM CARBONATE ANTACID 500 MG PO CHEW
2.0000 | CHEWABLE_TABLET | ORAL | Status: DC | PRN
  Filled 2016-02-09: qty 2

## 2016-02-09 MED ORDER — BUTORPHANOL TARTRATE 1 MG/ML IJ SOLN
INTRAMUSCULAR | Status: AC
  Administered 2016-02-09: 1 mg
  Filled 2016-02-09: qty 1

## 2016-02-09 MED ORDER — BUTORPHANOL TARTRATE 1 MG/ML IJ SOLN
1.0000 mg | INTRAMUSCULAR | Status: AC
  Administered 2016-02-09: 1 mg via INTRAVENOUS

## 2016-02-09 MED ORDER — OXYCODONE-ACETAMINOPHEN 5-325 MG PO TABS
2.0000 | ORAL_TABLET | Freq: Once | ORAL | Status: AC
  Administered 2016-02-09: 2 via ORAL
  Filled 2016-02-09: qty 2

## 2016-02-09 MED ORDER — PRENATAL MULTIVITAMIN CH: 1.0000 | ORAL_TABLET | Freq: Every day | ORAL | Status: DC

## 2016-02-09 MED ORDER — DIBUCAINE 1 % RE OINT: 1.0000 "application " | TOPICAL_OINTMENT | RECTAL | Status: DC | PRN

## 2016-02-09 MED ORDER — BUTORPHANOL TARTRATE 1 MG/ML IJ SOLN
INTRAMUSCULAR | Status: AC
Start: 2016-02-09 — End: 2016-02-09
  Filled 2016-02-09: qty 1

## 2016-02-09 MED ORDER — NIFEDIPINE 10 MG PO CAPS: 30.0000 mg | ORAL_CAPSULE | ORAL | Status: DC

## 2016-02-09 MED ORDER — OXYTOCIN 10 UNIT/ML IJ SOLN
INTRAVENOUS | Status: AC
  Filled 2016-02-09: qty 4

## 2016-02-09 MED ORDER — CITRIC ACID-SODIUM CITRATE 334-500 MG/5ML PO SOLN
ORAL | Status: AC
  Administered 2016-02-09: 30 mL
  Filled 2016-02-09: qty 15

## 2016-02-09 MED ORDER — DOCUSATE SODIUM 100 MG PO CAPS
100.0000 mg | ORAL_CAPSULE | Freq: Every day | ORAL | Status: DC
  Administered 2016-02-09 – 2016-02-10 (×2): 100 mg via ORAL
  Filled 2016-02-09 (×3): qty 1

## 2016-02-09 MED ORDER — PROMETHAZINE HCL 25 MG/ML IJ SOLN: 12.5000 mg | Freq: Once | INTRAMUSCULAR | Status: DC

## 2016-02-09 MED ORDER — MEPERIDINE HCL 25 MG/ML IJ SOLN: 6.2500 mg | INTRAMUSCULAR | Status: DC | PRN

## 2016-02-09 MED ORDER — TETANUS-DIPHTH-ACELL PERTUSSIS 5-2.5-18.5 LF-MCG/0.5 IM SUSP
0.5000 mL | Freq: Once | INTRAMUSCULAR | Status: AC
  Administered 2016-02-10: 0.5 mL via INTRAMUSCULAR
  Filled 2016-02-09: qty 0.5

## 2016-02-09 MED ORDER — DIPHENHYDRAMINE HCL 25 MG PO CAPS: 25.0000 mg | ORAL_CAPSULE | Freq: Four times a day (QID) | ORAL | Status: DC | PRN

## 2016-02-09 MED ORDER — BUTORPHANOL TARTRATE 1 MG/ML IJ SOLN
INTRAMUSCULAR | Status: AC
  Filled 2016-02-09: qty 1

## 2016-02-09 MED ORDER — FENTANYL CITRATE (PF) 100 MCG/2ML IJ SOLN: 25.0000 ug | INTRAMUSCULAR | Status: DC | PRN

## 2016-02-09 MED ORDER — PRENATAL PLUS 27-1 MG PO TABS
ORAL_TABLET | Freq: Every day | ORAL | Status: DC
  Administered 2016-02-09: 1 via ORAL
  Filled 2016-02-09: qty 1

## 2016-02-09 MED ORDER — ZOLPIDEM TARTRATE 5 MG PO TABS: 5.0000 mg | ORAL_TABLET | Freq: Every evening | ORAL | Status: DC | PRN

## 2016-02-09 MED ORDER — NIFEDIPINE ER 30 MG PO TB24
30.0000 mg | ORAL_TABLET | Freq: Every day | ORAL | Status: DC
  Administered 2016-02-09: 30 mg via ORAL
  Filled 2016-02-09 (×3): qty 1

## 2016-02-09 MED ORDER — NIFEDIPINE ER 30 MG PO TB24
30.0000 mg | ORAL_TABLET | Freq: Two times a day (BID) | ORAL | Status: DC
  Administered 2016-02-09: 30 mg via ORAL
  Filled 2016-02-09 (×2): qty 1

## 2016-02-09 MED ORDER — NIFEDIPINE 10 MG PO CAPS: 30.0000 mg | ORAL_CAPSULE | Freq: Four times a day (QID) | ORAL | Status: DC

## 2016-02-09 MED ORDER — OXYCODONE-ACETAMINOPHEN 5-325 MG PO TABS: 1.0000 | ORAL_TABLET | ORAL | Status: DC | PRN

## 2016-02-09 MED ORDER — FAMOTIDINE 20 MG PO TABS
20.0000 mg | ORAL_TABLET | Freq: Two times a day (BID) | ORAL | Status: DC
  Administered 2016-02-09 – 2016-02-10 (×3): 20 mg via ORAL
  Filled 2016-02-09 (×3): qty 1

## 2016-02-09 MED ORDER — NIFEDIPINE 10 MG PO CAPS
30.0000 mg | ORAL_CAPSULE | Freq: Once | ORAL | Status: AC
  Administered 2016-02-09: 30 mg via ORAL
  Filled 2016-02-09: qty 3

## 2016-02-09 MED ORDER — LACTATED RINGERS IV BOLUS (SEPSIS)
1000.0000 mL | Freq: Once | INTRAVENOUS | Status: AC
  Administered 2016-02-09: 1000 mL via INTRAVENOUS

## 2016-02-09 NOTE — Progress Notes (Signed)
Dr Erin FullingHarraway-Smith reviewed fetal monitoring strip.

## 2016-02-09 NOTE — Progress Notes (Signed)
Patient ID: Cynthia GuestVeronica Linch, female   DOB: 1996/02/25, 20 y.o.   MRN: [id number] [facility] ANTEPARTUM COMPREHENSIVE PROGRESS NOTE  Cynthia Valenzuela is a 20 y.o. G3P0020 at 5989w5d  who is admitted for Preterm labor, cervical insufficiency.   Fetal presentation is cephalic. Length of Stay:    Days  Subjective: Pt denies ctx on earlier visit.  Recently began to c/o lower abd pain that 'makes her squirm'. Patient reports good fetal movement.  She reports no bleeding and no loss of fluid per vagina.  Vitals:  Blood pressure 118/71, pulse 90, temperature 98.5 F (36.9 C), temperature source Oral, resp. rate 16, height 5\' 5"  (1.651 m), weight 291 lb 9.6 oz (132.269 kg), last menstrual period 08/21/2015, SpO2 98 %, currently breastfeeding. Physical Examination: General appearance - alert, well appearing, and in no distress Cervical Exam: Not evaluated. Membranes:intact  Fetal Monitoring:  Baseline: 150's bpm, Variability: Good {> 6 bpm), Accelerations: Non-reactive but appropriate for gestational age and Decelerations: Variable: moderate  Labs:  Results for orders placed or performed during the hospital encounter of 02/09/16 (from the past 24 hour(s))  CBC   Collection Time: 02/09/16  5:40 AM  Result Value Ref Range   WBC 18.6 (H) 4.0 - 10.5 K/uL   RBC 4.13 3.87 - 5.11 MIL/uL   Hemoglobin 11.7 (L) 12.0 - 15.0 g/dL   HCT 40.935.0 (L) 81.136.0 - 91.446.0 %   MCV 84.7 78.0 - 100.0 fL   MCH 28.3 26.0 - 34.0 pg   MCHC 33.4 30.0 - 36.0 g/dL   RDW 78.214.5 95.611.5 - 21.315.5 %   Platelets 451 (H) 150 - 400 K/uL  Type and screen [facility]   Collection Time: 02/09/16  5:40 AM  Result Value Ref Range   ABO/RH(D) A POS    Antibody Screen NEG    Sample Expiration 02/12/2016   ABO/Rh   Collection Time: 02/09/16  5:40 AM  Result Value Ref Range   ABO/RH(D) A POS   Results for orders placed or performed during the hospital encounter of 02/08/16 (from the past 24 hour(s))  Urinalysis,  Routine w reflex microscopic (not at [facility])   Collection Time: 02/08/16  7:39 PM  Result Value Ref Range   Color, Urine YELLOW YELLOW   APPearance HAZY (A) CLEAR   Specific Gravity, Urine 1.015 1.005 - 1.030   pH 6.5 5.0 - 8.0   Glucose, UA NEGATIVE NEGATIVE mg/dL   Hgb urine dipstick TRACE (A) NEGATIVE   Bilirubin Urine NEGATIVE NEGATIVE   Ketones, ur NEGATIVE NEGATIVE mg/dL   Protein, ur NEGATIVE NEGATIVE mg/dL   Nitrite NEGATIVE NEGATIVE   Leukocytes, UA LARGE (A) NEGATIVE  Urine microscopic-add on   Collection Time: 02/08/16  7:39 PM  Result Value Ref Range   Squamous Epithelial / LPF 6-30 (A) NONE SEEN   WBC, UA 6-30 0 - 5 WBC/hpf   RBC / HPF 0-5 0 - 5 RBC/hpf   Bacteria, UA MANY (A) NONE SEEN   Urine-Other AMORPHOUS URATES/PHOSPHATES     Imaging Studies:    Cervix dilated to ~2cm. Funneling noted. (official report rending. Spoke with Dr. Sherrie Georgeecker of MFM)    Medications:  Scheduled . docusate sodium  100 mg Oral Daily  . famotidine  20 mg Oral BID  . lactated ringers  1,000 mL Intravenous Once  . NIFEdipine  30 mg Oral STAT  . NIFEdipine  30 mg Oral Daily  . NIFEdipine  30 mg Oral BID  . prenatal vitamin w/FE,  FA   Oral Daily  . progesterone  200 mg Vaginal QHS   I have reviewed the patient's current medications.  ASSESSMENT: Patient Active Problem List   Diagnosis Date Noted  . Preterm contractions 02/09/2016  . Incompetent cervix 01/25/2016  . Cervical incompetence during pregnancy in second trimester 01/24/2016  . Cervical shortening affecting pregnancy in second trimester, antepartum 01/20/2016  . UTI (urinary tract infection) in pregnancy in second trimester 12/24/2015  . Bleeding in early pregnancy 12/08/2015  . Frequent headaches 12/08/2015  . Supervision of high risk pregnancy in second trimester 10/11/2015  . Morbid obesity (HCC) 10/11/2015    PLAN: Magnesium sulfate for neuro protection Bolus of 1L IVF- watch for s/sx of fluid overload Increase  Procardia to  XL bid Dose of procardia  x1 now. Keep Prometrium NICU consult  I had a lengthy conversation with the pt and the FOB regarding the cervical changes and the probability of delivery at any time.  I discussed Magnesium for neuro protection. We discussed the risks of prematurity and the plan to try to maintain the pregnancy as long as possible. They both expressed understanding.    Continue routine antenatal care.   HARRAWAY-SMITH, [place] 02/09/2016,11:57 AM

## 2016-02-09 NOTE — Progress Notes (Signed)
Baby delivering breech en caul, Dr. Alvester MorinNewton at bedside

## 2016-02-09 NOTE — Lactation Note (Signed)
This note was copied from a baby's chart. Lactation Consultation Note  Initial visit made.  Baby was born at 23.5 weeks and 2 hours of age.  Mom states she is interested in providing breast milk for her NICU baby.  Discussed benefits for her baby.  Symphony pump set up and initiated.  Instructed on hand expression but no colostrum visible.  Instructed to pump every 3 hours for 15 minutes followed by hand expression.  Discussed colostrum and milk coming to volume.  [facility]  [facility] [place] [facility]  Patient Name: Cynthia Valenzuela Reason for consult: Initial assessment;NICU baby   Maternal Data    Feeding    LATCH Score/Interventions                      Lactation Tools Discussed/Used WIC Program: Yes Pump Review: Setup, frequency, and cleaning;Milk Storage Initiated by:: LC Date initiated:: 02/09/16   Consult Status Consult Status: Follow-up Date: 02/10/16    Huston FoleyMOULDEN, Shaterica Mcclatchy S Valenzuela, 4:36 PM

## 2016-02-09 NOTE — H&P (Signed)
Cynthia Valenzuela is a 20 y.o. female presenting for contractions. History  Cynthia Valenzuela is Cynthia Valenzuela [id number] at 23+5 who presented to the MAU this morning with ongoing painful contractions.  She had a cerclage placed approx. 2 weeks ago for short cervix.  Earlier this week, she began having contractions intermittently.  She was evaluated in the MAU over the weekend and thought to not be in labor.  She returned to the MAU last night with persistent contractions and was given nifedipine, which she did not fill.  She returns this morning complaining of increasing pain with contractions, though they have spaced out since her last visit.  She denies dysuria/hematuria, nausea/vomiting, diarrhea, or new vaginal discharge (she does endorse discharge in the mornings which seems to happen due to her vaginal progesterone).  She denies vaginal bleeding, LOF, or decreased FM.  In the MAU, she was given a dose of nifedipine and Percocet for pain.  After an hour, her pain was not improved with intermittent painful contractions.  Clinic  Low Risk  >>>  Now High Risk due to Short Cx Prenatal Labs  Dating  [redacted]w[redacted]d [place] Blood type: --/--/A POS (07/20 1750)   Genetic Screen 1 Screen: ordered  CRL [redacted]w[redacted]d on 2/15, unable to see NT, offer Quad     Quad:  Negative    Antibody: Neg  Anatomic [place]  Incomplete, short cervix at 1.3cm with funneling, started on Prometrium Rubella:   Immune  GTT Early: 122   Third trimester:  RPR:   NR  Flu vaccine  Declined HBsAg:   Neg  TDaP vaccine  recommended                          HIV: Non Reactive (12/23 2139)   Baby Food    Thinking about it                                  GBS:  Contraception    Unsure Pap:N/A  Circumcision   Yes  Normal Hemoglobinopathy testing  Pediatrician    Support Person  Cynthia Valenzuela (partner)     OB History    Gravida Para Term Preterm AB TAB SAB Ectopic Multiple Living   3 0 0 0 0       Past Medical History  Diagnosis Date  . Obesities, morbid (HCC)   .  Headache    Past Surgical History  Procedure Laterality Date  . No past surgeries    . Wisdom tooth extraction     Family History: family history includes Alcohol abuse in her father. Social History:  reports that she quit smoking about 4 months ago. She has never used smokeless tobacco. She reports that she does not drink alcohol or use illicit drugs.   Prenatal Transfer Tool  Maternal Diabetes: No Genetic Screening: Normal Maternal Ultrasounds/Referrals: short cervix Fetal Ultrasounds or other Referrals:  None Maternal Substance Abuse:  No Significant Maternal Medications:  Meds include: Progesterone Significant Maternal Lab Results:  None Other Comments:  None  ROS No fevers/chills No headaches/blurry vision  No runny nose/sore throat No chest pain/SOB No nausea/vomiting No diarrhea No dysuria/hematuria No rash   Blood pressure 116/43, pulse 85, temperature 98.4 F (36.9 C), temperature source Oral, resp. rate 18, height  (1.651 m), weight 291 lb 9.6 oz (132.269 kg), last menstrual period 08/21/2015, SpO2 99 %, currently breastfeeding. Exam  Physical Exam  Gen: alert, in NAD HEENT: NCAT, normal conjunctivae, moist oral mucosa Chest: normal WOB, lungs CTAB CV: normal rate and regular rhythm, normal S1 and S2, no m/r/g Abd: nontender Ext: no pedal edema Skin: no rashes or lesions noted Psych: cooperative, appropriate affect  GU: deferred repeat SVE at this time FHTs: reactive NST, rare variables No contractions noted on toco, but patient having intermittent tightening and pain  Prenatal labs: ABO, Rh: --/--/A POS (05/04 0540) Antibody: NEG (05/04 0540) Rubella: 1.86 (01/31 0950) RPR: NON REAC (01/31 0950)  HBsAg: NEGATIVE (01/31 0950)  HIV: NONREACTIVE (01/31 0950)  GBS: not yet checked  Assessment/Plan: Cynthia Valenzuela is a 19yo G3P0020 at 23+5 who presents for persistent contractions in the setting of a cerclage for short cervix.  -- Will admit for  observation with continuous tocometer. -- IVF -- Continue nifedipine for tocolysis -- Continue home vaginal progesterone -- US for cervical length this morning -- NSTs qShift -- f/u UCx ordered last night -- Patient received BMZ earlier this week  Caesar ChestnutKelly E Garcia 02/09/2016, 7:54 AM    CNM attestation:  I have seen and examined this patient; I agree with above documentation in the resident's note.   [name] is a 20 y.o. [phone] here for observation due to discomfort with contractions.  PE: BP 115/65 mmHg  Pulse 88  Temp(Src) 98.2 F (36.8 C) (Oral)  Resp 20  Ht 5\' 5"  (1.651 m)  Wt 132.269 kg (291 lb 9.6 oz)  BMI 48.52 kg/m2  SpO2 100%  LMP 08/21/2015 (Exact Date)  Breastfeeding? Yes Gen: calm comfortable, NAD Resp: normal effort, no distress Abd: gravid  ROS, labs, PMH reviewed  Plan: Admit to Antenatal Procardia 30XL qd U/S for cx length  Cynthia Valenzuela 02/10/2016, 12:57 PM

## 2016-02-09 NOTE — Progress Notes (Signed)
Patient feeling pressure in her vagina

## 2016-02-09 NOTE — MAU Note (Signed)
Pt here earlier-states contractions and cramping much worse. Was prescribed Procardia, but did not get prescription filled. Denies vag bleeding.

## 2016-02-10 ENCOUNTER — Encounter: Payer: Self-pay | Admitting: *Deleted

## 2016-02-10 ENCOUNTER — Ambulatory Visit (HOSPITAL_COMMUNITY)
Admission: RE | Admit: 2016-02-10 | Payer: Medicaid Other | Source: Ambulatory Visit | Attending: Family | Admitting: Family

## 2016-02-10 LAB — RPR: RPR: NONREACTIVE

## 2016-02-10 LAB — CULTURE, OB URINE: Culture: 3000 — AB

## 2016-02-10 MED ORDER — IBUPROFEN 600 MG PO TABS: 600.0000 mg | ORAL_TABLET | Freq: Four times a day (QID) | ORAL | Status: DC

## 2016-02-10 MED ORDER — NORETHINDRONE 0.35 MG PO TABS: 1.0000 | ORAL_TABLET | Freq: Every day | ORAL | Status: DC

## 2016-02-10 NOTE — Lactation Note (Deleted)
This note was copied from a baby's chart. Lactation Consultation Note  Patient Name: Cynthia [name] [id number]'s Date: 02/10/2016 Reason for consult: Follow-up assessment   Gave mom pump rental paperwork and showed her how to hand express. She was able to return demonstration and large gtts colostrum noted. Mom has my phone # to call when she is ready for pump rental.   Maternal Data Formula Feeding for Exclusion: No Has patient been taught Hand Expression?: Yes (Per mom) Does the patient have breastfeeding experience prior to this delivery?: No  Feeding    LATCH Score/Interventions                      Lactation Tools Discussed/Used WIC Program: Yes [facility]([place]. Has appt 02/15/16) Pump Review: Setup, frequency, and cleaning;Milk Storage   Consult Status Consult Status: PRN Follow-up type: Call as needed    Ed BlalockSharon S Flornce Record 02/10/2016, 11:44 AM

## 2016-02-10 NOTE — Progress Notes (Signed)
Pt  Ambulated out   Teaching complete    

## 2016-02-10 NOTE — Discharge Instructions (Signed)

## 2016-02-10 NOTE — Discharge Summary (Signed)
OB Discharge Summary     Patient Name: Cynthia Valenzuela DOB: 12/24/1995 MRN: [id number]  Date of admission: 02/09/2016 Delivering MD: Lyndel Safe NILES   Date of discharge: 02/10/2016  Admitting diagnosis: 23 WEEKS CTX WORSE THEN EARLIER Intrauterine pregnancy: [redacted]w[redacted]d     Secondary diagnosis:  Principal Problem:   NSVD (normal spontaneous vaginal delivery) Active Problems:   Supervision of high risk pregnancy in second trimester   Morbid obesity (HCC)   UTI (urinary tract infection) in pregnancy in second trimester   Cervical shortening affecting pregnancy in second trimester, antepartum   Cervical incompetence during pregnancy in second trimester   Preterm contractions   Preterm delivery, delivered   Preterm labor  Additional problems:as above    Discharge diagnosis: Preterm Pregnancy Delivered                                                                                                Post partum procedures:none  Complications: None  Hospital course: pt was admitted for eval of shortened cervix.  She had progressive shortening despite Procardia and prometrium.  Pt was started on Magnesium sulfate for neuroprotection and was s/p NICU consult.  She progressed despite the above and terb and delivered preterm infant over intact perineum.  An attempt was made to removed the cerclage post delivery without success.        Physical exam  Filed Vitals:   02/09/16 1816 02/09/16 1942 02/10/16 0333 02/10/16 0542  BP: 123/63 103/61 120/83 115/65  Pulse: 103 111 107 88  Temp: 98.3 F (36.8 C) 97.7 F (36.5 C) 98.1 F (36.7 C) 98.2 F (36.8 C)  TempSrc: Oral Oral Oral Oral  Resp: Height:      Weight:      SpO2: 100% 100% 99% 100%   General: alert Lochia: appropriate Uterine Fundus: firm Incision: N/A DVT Evaluation: No evidence of DVT seen on physical exam. Labs: Lab Results  Component Value Date   WBC 18.6* 02/09/2016   HGB 11.7* 02/09/2016   HCT  35.0* 02/09/2016   MCV 84.7 02/09/2016   PLT 451* 02/09/2016   CMP Latest Ref Rng 04/27/2015  Glucose 65 - 99 mg/dL 409(W)  BUN 6 - 20 mg/dL <1(X)  Creatinine 9.14 - 1.00 mg/dL 7.82  Sodium 956 - 213 mmol/L 137  Potassium 3.5 - 5.1 mmol/L 3.1(L)  Chloride 101 - 111 mmol/L 106  CO2 22 - 32 mmol/L 23  Calcium 8.9 - 10.3 mg/dL 0.8(M)    Discharge instruction: per After Visit Summary and "Baby and Me Booklet".  After visit meds:    Medication List    STOP taking these medications        acetaminophen 325 MG tablet  Commonly known as:  TYLENOL     NIFEdipine 10 MG capsule  Commonly known as:  PROCARDIA     progesterone 200 MG capsule  Commonly known as:  PROMETRIUM     ranitidine 75 MG tablet  Commonly known as:  ZANTAC      TAKE these medications        famotidine  20 MG tablet  Commonly known as:  PEPCID  Take 1 tablet (20 mg total) by mouth 2 (two) times daily.     ibuprofen 600 MG tablet  Commonly known as:  ADVIL,MOTRIN  Take 1 tablet (600 mg total) by mouth every 6 (six) hours.     norethindrone 0.35 MG tablet  Commonly known as:  ORTHO MICRONOR  Take 1 tablet (0.35 mg total) by mouth daily.     PRENATAL COMPLETE 14-0.4 MG Tabs  Take 2 tablets by mouth daily.        Diet: routine diet  Activity: Advance as tolerated. Pelvic rest for 6 weeks.   Outpatient follow up:4 weeks.  NEEDS CERCLAGE REMOVED AT THIS TIME Follow up Appt:Future Appointments Date Time Provider Department Center  02/16/2016 8:00 AM [facility] US 1 [facility]  02/16/2016 9:05 AM Levie HeritageJacob J Stinson, DO [facility]   Follow up Visit:No Follow-up on file.  Postpartum contraception: Progesterone only pills  Newborn Data: Live born female  Birth Weight: 1 lb 6.6 oz (640 g) APGAR: 2, 5  Baby Feeding: Breast Disposition:NICU   02/10/2016 Willodean RosenthalHARRAWAY-SMITH, Kermitt Harjo, MD

## 2016-02-10 NOTE — Lactation Note (Signed)
This note was copied from a baby's chart. Lactation Consultation Note  Patient Name: Cynthia Valenzuela AVWUJ'WToday's Date: 02/10/2016 Reason for consult: Follow-up assessment;NICU baby;Infant < 6lbs   Follow up with mom of 23 week 5 day NICU infant. Mom and dad feel like infant is doing well.  Mom reports she is pumping some and getting a few gtts. Mom has not pumped this morning. Enc mom to pump every 2-3 hours for 15 minutes with DEBP on Initiate setting, followed by hand expression. [facility] and has [facility] for [facility]/15/16. WIC Loaner pump discussed and mom voiced she would like to rent one. Paperwork left at bedside with my # for mom to call when she is ready for a pump. Mom voiced understanding.  Reviewed Engorgement treatment/prevention. Reviewed EBM Storage and pumping schedule with mom. Mom has LC phone # and is aware of OP/IP Services. Enc mom to call with questions/concerns prn.    Maternal Data Formula Feeding for Exclusion: No Has patient been taught Hand Expression?: Yes (Per mom) Does the patient have breastfeeding experience prior to this delivery?: No  Feeding    LATCH Score/Interventions                      Lactation Tools Discussed/Used WIC Program: Yes [facility]([place]. Has appt 02/15/16) Pump Review: Setup, frequency, and cleaning;Milk Storage   Consult Status Consult Status: PRN Follow-up type: Call as needed    Ed BlalockSharon S Eli Pattillo 02/10/2016, 8:57 AM

## 2016-02-10 NOTE — Consult Note (Signed)
Late entry - Neonatology prenatal consultation done 02/09/16 @ 1200  Asked by Dr.Harrawau-Smith to provide prenatal consultation for 20 y.o. G3 P0 mother who is now 23.[redacted] weeks EGA, with pregnancy complicated by incompetent cervix and preterm labor. She had presented in labor with membranes intact but funneling through cervix and cerclage.  Because of mother's contractions  i was only able to speak with her briefly, but I talked extensively with FOB, explaining usual expectations, concerns, and procedures for preterm infant at 3723 - [redacted] weeks gestation, including possible needs for DR resuscitation, respiratory support, IV access, and blood products.  Also presented risks of death or serious morbidity. Projected possible length of stay in NICU until 36 - [redacted] wks EGA.  Discussed advantages of feeding with mother's milk and parental participation in care. He was attentive, had appropriate questions, and was appreciative of my input.  Thank you for consulting Neonatology.  Total time 20 minutes  Jerek Meulemans E. Barrie DunkerWimmer, Jr., MD Neonatologist

## 2016-02-16 ENCOUNTER — Ambulatory Visit (HOSPITAL_COMMUNITY): Payer: Medicaid Other

## 2016-02-16 ENCOUNTER — Encounter: Payer: Self-pay | Admitting: Family Medicine

## 2016-02-17 ENCOUNTER — Ambulatory Visit (HOSPITAL_COMMUNITY): Payer: Self-pay

## 2016-03-12 ENCOUNTER — Ambulatory Visit (INDEPENDENT_AMBULATORY_CARE_PROVIDER_SITE_OTHER): Payer: Medicaid Other | Admitting: Family Medicine

## 2016-03-12 ENCOUNTER — Encounter: Payer: Self-pay | Admitting: Family Medicine

## 2016-03-12 VITALS — BP 125/82 | HR 82 | Wt 297.4 lb

## 2016-03-12 DIAGNOSIS — Z3202 Encounter for pregnancy test, result negative: Secondary | ICD-10-CM | POA: Diagnosis not present

## 2016-03-12 DIAGNOSIS — Z30430 Encounter for insertion of intrauterine contraceptive device: Secondary | ICD-10-CM | POA: Diagnosis not present

## 2016-03-12 LAB — POCT PREGNANCY, URINE: PREG TEST UR: NEGATIVE

## 2016-03-12 MED ORDER — LEVONORGESTREL 18.6 MCG/DAY IU IUD
INTRAUTERINE_SYSTEM | Freq: Once | INTRAUTERINE | Status: AC
  Administered 2016-03-12: 14:00:00 via INTRAUTERINE

## 2016-03-12 NOTE — Progress Notes (Signed)
Subjective:     Cynthia Valenzuela is a 20 y.o. female who presents for a postpartum visit. She is 4 weeks postpartum following a spontaneous vaginal delivery. I have fully reviewed the prenatal and intrapartum course. The delivery was at 7852w5d gestational weeks. Outcome: spontaneous vaginal delivery. Anesthesia: none. Postpartum course has been normal. Baby died in postpartum course. Bleeding no bleeding. Bowel function is normal. Bladder function is normal. Patient is not sexually active. Contraception method is IUD. Postpartum depression screening: negative.  The following portions of the patient's history were reviewed and updated as appropriate: allergies, current medications, past family history, past medical history, past social history, past surgical history and problem list.  Review of Systems Pertinent items are noted in HPI.   Objective:    BP 125/82 mmHg  Pulse 82  Wt 297 lb 6.4 oz (134.9 kg)  Breastfeeding? No  General:  alert, cooperative and no distress  Lungs: clear to auscultation bilaterally  Heart:  regular rate and rhythm, S1, S2 normal, no murmur, click, rub or gallop  Abdomen: soft, non-tender; bowel sounds normal; no masses,  no organomegaly   Vulva:  normal  Vagina: normal vagina  Cervix:  has healed cervical laceration at 3 o'clock.  Cerclage string seen and removed        Assessment:     normal postpartum exam. Pap smear not done at today's visit.   Plan:    1. Contraception: IUD - placed today 2. Follow up in: 4 weeks or as needed.       IUD Procedure Note Patient identified, informed consent performed, signed copy in chart, time out was performed.  Urine pregnancy test negative.  Speculum placed in the vagina.  Cervix visualized.  Cleaned with Betadine x 2.  Uterus sounded to 11 cm.  Mirena IUD placed per manufacturer's recommendations.  Strings trimmed to 3 cm. Tenaculum was removed, good hemostasis noted.  Patient tolerated procedure well.    Patient given post procedure instructions and Mirena care card with expiration date.  Patient is asked to check IUD strings periodically and follow up in 4-6 weeks for IUD check.

## 2016-03-12 NOTE — Patient Instructions (Signed)

## 2016-03-13 ENCOUNTER — Encounter: Payer: Self-pay | Admitting: Family Medicine

## 2016-03-31 NOTE — MAU Provider Note (Signed)
Chief Complaint:  Abdominal Cramping   First Provider Initiated Contact with Patient 02/08/16 2042      HPI: Cynthia Valenzuela is a 20 y.o. [phone] at 291w5dwho presents to maternity admissions reporting 20-30 sec period cramps that has been going on since yesterday. Pt has hx of 2 fetal losses and one preterm delivery. She now has cerclage in place secondary to incompetent cervix. She reports good fetal movement, denies LOF, vaginal bleeding, vaginal itching/burning, urinary symptoms, h/a, dizziness, n/v, or fever/chills.    HPI  Past Medical History: Past Medical History  Diagnosis Date  . Obesities, morbid (HCC)   . Headache     Past obstetric history: OB History  Gravida Para Term Preterm AB SAB TAB Ectopic Multiple Living  3 1 0 1 2 1 1 0 0 1     # Outcome Date GA Lbr Len/2nd Weight Sex Delivery Anes PTL Lv  3 Preterm 02/09/16 391w5d 01:35 / 00:03 1 lb 6.6 oz (0.64 kg) M Vag-Spont None  Y     Comments: extreme prematurity  2 SAB 04/2015          1 TAB 11/2014              Past Surgical History: Past Surgical History  Procedure Laterality Date  . No past surgeries    . Wisdom tooth extraction      Family History: Family History  Problem Relation Age of Onset  . Alcohol abuse Father     Social History: Social History  Substance Use Topics  . Smoking status: Former Smoker    Quit date: 09/30/2015  . Smokeless tobacco: Never Used  . Alcohol Use: No    Allergies: No Known Allergies  Meds:  No prescriptions prior to admission    ROS:  Review of Systems   I have reviewed patient's Past Medical Hx, Surgical Hx, Family Hx, Social Hx, medications and allergies.   Physical Exam  No data found.  Constitutional: Well-developed, well-nourished female in no acute distress.  Cardiovascular: normal rate Respiratory: normal effort GI: Abd soft, non-tender, gravid appropriate for gestational age.  MS: Extremities nontender, no edema, normal ROM Neurologic:  Alert and oriented x 4.  GU: Neg CVAT.  PELVIC EXAM: Cervix pink, visually closed, without lesion, scant white creamy discharge, vaginal walls and external genitalia normal    FHT:  Baseline reassurring , moderate variability, accelerations present, no decelerations Contractions: irritable type pattern   Labs: No results found for this or any previous visit (from the past 24 hour(s)). --/--/A POS, A POS (05/04 0540)  Imaging:  No results found.  MAU Course/MDM: I have ordered labs and reviewed results.  Consult Dr. Despina HiddenEure regarding POC.  Treatments in MAU includedwet prep, cultures  Pt stable at time of discharge.  Assessment: 1. Preterm contractions, second trimester     Plan: Given pts prior hx of preterm del will give BMX x 2 Discharge home Labor precautions and fetal kick counts Follow-up Information    [facility]   Why:  Keep next scheduled appointment.   Contact information:   [address] [place] [id number] [phone]       Medication List    STOP taking these medications        acetaminophen 325 MG tablet  Commonly known as:  TYLENOL     famotidine 20 MG tablet  Commonly known as:  PEPCID     PRENATAL COMPLETE 14-0.4 MG Tabs  progesterone 200 MG capsule  Commonly known as:  PROMETRIUM     ranitidine 75 MG tablet  Commonly known as:  ZANTAC

## 2017-04-19 ENCOUNTER — Telehealth: Payer: Self-pay | Admitting: Family Medicine

## 2017-04-19 NOTE — Telephone Encounter (Signed)
Cramping with IUD. Pains in lower stomach, and back.

## 2017-05-07 NOTE — Telephone Encounter (Signed)
I called Cynthia Valenzuela and she has had the IUD in for a year and states it was fine until about 3-4 months ago- started having intermittent cramps/ pain up to 7 . States taking tylenol without relief. I advised her to take ibuprofen 400-600 every 6 hours as needed.  I informed her I will have registrars schedule her for an appointment to see any provider and they will call her with an appt. She voices understanding.

## 2017-06-06 ENCOUNTER — Encounter: Payer: Self-pay | Admitting: *Deleted

## 2017-06-06 ENCOUNTER — Ambulatory Visit: Payer: Medicaid Other | Admitting: Obstetrics & Gynecology

## 2017-06-06 NOTE — Progress Notes (Signed)
Pt no showed for appointment with Dr. Macon LargeAnyanwu, discussed patient with provider and she states that if patient would like to reschedule she may call to do so. No need to contact patient.

## 2017-06-09 IMAGING — US US OB TRANSVAGINAL
1 series · 15 of 28 positions shown · non-contrast
Comparison: None.

CLINICAL DATA: Acute onset of vaginal bleeding.  Initial encounter.

EXAM:
OBSTETRIC <14 WK US AND TRANSVAGINAL OB US
TECHNIQUE: Both transabdominal and transvaginal ultrasound examinations were
performed for complete evaluation of the gestation as well as the
maternal uterus, adnexal regions, and pelvic cul-de-sac.
Transvaginal technique was performed to assess early pregnancy.

[Series 1: us ob transvaginal · 73 acquisitions, 15 frames shown]
[im 1/73]
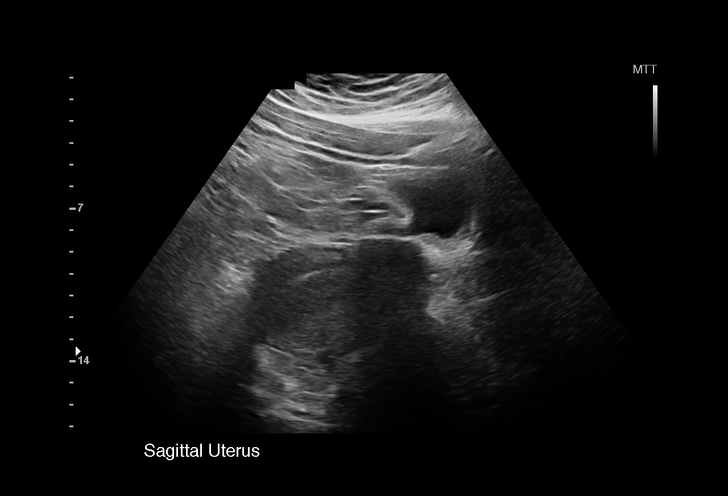
[im 6/73]
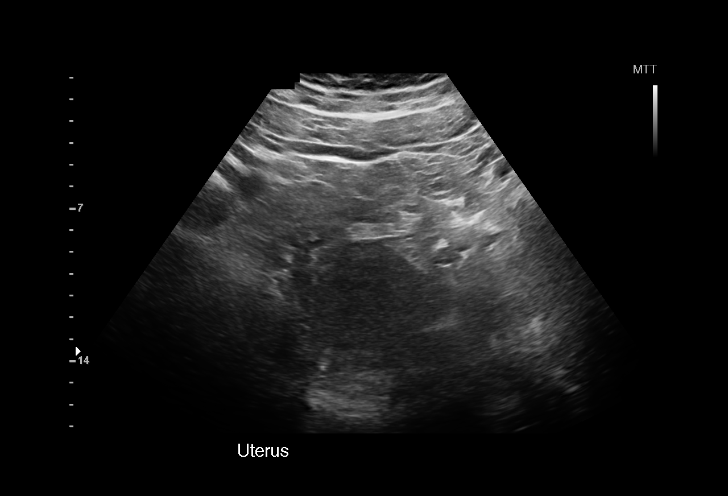
[im 11/73]
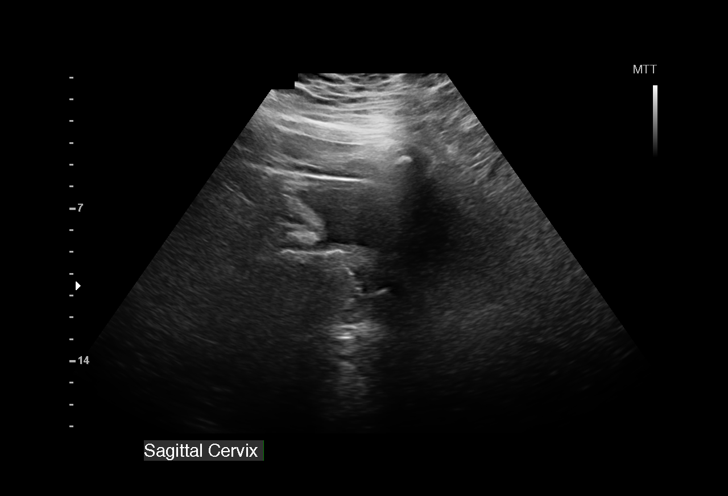
[im 17/73]
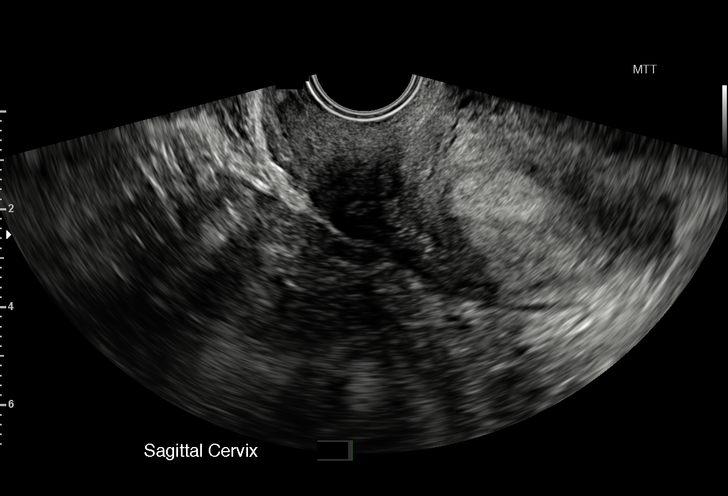
[im 22/73]
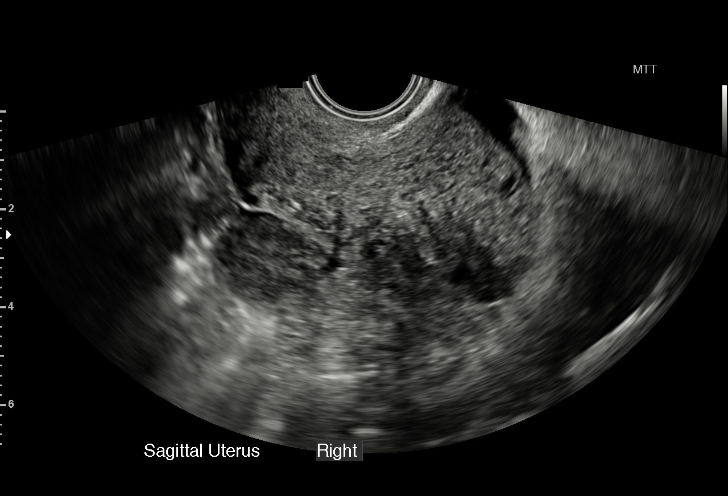
[im 27/73]
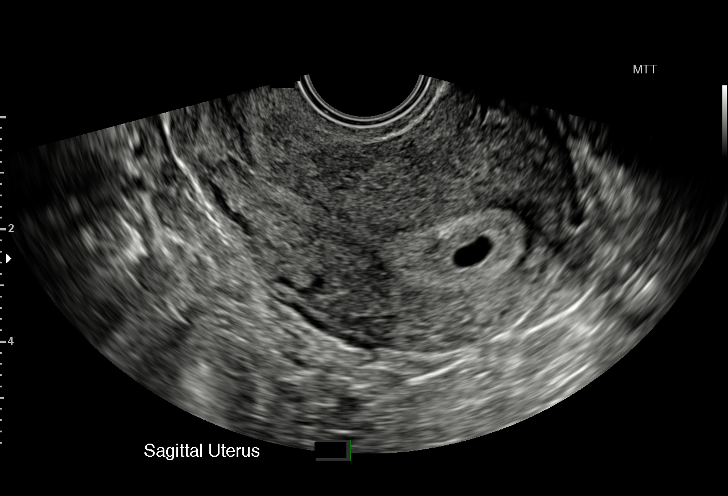
[im 33/73]
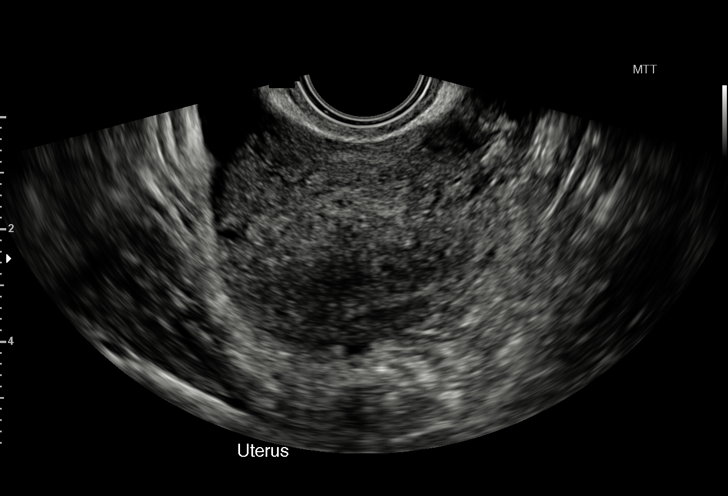
[im 38/73]
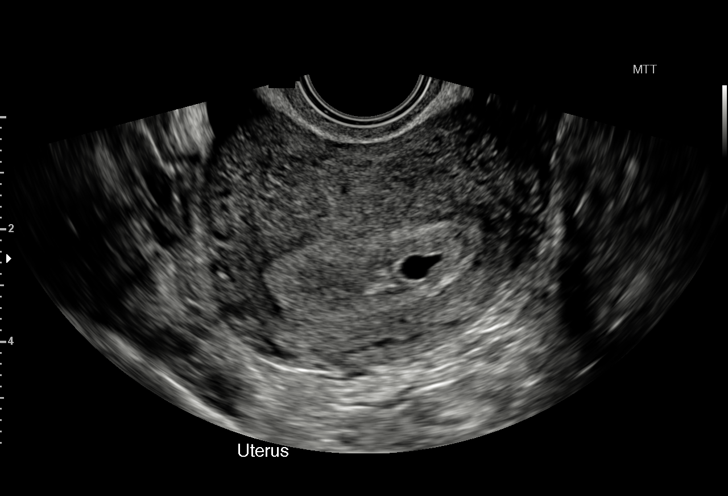
[im 41/73]
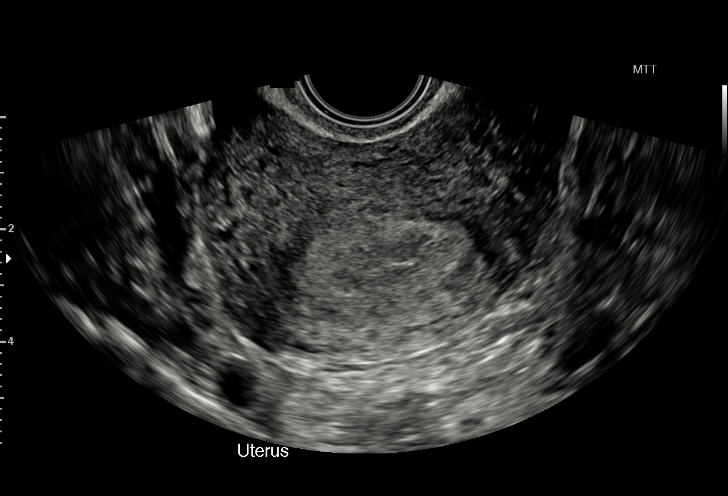
[im 46/73]
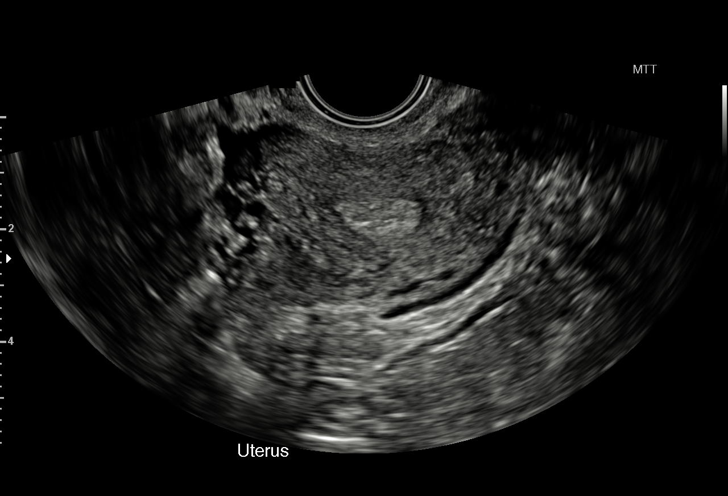
[im 51/73]
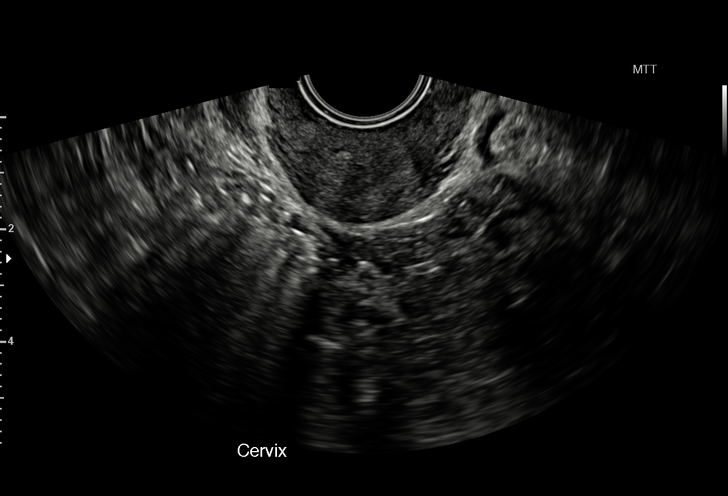
[im 57/73]
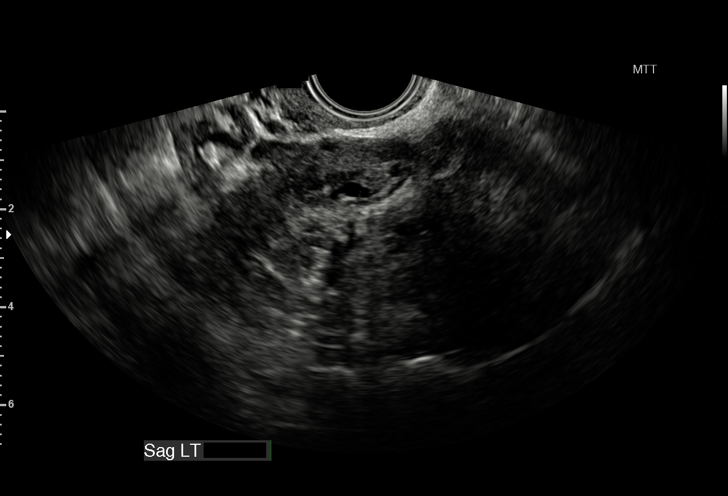
[im 62/73]
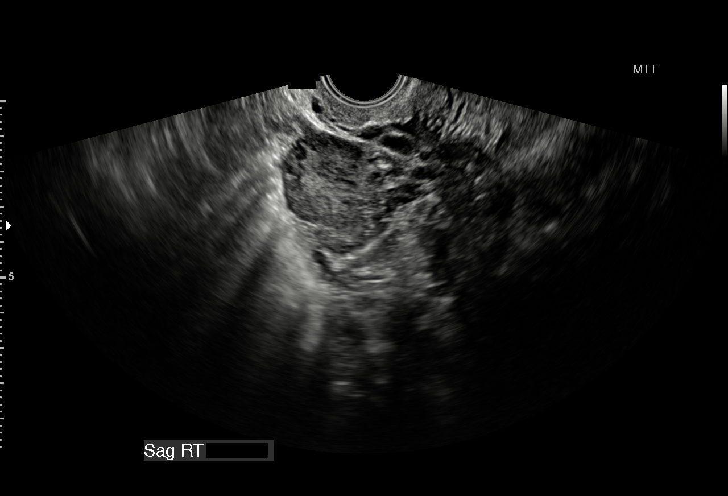
[im 67/73]
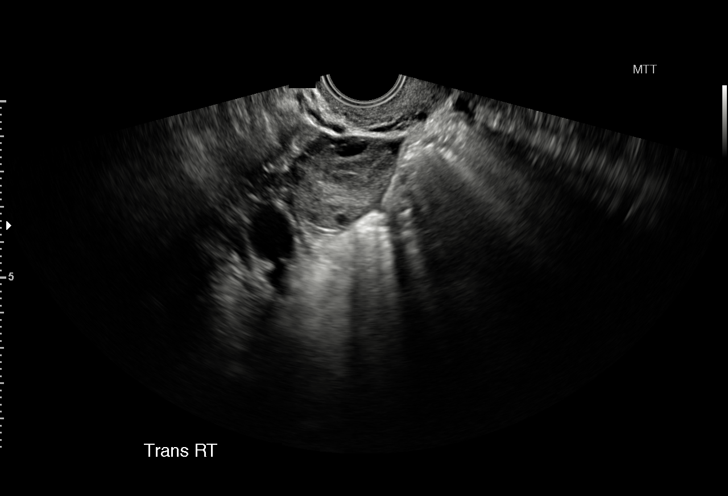
[im 73/73]
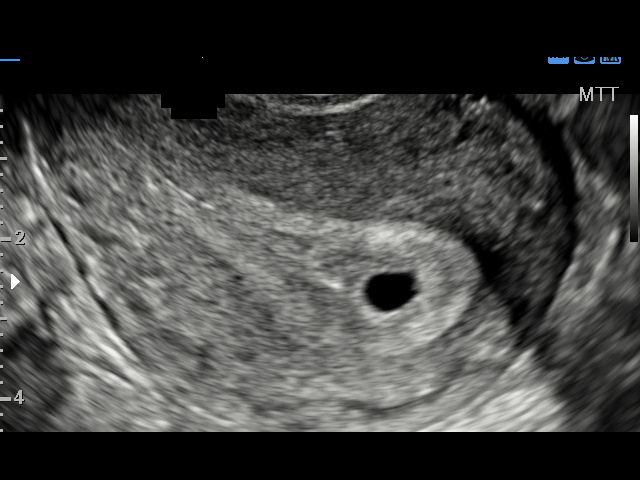

[15 of 28 positions shown; findings below may reference images not displayed]

FINDINGS: Intrauterine gestational sac: Visualized/normal in shape.

Yolk sac:  Yes

Embryo:  No

Cardiac Activity: N/A

MSD: 6.6  mm   5 w   3  d

Subchorionic hemorrhage: A small amount of subchorionic hemorrhage
is noted.

Maternal uterus/adnexae: The uterus is otherwise unremarkable in
appearance.

The ovaries are unremarkable in appearance. The right ovary measures
3.7 x 2.2 x 3.7 cm, while the left ovary measures 3.1 x 1.8 x
cm. No suspicious adnexal masses are seen; there is no evidence for
ovarian torsion.

Trace free fluid is seen within the pelvic cul-de-sac.
IMPRESSION: 1. Single intrauterine gestational sac noted, with a mean sac
diameter of 7 mm, corresponding to a gestational age of 5 weeks 3
days. This matches the gestational age of 5 weeks 5 days by LMP,
reflecting an estimated date of delivery May 27, 2016. A yolk
sac is visualized. The embryo is not yet seen, within normal limits.
2. Small amount of subchorionic hemorrhage noted.

## 2017-07-23 ENCOUNTER — Encounter (HOSPITAL_COMMUNITY): Payer: Self-pay | Admitting: *Deleted

## 2017-07-23 ENCOUNTER — Inpatient Hospital Stay (HOSPITAL_COMMUNITY)
Admission: AD | Admit: 2017-07-23 | Discharge: 2017-07-23 | Disposition: A | Discharge status: Home / Self Care | Payer: Self-pay | Source: Ambulatory Visit | Attending: Family Medicine | Admitting: Family Medicine

## 2017-07-23 ENCOUNTER — Telehealth: Payer: Self-pay | Admitting: General Practice

## 2017-07-23 DIAGNOSIS — R102 Pelvic and perineal pain: Secondary | ICD-10-CM | POA: Diagnosis present

## 2017-07-23 DIAGNOSIS — T8384XA Pain from genitourinary prosthetic devices, implants and grafts, initial encounter: Secondary | ICD-10-CM

## 2017-07-23 DIAGNOSIS — B9689 Other specified bacterial agents as the cause of diseases classified elsewhere: Secondary | ICD-10-CM | POA: Insufficient documentation

## 2017-07-23 DIAGNOSIS — R109 Unspecified abdominal pain: Secondary | ICD-10-CM

## 2017-07-23 DIAGNOSIS — Z3202 Encounter for pregnancy test, result negative: Secondary | ICD-10-CM | POA: Insufficient documentation

## 2017-07-23 DIAGNOSIS — Z30432 Encounter for removal of intrauterine contraceptive device: Secondary | ICD-10-CM | POA: Insufficient documentation

## 2017-07-23 DIAGNOSIS — N760 Acute vaginitis: Secondary | ICD-10-CM | POA: Insufficient documentation

## 2017-07-23 DIAGNOSIS — Z87891 Personal history of nicotine dependence: Secondary | ICD-10-CM | POA: Insufficient documentation

## 2017-07-23 DIAGNOSIS — T839XXS Unspecified complication of genitourinary prosthetic device, implant and graft, sequela: Secondary | ICD-10-CM

## 2017-07-23 LAB — URINALYSIS, ROUTINE W REFLEX MICROSCOPIC
Bilirubin Urine: NEGATIVE
GLUCOSE, UA: NEGATIVE mg/dL
HGB URINE DIPSTICK: NEGATIVE
Ketones, ur: NEGATIVE mg/dL
LEUKOCYTES UA: NEGATIVE
Nitrite: NEGATIVE
PROTEIN: NEGATIVE mg/dL
SPECIFIC GRAVITY, URINE: 1.017 (ref 1.005–1.030)
pH: 5 (ref 5.0–8.0)

## 2017-07-23 LAB — WET PREP, GENITAL
Sperm: NONE SEEN
TRICH WET PREP: NONE SEEN
YEAST WET PREP: NONE SEEN

## 2017-07-23 LAB — POCT PREGNANCY, URINE: Preg Test, Ur: NEGATIVE

## 2017-07-23 MED ORDER — METRONIDAZOLE 500 MG PO TABS: 500.0000 mg | ORAL_TABLET | Freq: Two times a day (BID) | ORAL | 0 refills | Status: AC

## 2017-07-23 NOTE — Telephone Encounter (Signed)
Patient called and left message requesting to schedule an appt to remove her IUD as it is causing bad cramps & pelvic pain. Per chart review, patient is currently in MAU for concern.

## 2017-07-23 NOTE — MAU Provider Note (Signed)
History     CSN: [id number]  Arrival date and time: 07/23/17 1049   First Provider Initiated Contact with Patient 07/23/17 1219      Chief Complaint  Patient presents with  . Abdominal Pain   HPI  Ms. Cynthia Valenzuela is a 21 y.o. [phone] non-pregnant female presenting to MAU with complaints of intermittent abdominal cramping; rated 7/10. She reports having this cramping since the IUD was inserted in 03/2016. She reports one menstrual cycle after insertion; none since then. She is sexually active with 1 partner. She uses condoms. She is here today to have her IUD removed.  Past Medical History:  Diagnosis Date  . Headache   . Obesities, morbid [facility])     Past Surgical History:  Procedure Laterality Date  . WISDOM TOOTH EXTRACTION      Family History  Problem Relation Age of Onset  . Alcohol abuse Father     Social History  Substance Use Topics  . Smoking status: Former Smoker    Quit date: 09/30/2015  . Smokeless tobacco: Never Used  . Alcohol use No    Allergies: No Known Allergies  Prescriptions Prior to Admission  Medication Sig Dispense Refill Last Dose  . ibuprofen (ADVIL,MOTRIN) 200 MG tablet Take 400-600 mg by mouth every 6 (six) hours as needed for cramping.   07/22/2017 at Unknown time    Review of Systems  Constitutional: Negative.   HENT: Negative.   Eyes: Negative.   Respiratory: Negative.   Cardiovascular: Negative.   Gastrointestinal: Positive for abdominal pain (lower over bladder).  Endocrine: Negative.   Genitourinary: Positive for pelvic pain (lower ). Negative for vaginal bleeding.  Musculoskeletal: Negative.   Skin: Negative.    Physical Exam   Blood pressure 120/68, pulse 97, temperature 98 F (36.7 C), temperature source Oral, resp. rate 18, weight (!) 148.1 kg (326 lb 8 oz), SpO2 99 %, not currently breastfeeding.  Physical Exam  Constitutional: She is oriented to person, place, and time. Vital signs are normal. She appears  well-developed and well-nourished.  HENT:  Head: Normocephalic.  Eyes: Pupils are equal, round, and reactive to light.  Neck: Normal range of motion.  Cardiovascular: Normal rate, regular rhythm and normal heart sounds.   Respiratory: Effort normal and breath sounds normal.  GI: Soft. Bowel sounds are normal.  Genitourinary:    Genitourinary Comments: Uterus: no-tender, cx: smooth, pink, no lesions, strings visualized at 5 o'clock position, small amt of mucousy, white vaginal d/c, closed/long/firm, no CMT or friability, no adnexal tenderness   Musculoskeletal: Normal range of motion.  Neurological: She is alert and oriented to person, place, and time.  Skin: Skin is warm and dry.  Psychiatric: She has a normal mood and affect. Her behavior is normal. Judgment and thought content normal.    MAU Course  Procedures Removal of Mirena IUD -- strings visualized at 5 o'clock position, strings grasped firmly with long straight Kelly clamp, strings and IUD removed intact without difficulty; patient tolerated well. Patient shown intact IUD - patient ok with IUD being discarded in trashcan.  MDM [facility] UPT Wet Prep GC/CT HIV  Results for orders placed or performed during the hospital encounter of 07/23/17 (from the past 24 hour(s))  Urinalysis, Routine w reflex microscopic     Status: None   Collection Time: 07/23/17 11:15 AM  Result Value Ref Range   Color, Urine YELLOW YELLOW   APPearance CLEAR CLEAR   Specific Gravity, Urine 1.017 1.005 - 1.030   pH 5.0  5.0 - 8.0   Glucose, UA NEGATIVE NEGATIVE mg/dL   Hgb urine dipstick NEGATIVE NEGATIVE   Bilirubin Urine NEGATIVE NEGATIVE   Ketones, ur NEGATIVE NEGATIVE mg/dL   Protein, ur NEGATIVE NEGATIVE mg/dL   Nitrite NEGATIVE NEGATIVE   Leukocytes, UA NEGATIVE NEGATIVE  Pregnancy, urine POC     Status: None   Collection Time: 07/23/17 11:41 AM  Result Value Ref Range   Preg Test, Ur NEGATIVE NEGATIVE  Wet prep, genital     Status:  Abnormal   Collection Time: 07/23/17  1:13 PM  Result Value Ref Range   Yeast Wet Prep HPF POC NONE SEEN NONE SEEN   Trich, Wet Prep NONE SEEN NONE SEEN   Clue Cells Wet Prep HPF POC PRESENT (A) NONE SEEN   WBC, Wet Prep HPF POC MANY (A) NONE SEEN   Sperm NONE SEEN     Assessment and Plan  Complication of intrauterine device (IUD), sequela - Advised that fertility returns as soon as IUD removed - Advised to use condoms with every sexual encounter, unless pregnancy is desired - Schedule appt with [facility] to start new form of birth control  Pelvic pain - Advised to take Motrin prn pain - Information provided on pelvic pain  Bacterial vaginitis - Rx for Flagyl 500 mg PO BID x 7 days sent - Information provided on BV & Flagyl  Discharge home Patient verbalized an understanding of the plan of care and agrees.   Raelyn Mora, MSN, CNM 07/23/2017, 12:47 PM

## 2017-07-23 NOTE — MAU Note (Signed)
Having really bad cramping,  Believes it because of her IUD.  Was placed June 2017. Had a period after it was initially placed, but not since. cramping has been really bad, getting worse.

## 2017-07-23 NOTE — MAU Provider Note (Signed)
History   Ms. Cynthia Valenzuela is a 21 year old female, G3P0120, who presents today complaining of severe cramping in her lower abdomen. She states that the cramping began over a year ago after she had the Mirena IUD placed in 03/2016, however has gotten worse in the last 2 days. She describes the cramping as uncomfortable and painful, rating it at a 7/10 constantly. She takes 2-3 Tyleonol 1-2 times per week for the pain. She states that the cramping and pain is worse after sexual intercourse and after a long day of work. The cramping also causes lower back pain that is relieved with leaning forwards.  The patient is currently sexually active with one sexual partner and denies use of condoms or previous history of STI. She describes some vaginal itching that began after using a new soap, however this subsided after she discontinued the new soap 2 weeks ago. Patient denies hematuria, dysuria, discharge, or bleeding. The patient also states that she has not had a period since having the Mirena IUD placed, however she did experience mild spotting 2 months ago.  CSN: [id number]  Arrival date and time: 07/23/17 1049   First Provider Initiated Contact with Patient 07/23/17 1219      Chief Complaint  Patient presents with  . Abdominal Pain   HPI  Pertinent Gynecological History: Menses: Irregular, no menses since placement of IUD in June 2017, mild spotting 2 mos prior Bleeding: None Contraception: IUD DES exposure: unknown Blood transfusions: unknown Sexually transmitted diseases: no past history Previous GYN Procedures: unknown  Last mammogram: N/A  Last pap: unknown   Past Medical History:  Diagnosis Date  . Headache   . Obesities, morbid [facility])     Past Surgical History:  Procedure Laterality Date  . WISDOM TOOTH EXTRACTION      Family History  Problem Relation Age of Onset  . Alcohol abuse Father     Social History  Substance Use Topics  . Smoking status: Former Smoker    Quit  date: 09/30/2015  . Smokeless tobacco: Never Used  . Alcohol use No    Allergies: No Known Allergies  Prescriptions Prior to Admission  Medication Sig Dispense Refill Last Dose  . ibuprofen (ADVIL,MOTRIN) 200 MG tablet Take 400-600 mg by mouth every 6 (six) hours as needed for cramping.   07/22/2017 at Unknown time    Review of Systems  Constitutional: Negative.   HENT: Negative.   Eyes: Negative.   Respiratory: Negative.   Cardiovascular: Negative.   Gastrointestinal: Negative for abdominal pain (lower abdominal and suprapubic).  Endocrine: Negative.   Genitourinary: Positive for dyspareunia, pelvic pain and vaginal pain. Negative for decreased urine volume, difficulty urinating, dysuria, frequency, hematuria, urgency, vaginal bleeding and vaginal discharge.  Musculoskeletal: Positive for back pain (lower back pain).  Skin: Negative.   Allergic/Immunologic: Negative.   Neurological: Negative.   Hematological: Negative.   Psychiatric/Behavioral: Negative.   All other systems reviewed and are negative.  Physical Exam   Blood pressure 138/71, pulse 92, temperature 98 F (36.7 C), temperature source Oral, resp. rate 18, weight (!) 148.1 kg (326 lb 8 oz), SpO2 99 %.  Physical Exam  Constitutional: She is oriented to person, place, and time. She appears well-developed and well-nourished.  HENT:  Head: Normocephalic and atraumatic.  Cardiovascular: Normal rate, regular rhythm and normal heart sounds.   Respiratory: Effort normal and breath sounds normal.  GI: Soft. Bowel sounds are normal. There is tenderness (lower abdomen and suprapubic). There is no guarding.  Genitourinary: Vaginal discharge (mild) found.  Musculoskeletal: Normal range of motion.  Neurological: She is alert and oriented to person, place, and time.  Skin: Skin is warm and dry.  Psychiatric: She has a normal mood and affect. Her behavior is normal. Judgment and thought content normal.      MAU Course   Procedures  - Speculum exam - Bimanual exam - GC culture/wet prep - Removal of IUD  MDM 21 y88ear old female presenting with lower abdominal cramping and pain for the last year after IUD placement that has worsened over the last two days. Physical exam reproduced the right and left lower abdominal, and suprapubic pain and cramping. Speculum exam revealed mild discharge and bimanual exam revealed mild tenderness. GC culture and wet prep performed. Wet prep results showed presence of clue cells indicating bacterial vaginosis.  Assessment and Plan  Bacterial vaginosis - Flagyl 500 mg PO BID x 5 days - Follow-up with GYN clinic for alternate method of birth control - Discussed need to use condoms during sexual intercourse until alternate method of birth control is established or remain abstinent to prevent pregnancy.  Julieanne Manson, PA-S 07/23/2017, 2:42 PM

## 2017-07-23 NOTE — Discharge Instructions (Signed)
REMEMBER YOUR FERTILITY IS NO LONGER SUPPRESSED -- YOU ARE ABLE TO GET PREGNANT STARTING FROM TODAY, IF YOU DO NOT USE ANY BIRTH CONTROL TO PREVENT PREGNANCY. Please use condoms until you have been well established with a different form of birth control.

## 2017-07-24 LAB — GC/CHLAMYDIA PROBE AMP (~~LOC~~) NOT AT ARMC
CHLAMYDIA, DNA PROBE: NEGATIVE
NEISSERIA GONORRHEA: NEGATIVE

## 2017-07-24 LAB — HIV ANTIBODY (ROUTINE TESTING W REFLEX): HIV Screen 4th Generation wRfx: NONREACTIVE

## 2017-08-07 IMAGING — US US OB COMP LESS 14 WK
1 series · 15 of 16 positions shown · non-contrast
Comparison: Pelvic ultrasound performed 11/23/2015

CLINICAL DATA: Acute onset of vaginal bleeding.  Initial encounter.

EXAM:
OBSTETRIC <14 WK ULTRASOUND
TECHNIQUE: Transabdominal ultrasound was performed for evaluation of the
gestation as well as the maternal uterus and adnexal regions.

[Series 1: us ob comp less 14 wk · 16 acquisitions, 15 frames shown]
[im 1/16]
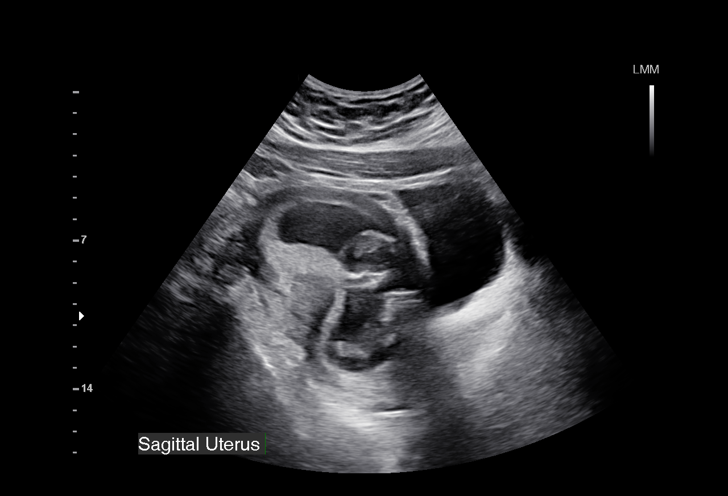
[im 2/16]
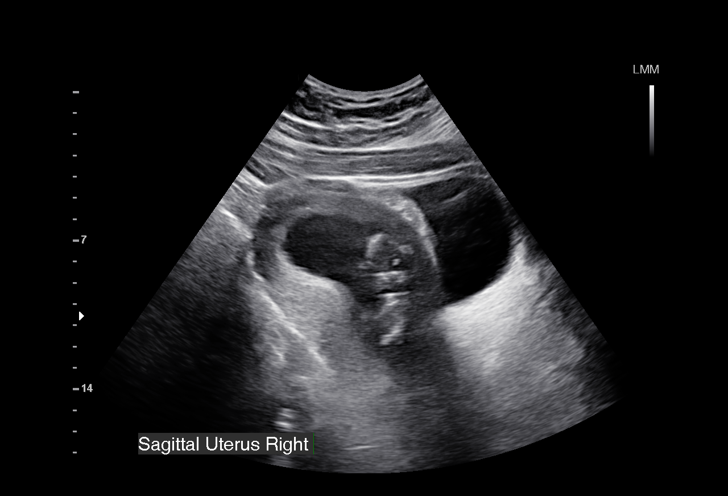
[im 3/16]
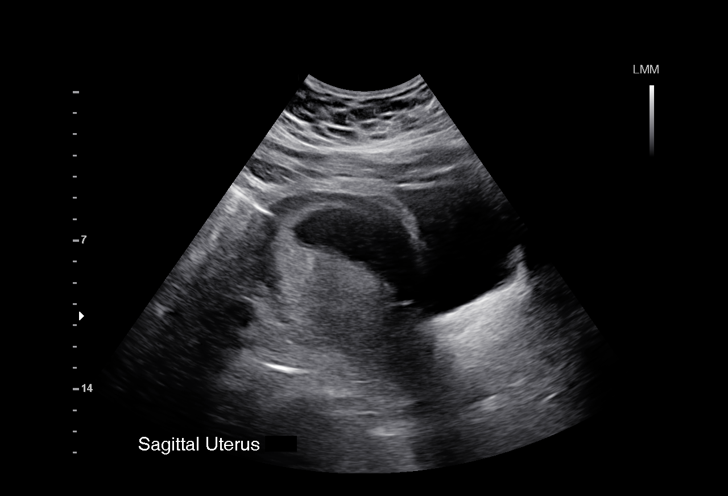
[im 4/16]
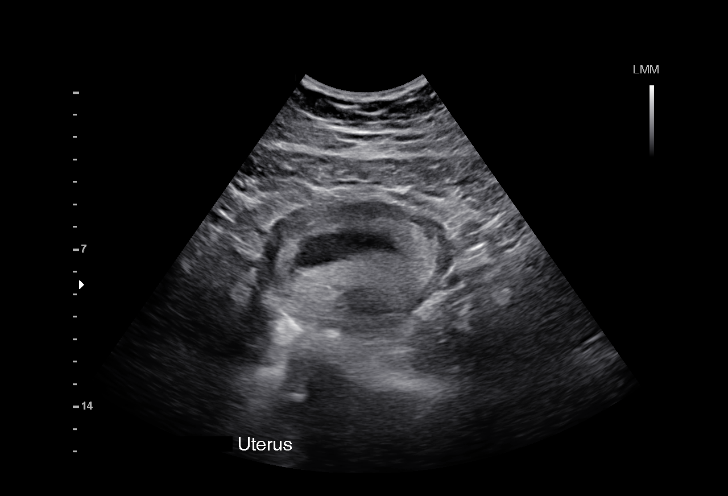
[im 5/16]
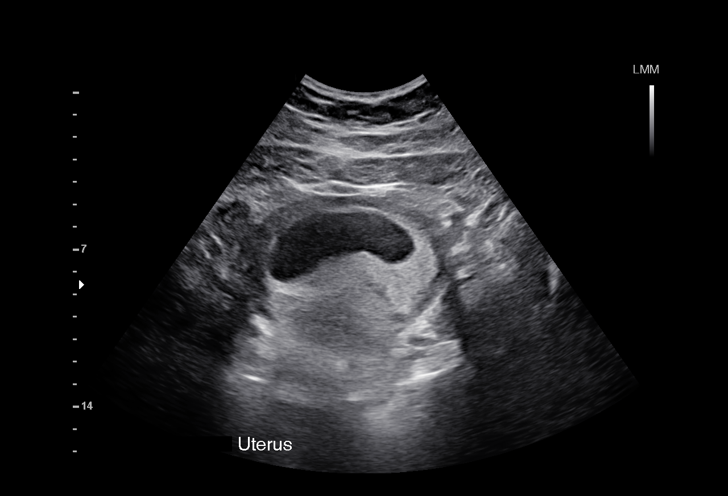
[im 6/16]
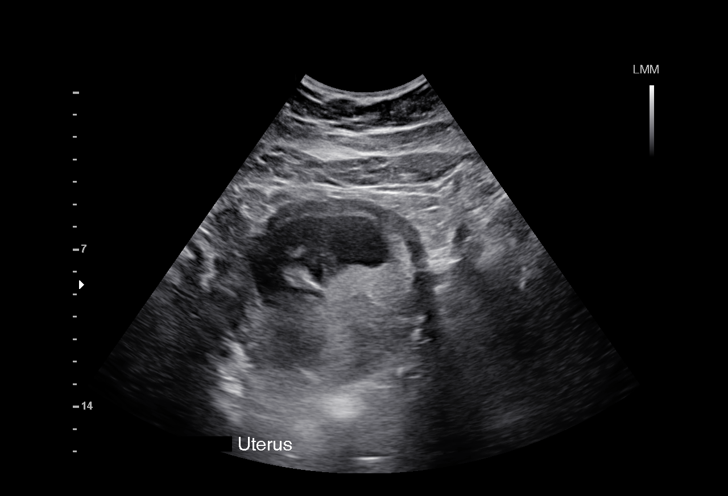
[im 7/16]
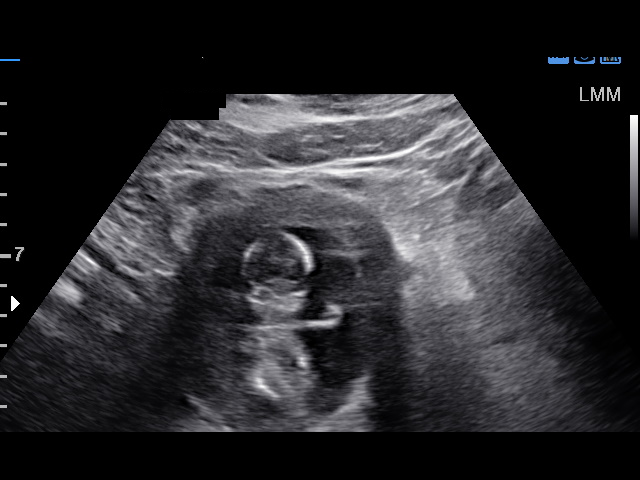
[im 9/16]
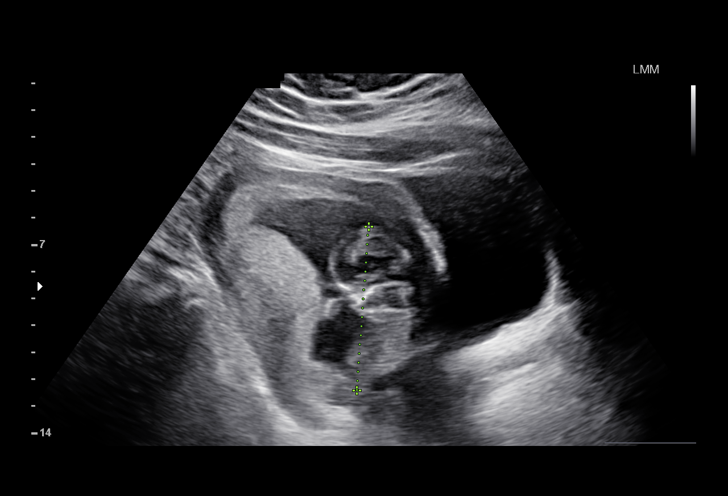
[im 10/16]
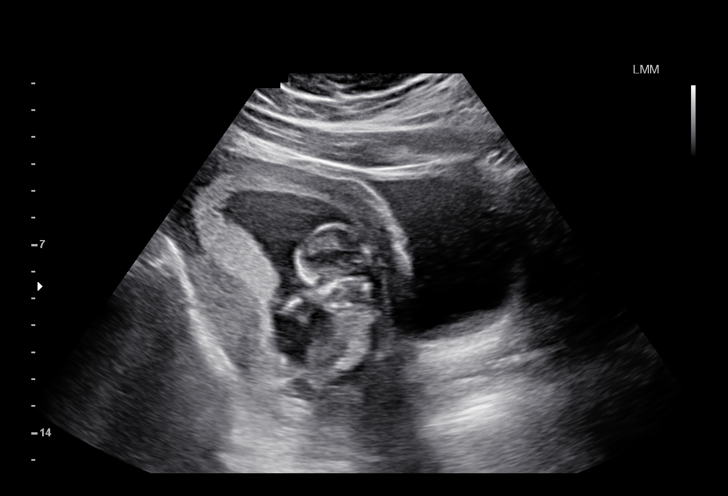
[im 11/16]
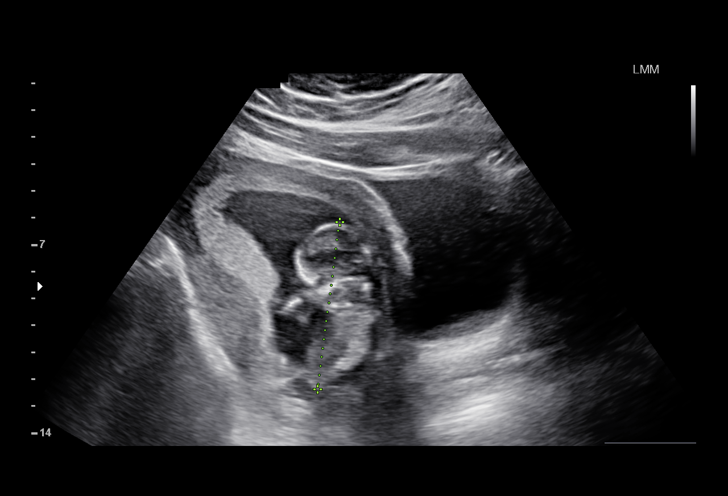
[im 12/16]
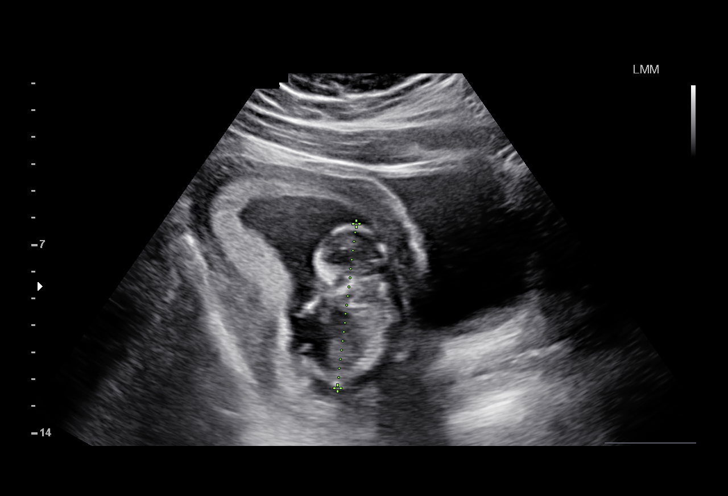
[im 13/16]
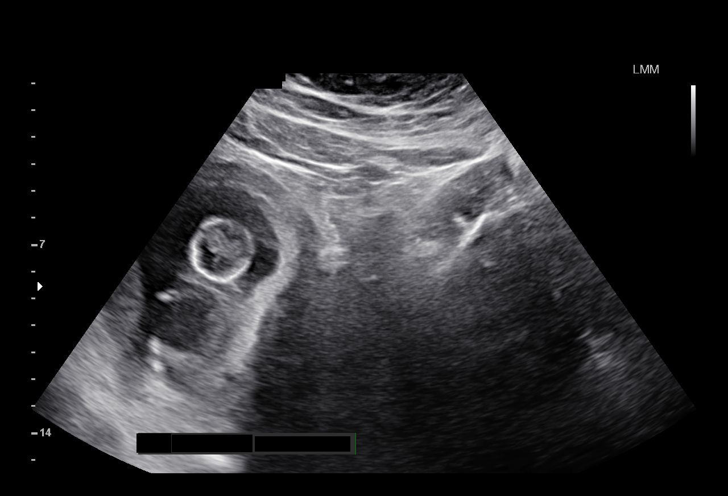
[im 14/16]
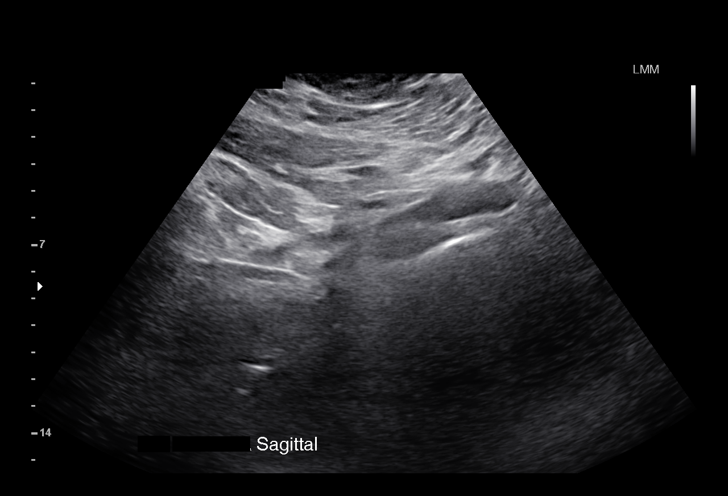
[im 15/16]
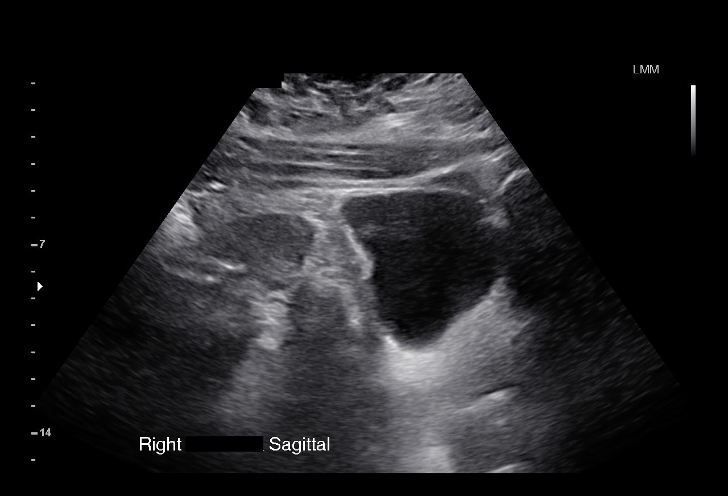
[im 16/16]
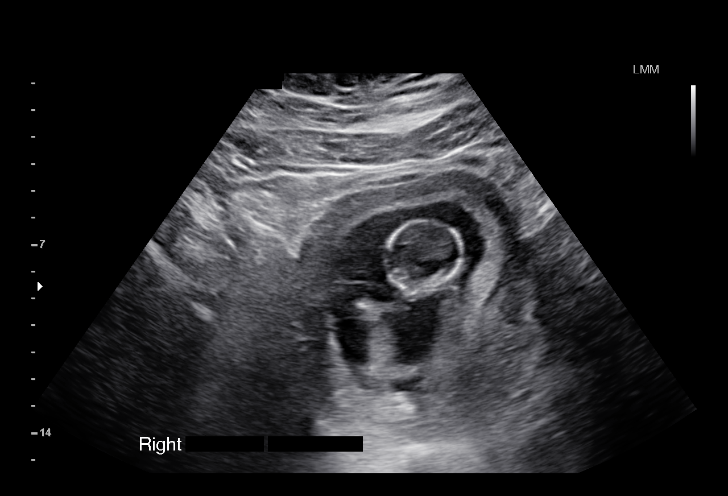

[15 of 16 positions shown; findings below may reference images not displayed]

FINDINGS: Intrauterine gestational sac: Visualized/normal in shape.

Yolk sac:  No

Embryo:  Yes

Cardiac Activity: Yes

Heart Rate: 166 bpm

CRL:   6.2 cm   12 w 4 d                  US EDC: 06/07/2016

Subchorionic hemorrhage:  None visualized.

Maternal uterus/adnexae: The uterus is otherwise unremarkable. The
ovaries are not visualized on this study.

No free fluid is seen within the pelvic cul-de-sac.
IMPRESSION: Single live intrauterine pregnancy noted, with a crown-rump length
of 6.2 cm, corresponding to a gestational age of 12 weeks 4 days.
The gestational age of 13 weeks 2 days by prior ultrasound reflects
an estimated date of delivery June 02, 2016, which is being
used as the clinical estimated date of delivery.

## 2017-12-16 ENCOUNTER — Encounter: Payer: Self-pay | Admitting: *Deleted

## 2017-12-29 ENCOUNTER — Inpatient Hospital Stay (HOSPITAL_COMMUNITY): Payer: Self-pay

## 2017-12-29 ENCOUNTER — Encounter (HOSPITAL_COMMUNITY): Payer: Self-pay

## 2017-12-29 ENCOUNTER — Inpatient Hospital Stay (HOSPITAL_COMMUNITY)
Admission: AD | Admit: 2017-12-29 | Discharge: 2017-12-29 | Disposition: A | Discharge status: Home / Self Care | Payer: Self-pay | Source: Ambulatory Visit | Attending: Obstetrics and Gynecology | Admitting: Obstetrics and Gynecology

## 2017-12-29 DIAGNOSIS — O3680X0 Pregnancy with inconclusive fetal viability, not applicable or unspecified: Secondary | ICD-10-CM

## 2017-12-29 DIAGNOSIS — R1030 Lower abdominal pain, unspecified: Secondary | ICD-10-CM | POA: Insufficient documentation

## 2017-12-29 DIAGNOSIS — R109 Unspecified abdominal pain: Secondary | ICD-10-CM

## 2017-12-29 DIAGNOSIS — O26891 Other specified pregnancy related conditions, first trimester: Secondary | ICD-10-CM

## 2017-12-29 DIAGNOSIS — O26892 Other specified pregnancy related conditions, second trimester: Secondary | ICD-10-CM | POA: Insufficient documentation

## 2017-12-29 DIAGNOSIS — O99211 Obesity complicating pregnancy, first trimester: Secondary | ICD-10-CM | POA: Insufficient documentation

## 2017-12-29 DIAGNOSIS — Z3A01 Less than 8 weeks gestation of pregnancy: Secondary | ICD-10-CM | POA: Insufficient documentation

## 2017-12-29 LAB — URINALYSIS, ROUTINE W REFLEX MICROSCOPIC
Bilirubin Urine: NEGATIVE
Glucose, UA: NEGATIVE mg/dL
Hgb urine dipstick: NEGATIVE
KETONES UR: NEGATIVE mg/dL
Leukocytes, UA: NEGATIVE
Nitrite: NEGATIVE
PH: 5 (ref 5.0–8.0)
Protein, ur: 30 mg/dL — AB
Specific Gravity, Urine: 1.035 — ABNORMAL HIGH (ref 1.005–1.030)

## 2017-12-29 LAB — WET PREP, GENITAL
CLUE CELLS WET PREP: NONE SEEN
Sperm: NONE SEEN
TRICH WET PREP: NONE SEEN
YEAST WET PREP: NONE SEEN

## 2017-12-29 LAB — CBC
HEMATOCRIT: 38 % (ref 36.0–46.0)
Hemoglobin: 12.7 g/dL (ref 12.0–15.0)
MCH: 29 pg (ref 26.0–34.0)
MCHC: 33.4 g/dL (ref 30.0–36.0)
MCV: 86.8 fL (ref 78.0–100.0)
Platelets: 343 10*3/uL (ref 150–400)
RBC: 4.38 MIL/uL (ref 3.87–5.11)
RDW: 14.1 % (ref 11.5–15.5)
WBC: 8.6 10*3/uL (ref 4.0–10.5)

## 2017-12-29 LAB — HCG, QUANTITATIVE, PREGNANCY: HCG, BETA CHAIN, QUANT, S: 627 m[IU]/mL — AB (ref <5)

## 2017-12-29 LAB — POCT PREGNANCY, URINE: PREG TEST UR: POSITIVE — AB

## 2017-12-29 NOTE — MAU Provider Note (Addendum)
Chief Complaint: Abdominal Pain   First Provider Initiated Contact with Patient 12/29/17 1847     SUBJECTIVE HPI: Cynthia GuestVeronica Valenzuela is a 22 y.o. G4P0120 at [id number] who presents to Maternity Admissions reporting: Low abd cramping since this morning  Vaginal Bleeding: denies Passage of tissue or clots: Denies Dizziness: Denies  A POS  Pain Location: low abd Quality: cramping Severity: 5/10 on pain scale Duration: ~12 hours Course: unchanged Context: early pregnancy Timing: intermittent Modifying factors: None. Hasn't tried anything for the pain.  Associated signs and symptoms: Neg for fever, chills, VB, urinary complaints or GI complaints  Past Medical History:  Diagnosis Date  . Headache   . Obesities, morbid (HCC)    OB History  Gravida Para Term Preterm AB Living  4 1 0 1 2 0  SAB TAB Ectopic Multiple Live Births  1 1 0 0 1    # Outcome Date GA Lbr Len/2nd Weight Sex Delivery Anes PTL Lv  4 Current           3 Preterm 02/09/16 [id number] 01:35 / 00:03 1 lb 6.6 oz (0.64 kg) M Vag-Spont None  DEC     Birth Comments: extreme prematurity  2 SAB 04/2015          1 TAB 11/2014            Obstetric Comments  Preterm child deceased   Past Surgical History:  Procedure Laterality Date  . WISDOM TOOTH EXTRACTION     Social History   Socioeconomic History  . Marital status: Single    Spouse name: Not on file  . Number of children: Not on file  . Years of education: Not on file  . Highest education level: Not on file  Occupational History  . Not on file  Social Needs  . Financial resource strain: Not on file  . Food insecurity:    Worry: Not on file    Inability: Not on file  . Transportation needs:    Medical: Not on file    Non-medical: Not on file  Tobacco Use  . Smoking status: Former Smoker    Last attempt to quit: 09/30/2015    Years since quitting: 2.2  . Smokeless tobacco: Never Used  Substance and Sexual Activity  . Alcohol use: No  . Drug use: No     Comment: 04/22/2015 quit using marijuana   . Sexual activity: Yes    Birth control/protection: None  Lifestyle  . Physical activity:    Days per week: Not on file    Minutes per session: Not on file  . Stress: Not on file  Relationships  . Social connections:    Talks on phone: Not on file    Gets together: Not on file    Attends religious service: Not on file    Active member of club or organization: Not on file    Attends meetings of clubs or organizations: Not on file    Relationship status: Not on file  . Intimate partner violence:    Fear of current or ex partner: Not on file    Emotionally abused: Not on file    Physically abused: Not on file    Forced sexual activity: Not on file  Other Topics Concern  . Not on file  Social History Narrative   ** Merged History Encounter **       No current facility-administered medications on file prior to encounter.    Current Outpatient Medications on File Prior to  Encounter  Medication Sig Dispense Refill  . ibuprofen (ADVIL,MOTRIN) 200 MG tablet Take 400-600 mg by mouth every 6 (six) hours as needed for cramping.     No Known Allergies  I have reviewed the past Medical Hx, Surgical Hx, Social Hx, Allergies and Medications.   Review of Systems  OBJECTIVE Patient Vitals for the past 24 hrs:  BP Temp Temp src Pulse Resp Height Weight  12/29/17 1808 (!) 110/52 98.4 F (36.9 C) Oral 93 18 5\' 5"  (1.651 m) (!) 329 lb (149.2 kg)   Constitutional: Well-developed, well-nourished, morbidly obese female in no acute distress.  Cardiovascular: normal rate Respiratory: normal rate and effort.  GI: Abd soft, non-tender. MS: Extremities nontender, no edema, normal ROM Neurologic: Alert and oriented x 4.  GU: Neg CVAT.  SPECULUM EXAM: NEFG, physiologic discharge, no blood noted, cervix clean  BIMANUAL: cervix closed; UTA uterine normal 2/2 body habitus, no adnexal tenderness or masses. No CMT.  LAB RESULTS Results for orders placed or  performed during the hospital encounter of 12/29/17 (from the past 24 hour(s))  Urinalysis, Routine w reflex microscopic     Status: Abnormal   Collection Time: 12/29/17  6:10 PM  Result Value Ref Range   Color, Urine YELLOW YELLOW   APPearance CLEAR CLEAR   Specific Gravity, Urine 1.035 (H) 1.005 - 1.030   pH 5.0 5.0 - 8.0   Glucose, UA NEGATIVE NEGATIVE mg/dL   Hgb urine dipstick NEGATIVE NEGATIVE   Bilirubin Urine NEGATIVE NEGATIVE   Ketones, ur NEGATIVE NEGATIVE mg/dL   Protein, ur 30 (A) NEGATIVE mg/dL   Nitrite NEGATIVE NEGATIVE   Leukocytes, UA NEGATIVE NEGATIVE   RBC / HPF 0-5 0 - 5 RBC/hpf   WBC, UA 0-5 0 - 5 WBC/hpf   Bacteria, UA RARE (A) NONE SEEN   Squamous Epithelial / LPF 0-5 (A) NONE SEEN   Mucus PRESENT   Pregnancy, urine POC     Status: Abnormal   Collection Time: 12/29/17  6:19 PM  Result Value Ref Range   Preg Test, Ur POSITIVE (A) NEGATIVE  Wet prep, genital     Status: Abnormal   Collection Time: 12/29/17  6:56 PM  Result Value Ref Range   Yeast Wet Prep HPF POC NONE SEEN NONE SEEN   Trich, Wet Prep NONE SEEN NONE SEEN   Clue Cells Wet Prep HPF POC NONE SEEN NONE SEEN   WBC, Wet Prep HPF POC FEW (A) NONE SEEN   Sperm NONE SEEN   hCG, quantitative, pregnancy     Status: Abnormal   Collection Time: 12/29/17  7:15 PM  Result Value Ref Range   hCG, Beta Chain, Quant, S 627 (H) <5 mIU/mL  CBC     Status: None   Collection Time: 12/29/17  7:15 PM  Result Value Ref Range   WBC 8.6 4.0 - 10.5 K/uL   RBC 4.38 3.87 - 5.11 MIL/uL   Hemoglobin 12.7 12.0 - 15.0 g/dL   HCT 16.1 09.6 - 04.5 %   MCV 86.8 78.0 - 100.0 fL   MCH 29.0 26.0 - 34.0 pg   MCHC 33.4 30.0 - 36.0 g/dL   RDW 40.9 81.1 - 91.4 %   Platelets 343 150 - 400 K/uL    IMAGING US Ob Comp Less 14 Wks  Result Date: 12/29/2017 CLINICAL DATA:  Abdominal pain and cramping. Positive pregnancy test. EXAM: OBSTETRIC <14 WK [place] AND TRANSVAGINAL OB US TECHNIQUE: Both transabdominal and transvaginal  ultrasound examinations were performed for complete  evaluation of the gestation as well as the maternal uterus, adnexal regions, and pelvic cul-de-sac. Transvaginal technique was performed to assess early pregnancy. COMPARISON:  None. FINDINGS: Intrauterine gestational sac: Probable very tiny intrauterine gestational sac towards the fundus. Yolk sac:  Not visualized. Embryo:  Not visualized. Cardiac Activity: N/a. MSD: 2.8 mm   4 w   6 d Subchorionic hemorrhage:  None visualized. Maternal uterus/adnexae: Maternal ovaries unremarkable with corpus luteum cyst noted in the right ovary. Moderate volume simple appearing free fluid noted in central pelvis and right adnexal space. IMPRESSION: 1. Probable very tiny intrauterine gestational sac with measurements suggesting estimated 4 week 6 day gestational age. Lack of visualized embryo or yolk sac precludes definite confirmation of intrauterine gestation. No adnexal mass is evident although at this point close follow-up will be required to exclude nonvisualized ectopic gestation. 2. Moderate volume simple appearing free fluid in the pelvis. Electronically Signed   By: Kennith Center M.D.   On: 12/29/2017 20:56   US Ob Transvaginal  Result Date: 12/29/2017 CLINICAL DATA:  Abdominal pain and cramping. Positive pregnancy test. EXAM: OBSTETRIC <14 WK [place] AND TRANSVAGINAL OB US TECHNIQUE: Both transabdominal and transvaginal ultrasound examinations were performed for complete evaluation of the gestation as well as the maternal uterus, adnexal regions, and pelvic cul-de-sac. Transvaginal technique was performed to assess early pregnancy. COMPARISON:  None. FINDINGS: Intrauterine gestational sac: Probable very tiny intrauterine gestational sac towards the fundus. Yolk sac:  Not visualized. Embryo:  Not visualized. Cardiac Activity: N/a. MSD: 2.8 mm   4 w   6 d Subchorionic hemorrhage:  None visualized. Maternal uterus/adnexae: Maternal ovaries unremarkable with corpus luteum  cyst noted in the right ovary. Moderate volume simple appearing free fluid noted in central pelvis and right adnexal space. IMPRESSION: 1. Probable very tiny intrauterine gestational sac with measurements suggesting estimated 4 week 6 day gestational age. Lack of visualized embryo or yolk sac precludes definite confirmation of intrauterine gestation. No adnexal mass is evident although at this point close follow-up will be required to exclude nonvisualized ectopic gestation. 2. Moderate volume simple appearing free fluid in the pelvis. Electronically Signed   By: Kennith Center M.D.   On: 12/29/2017 20:56    MAU COURSE CBC, Quant, ABO/Rh, ultrasound, wet prep and GC/chlamydia culture, UA  [place] results pending. Report given to Steward Drone, CNM at 2005  MDM Abd pain in early pregnancy with pregnancy of unknown anatomic location, but hemodynamically stable.  ASSESSMENT 1. Pregnancy of unknown anatomic location   2. Abdominal pain during pregnancy in first trimester     PLAN Discharge home in stable condition. Ectopic precautions Quant 01/01/18   Katrinka Blazing, [place], CNM 12/29/2017  8:05 PM  [place] results showed probable GS with no yolk sac, will follow up in office on Wednesday morning for repeat labs. Ectopic precautions given to pain and reasons to return to MAU. Pt discharged in stable condition.  Sharyon Cable, CNM 12/30/17, 12:54 AM

## 2017-12-29 NOTE — MAU Note (Signed)
5 + HPT, lower abdominal cramping since this morning. No vaginal bleeding, no vaginal discharge.

## 2017-12-30 LAB — GC/CHLAMYDIA PROBE AMP (~~LOC~~) NOT AT ARMC
CHLAMYDIA, DNA PROBE: NEGATIVE
NEISSERIA GONORRHEA: NEGATIVE

## 2017-12-30 LAB — HIV ANTIBODY (ROUTINE TESTING W REFLEX): HIV SCREEN 4TH GENERATION: NONREACTIVE

## 2018-01-01 ENCOUNTER — Ambulatory Visit: Payer: Self-pay

## 2018-01-01 DIAGNOSIS — O3680X0 Pregnancy with inconclusive fetal viability, not applicable or unspecified: Secondary | ICD-10-CM

## 2018-01-01 LAB — HCG, QUANTITATIVE, PREGNANCY: hCG, Beta Chain, Quant, S: 1813 m[IU]/mL — ABNORMAL HIGH (ref <5)

## 2018-01-01 NOTE — Progress Notes (Signed)
Pt here today for STAT beta lab.  Pt denies any sx at this time.  Pt notified that she will need to remain in the waiting room for the duration of test.  Pt stated understanding.   Notified Dr. Earlene Plateravis pt STAT beta results.  Provider recommended a f/u US scheduled in one week. OB US scheduled for April 2nd @ 1530.  Pt given ectopic/miscarriage precautions and US appt.  Pt stated understanding with no further questions.

## 2018-01-08 ENCOUNTER — Encounter: Payer: Self-pay | Admitting: Family Medicine

## 2018-01-08 ENCOUNTER — Ambulatory Visit (HOSPITAL_COMMUNITY)
Admission: RE | Admit: 2018-01-08 | Discharge: 2018-01-08 | Disposition: A | Discharge status: Home / Self Care | Payer: Medicaid Other | Source: Ambulatory Visit | Attending: Obstetrics and Gynecology | Admitting: Obstetrics and Gynecology

## 2018-01-08 ENCOUNTER — Ambulatory Visit (INDEPENDENT_AMBULATORY_CARE_PROVIDER_SITE_OTHER): Payer: Self-pay | Admitting: General Practice

## 2018-01-08 DIAGNOSIS — O283 Abnormal ultrasonic finding on antenatal screening of mother: Secondary | ICD-10-CM | POA: Insufficient documentation

## 2018-01-08 DIAGNOSIS — O3680X0 Pregnancy with inconclusive fetal viability, not applicable or unspecified: Secondary | ICD-10-CM

## 2018-01-08 DIAGNOSIS — Z3A01 Less than 8 weeks gestation of pregnancy: Secondary | ICD-10-CM | POA: Insufficient documentation

## 2018-01-08 NOTE — Progress Notes (Signed)
Patient here for viability results today. Reviewed results with Dr Ashok PallWouk who finds progression of pregnancy compared to last ultrasound- will repeat in 7-10 days.  Informed patient of results & scheduled follow up ultrasound 4/12. Patient had no questions

## 2018-01-10 ENCOUNTER — Encounter (HOSPITAL_COMMUNITY): Payer: Self-pay | Admitting: Advanced Practice Midwife

## 2018-01-10 ENCOUNTER — Other Ambulatory Visit: Payer: Self-pay

## 2018-01-10 ENCOUNTER — Inpatient Hospital Stay (HOSPITAL_COMMUNITY): Payer: Self-pay

## 2018-01-10 ENCOUNTER — Inpatient Hospital Stay (HOSPITAL_COMMUNITY)
Admission: AD | Admit: 2018-01-10 | Discharge: 2018-01-10 | Disposition: A | Discharge status: Home / Self Care | Payer: Self-pay | Source: Ambulatory Visit | Attending: Obstetrics and Gynecology | Admitting: Obstetrics and Gynecology

## 2018-01-10 DIAGNOSIS — N939 Abnormal uterine and vaginal bleeding, unspecified: Secondary | ICD-10-CM

## 2018-01-10 DIAGNOSIS — Z87891 Personal history of nicotine dependence: Secondary | ICD-10-CM | POA: Insufficient documentation

## 2018-01-10 DIAGNOSIS — O039 Complete or unspecified spontaneous abortion without complication: Secondary | ICD-10-CM

## 2018-01-10 DIAGNOSIS — Z3A01 Less than 8 weeks gestation of pregnancy: Secondary | ICD-10-CM

## 2018-01-10 DIAGNOSIS — O209 Hemorrhage in early pregnancy, unspecified: Secondary | ICD-10-CM

## 2018-01-10 LAB — URINALYSIS, ROUTINE W REFLEX MICROSCOPIC
BILIRUBIN URINE: NEGATIVE
Glucose, UA: NEGATIVE mg/dL
Ketones, ur: NEGATIVE mg/dL
LEUKOCYTES UA: NEGATIVE
Nitrite: NEGATIVE
Protein, ur: NEGATIVE mg/dL
SPECIFIC GRAVITY, URINE: 1.015 (ref 1.005–1.030)
pH: 6 (ref 5.0–8.0)

## 2018-01-10 LAB — HCG, QUANTITATIVE, PREGNANCY: HCG, BETA CHAIN, QUANT, S: 13929 m[IU]/mL — AB (ref <5)

## 2018-01-10 NOTE — MAU Provider Note (Addendum)
Chief Complaint: Vaginal Bleeding and Abdominal Pain   First Provider Initiated Contact with Patient 01/10/18 0734        SUBJECTIVE HPI: Cynthia Valenzuela is a 22 y.o. G4P0120 at [redacted]w[redacted]d by LMP who presents to maternity admissions reporting vaginal bleeding since this morning at 6. Some mild cramping.  Has been followed for early pregnancy progression with latest [place] being done 2 days ago.  HCG levels doubled last week and US showed progression from tiny GS to GS with yolk sac. . She denies vaginal itching/burning, urinary symptoms, h/a, dizziness, n/v, or fever/chills.    Vaginal Bleeding  The patient's primary symptoms include pelvic pain and vaginal bleeding. The patient's pertinent negatives include no genital itching, genital lesions or genital odor. Associated symptoms include abdominal pain. Pertinent negatives include no constipation, diarrhea, dysuria, fever, nausea or vomiting. The vaginal discharge was bloody. The vaginal bleeding is lighter than menses. She has not been passing clots. She has not been passing tissue. Nothing aggravates the symptoms. She has tried nothing for the symptoms.  Abdominal Pain  This is a new problem. The current episode started today. The problem occurs intermittently. The pain is located in the suprapubic region. The pain is mild. The quality of the pain is cramping. Pertinent negatives include no constipation, diarrhea, dysuria, fever, nausea or vomiting. Nothing aggravates the pain. The pain is relieved by nothing. She has tried nothing for the symptoms.    Past Medical History:  Diagnosis Date  . Headache   . Obesities, morbid (HCC)   . Preterm labor    Past Surgical History:  Procedure Laterality Date  . CERVICAL CERCLAGE    . WISDOM TOOTH EXTRACTION     Social History   Socioeconomic History  . Marital status: Single    Spouse name: Not on file  . Number of children: Not on file  . Years of education: Not on file  . Highest education level:  Not on file  Occupational History  . Not on file  Social Needs  . Financial resource strain: Not on file  . Food insecurity:    Worry: Not on file    Inability: Not on file  . Transportation needs:    Medical: Not on file    Non-medical: Not on file  Tobacco Use  . Smoking status: Former Smoker    Types: Cigarettes    Last attempt to quit: 09/30/2015    Years since quitting: 2.2  . Smokeless tobacco: Never Used  . Tobacco comment: vapes  Substance and Sexual Activity  . Alcohol use: No  . Drug use: No    Comment: 04/22/2015 quit using marijuana   . Sexual activity: Yes    Birth control/protection: None  Lifestyle  . Physical activity:    Days per week: Not on file    Minutes per session: Not on file  . Stress: Not on file  Relationships  . Social connections:    Talks on phone: Not on file    Gets together: Not on file    Attends religious service: Not on file    Active member of club or organization: Not on file    Attends meetings of clubs or organizations: Not on file    Relationship status: Not on file  . Intimate partner violence:    Fear of current or ex partner: Not on file    Emotionally abused: Not on file    Physically abused: Not on file    Forced sexual activity: Not  on file  Other Topics Concern  . Not on file  Social History Narrative   ** Merged History Encounter **       No current facility-administered medications on file prior to encounter.    No current outpatient medications on file prior to encounter.   No Known Allergies  I have reviewed patient's Past Medical Hx, Surgical Hx, Family Hx, Social Hx, medications and allergies.   ROS:  Review of Systems  Constitutional: Negative for fever.  Gastrointestinal: Positive for abdominal pain. Negative for constipation, diarrhea, nausea and vomiting.  Genitourinary: Positive for pelvic pain and vaginal bleeding. Negative for dysuria.   Review of Systems  Other systems negative   Physical  Exam  Physical Exam Patient Vitals for the past 24 hrs:  BP Temp Temp src Pulse Resp Weight  01/10/18 0732 129/75 98.5 F (36.9 C) Oral 85 18 -  01/10/18 0718 - - - - - (!) 331 lb 12 oz (150.5 kg)   Constitutional: Well-developed, well-nourished female in no acute distress.  Cardiovascular: normal rate Respiratory: normal effort GI: Abd soft, non-tender. Pos BS x 4 MS: Extremities nontender, no edema, normal ROM Neurologic: Alert and oriented x 4.  GU: Neg CVAT.  PELVIC EXAM: Cervix long and closed                            Moderate dark red blood in vault  FHT n/a  LAB RESULTS     IMAGING Kore[place] Ob Comp Less 14 Wks  Result Date: 12/29/2017 CLINICAL DATA:  Abdominal pain and cramping. Positive pregnancy test. EXAM: OBSTETRIC <14 WK US AND TRANSVAGINAL OB US TECHNIQUE: Both transabdominal and transvaginal ultrasound examinations were performed for complete evaluation of the gestation as well as the maternal uterus, adnexal regions, and pelvic cul-de-sac. Transvaginal technique was performed to assess early pregnancy. COMPARISON:  None. FINDINGS: Intrauterine gestational sac: Probable very tiny intrauterine gestational sac towards the fundus. Yolk sac:  Not visualized. Embryo:  Not visualized. Cardiac Activity: N/a. MSD: 2.8 mm   4 w   6 d Subchorionic hemorrhage:  None visualized. Maternal uterus/adnexae: Maternal ovaries unremarkable with corpus luteum cyst noted in the right ovary. Moderate volume simple appearing free fluid noted in central pelvis and right adnexal space. IMPRESSION: 1. Probable very tiny intrauterine gestational sac with measurements suggesting estimated 4 week 6 day gestational age. Lack of visualized embryo or yolk sac precludes definite confirmation of intrauterine gestation. No adnexal mass is evident although at this point close follow-up will be required to exclude nonvisualized ectopic gestation. 2. Moderate volume simple appearing free fluid in the pelvis.  Electronically Signed   By: Kennith CenterEric  Mansell M.D.   On: 12/29/2017 20:56   [place] Ob Transvaginal  Result Date: 01/08/2018 CLINICAL DATA:  Abdominal pain. 1st trimester pregnancy of unknown anatomic location. EXAM: TRANSVAGINAL OB ULTRASOUND TECHNIQUE: Transvaginal ultrasound was performed for complete evaluation of the gestation as well as the maternal uterus, adnexal regions, and pelvic cul-de-sac. COMPARISON:  12/29/2017 FINDINGS: Intrauterine gestational sac: Single Yolk sac:  Visualized. Embryo:  Not Visualized. MSD: 13 mm   6 w   1 d Subchorionic hemorrhage:  None visualized. Maternal uterus/adnexae: Retroverted uterus. Normal appearance of left ovary. Small right ovarian corpus luteum noted. No adnexal mass identified. Small amount of simple free fluid seen. IMPRESSION: Single intrauterine gestational sac measuring 6 weeks 1 day by mean sac diameter. Appropriate progression since prior study. Consider following serial b-hCG levels, with  followup ultrasound to assess viability in 10 days. Electronically Signed   By: Myles Rosenthal M.D.   On: 01/08/2018 16:01   US Ob Transvaginal  Result Date: 12/29/2017 CLINICAL DATA:  Abdominal pain and cramping. Positive pregnancy test. EXAM: OBSTETRIC <14 WK [place] AND TRANSVAGINAL OB US TECHNIQUE: Both transabdominal and transvaginal ultrasound examinations were performed for complete evaluation of the gestation as well as the maternal uterus, adnexal regions, and pelvic cul-de-sac. Transvaginal technique was performed to assess early pregnancy. COMPARISON:  None. FINDINGS: Intrauterine gestational sac: Probable very tiny intrauterine gestational sac towards the fundus. Yolk sac:  Not visualized. Embryo:  Not visualized. Cardiac Activity: N/a. MSD: 2.8 mm   4 w   6 d Subchorionic hemorrhage:  None visualized. Maternal uterus/adnexae: Maternal ovaries unremarkable with corpus luteum cyst noted in the right ovary. Moderate volume simple appearing free fluid noted in central pelvis  and right adnexal space. IMPRESSION: 1. Probable very tiny intrauterine gestational sac with measurements suggesting estimated 4 week 6 day gestational age. Lack of visualized embryo or yolk sac precludes definite confirmation of intrauterine gestation. No adnexal mass is evident although at this point close follow-up will be required to exclude nonvisualized ectopic gestation. 2. Moderate volume simple appearing free fluid in the pelvis. Electronically Signed   By: Kennith Center M.D.   On: 12/29/2017 20:56    MAU Management/MDM: Ordered repeat Quant HCG level.  [place] was 2 days ago so I don't think it would lend anything to repeat  This bleeding/pain can represent a normal pregnancy with bleeding, or threatened spontaneous abortion which can be life-threatening.  The process as listed above helps to determine which of these is present.   ASSESSMENT Single intrauterine pregnancy at [redacted]w[redacted]d Bleeding in the first trimester  PLAN Report given to oncoming provider  Discussed beta hcg level with the patient. Assessed vaginal bleeding, bleeding moderate with blood dripping down patient's leg when she stood up. [place] ordered. A positive blood type   US Ob Transvaginal  Result Date: 01/10/2018 CLINICAL DATA:  Heavy vaginal bleeding.  Threatened abortion. EXAM: TRANSVAGINAL OB ULTRASOUND TECHNIQUE: Transvaginal ultrasound was performed for complete evaluation of the gestation as well as the maternal uterus, adnexal regions, and pelvic cul-de-sac. COMPARISON:  01/08/2018 FINDINGS: Intrauterine gestational sac: None; previously seen intrauterine gestational sac is no longer visualized. Maternal uterus/adnexae: Right ovarian corpus luteum cyst again noted. Normal appearance of left ovary. No adnexal mass identified. Tiny amount of simple free fluid again noted. IMPRESSION: IUP no longer visualized, consistent with interval spontaneous abortion. Electronically Signed   By: Myles Rosenthal M.D.   On: 01/10/2018 11:12    US Ob Transvaginal  Result Date: 01/08/2018 CLINICAL DATA:  Abdominal pain. 1st trimester pregnancy of unknown anatomic location. EXAM: TRANSVAGINAL OB ULTRASOUND TECHNIQUE: Transvaginal ultrasound was performed for complete evaluation of the gestation as well as the maternal uterus, adnexal regions, and pelvic cul-de-sac. COMPARISON:  12/29/2017 FINDINGS: Intrauterine gestational sac: Single Yolk sac:  Visualized. Embryo:  Not Visualized. MSD: 13 mm   6 w   1 d Subchorionic hemorrhage:  None visualized. Maternal uterus/adnexae: Retroverted uterus. Normal appearance of left ovary. Small right ovarian corpus luteum noted. No adnexal mass identified. Small amount of simple free fluid seen. IMPRESSION: Single intrauterine gestational sac measuring 6 weeks 1 day by mean sac diameter. Appropriate progression since prior study. Consider following serial b-hCG levels, with followup ultrasound to assess viability in 10 days. Electronically Signed   By: Myles Rosenthal M.D.   On:  01/08/2018 16:01    Wynelle Bourgeois CNM, MSN Certified Nurse-Midwife 01/10/2018  7:35 AM  A:  1. SAB (spontaneous abortion)   2. Vaginal bleeding in pregnancy, first trimester   3. [redacted] weeks gestation of pregnancy   4. Episode of heavy vaginal bleeding     P:  Discharge home in stable condition  Follow up in the clinic in 1 week and then in 2 weeks. Message sent to the clinic to schedule.  Bleeding precautions Support given.   Duane Lope, NP 01/10/2018 12:57 PM

## 2018-01-10 NOTE — Discharge Instructions (Signed)

## 2018-01-10 NOTE — MAU Note (Signed)
Noted bright red blood when got up today when went to the restroom.  No clots.  This is a new problem.  Having some mild cramping- ongoing issue.

## 2018-01-17 ENCOUNTER — Ambulatory Visit (HOSPITAL_COMMUNITY): Payer: Self-pay

## 2018-02-12 ENCOUNTER — Encounter: Payer: Self-pay | Admitting: Obstetrics and Gynecology

## 2018-09-21 ENCOUNTER — Other Ambulatory Visit: Payer: Self-pay | Admitting: Student

## 2018-09-21 ENCOUNTER — Inpatient Hospital Stay (HOSPITAL_COMMUNITY): Payer: Self-pay

## 2018-09-21 ENCOUNTER — Inpatient Hospital Stay (HOSPITAL_COMMUNITY)
Admission: AD | Admit: 2018-09-21 | Discharge: 2018-09-21 | Disposition: A | Discharge status: Home / Self Care | Payer: Self-pay | Source: Ambulatory Visit | Attending: Obstetrics & Gynecology | Admitting: Obstetrics & Gynecology

## 2018-09-21 ENCOUNTER — Other Ambulatory Visit: Payer: Self-pay

## 2018-09-21 ENCOUNTER — Encounter (HOSPITAL_COMMUNITY): Payer: Self-pay

## 2018-09-21 DIAGNOSIS — R109 Unspecified abdominal pain: Secondary | ICD-10-CM | POA: Insufficient documentation

## 2018-09-21 DIAGNOSIS — O2691 Pregnancy related conditions, unspecified, first trimester: Secondary | ICD-10-CM | POA: Insufficient documentation

## 2018-09-21 DIAGNOSIS — O3680X0 Pregnancy with inconclusive fetal viability, not applicable or unspecified: Secondary | ICD-10-CM

## 2018-09-21 LAB — COMPREHENSIVE METABOLIC PANEL
ALT: 49 U/L — ABNORMAL HIGH (ref 0–44)
AST: 29 U/L (ref 15–41)
Albumin: 3.2 g/dL — ABNORMAL LOW (ref 3.5–5.0)
Alkaline Phosphatase: 50 U/L (ref 38–126)
Anion gap: 8 (ref 5–15)
BUN: 10 mg/dL (ref 6–20)
CO2: 22 mmol/L (ref 22–32)
Calcium: 9 mg/dL (ref 8.9–10.3)
Chloride: 103 mmol/L (ref 98–111)
Creatinine, Ser: 0.49 mg/dL (ref 0.44–1.00)
GFR calc Af Amer: >60 mL/min (ref >60)
GFR calc non Af Amer: >60 mL/min (ref >60)
Glucose, Bld: 155 mg/dL — ABNORMAL HIGH (ref 70–99)
Potassium: 3.9 mmol/L (ref 3.5–5.1)
Sodium: 133 mmol/L — ABNORMAL LOW (ref 135–145)
Total Bilirubin: 0.8 mg/dL (ref 0.3–1.2)
Total Protein: 6.5 g/dL (ref 6.5–8.1)

## 2018-09-21 LAB — URINALYSIS, MICROSCOPIC (REFLEX)

## 2018-09-21 LAB — CBC
HCT: 38.5 % (ref 36.0–46.0)
Hemoglobin: 12.6 g/dL (ref 12.0–15.0)
MCH: 29.3 pg (ref 26.0–34.0)
MCHC: 32.7 g/dL (ref 30.0–36.0)
MCV: 89.5 fL (ref 80.0–100.0)
Platelets: 339 10*3/uL (ref 150–400)
RBC: 4.3 MIL/uL (ref 3.87–5.11)
RDW: 14.3 % (ref 11.5–15.5)
WBC: 7.4 10*3/uL (ref 4.0–10.5)
nRBC: 0 % (ref 0.0–0.2)

## 2018-09-21 LAB — URINALYSIS, ROUTINE W REFLEX MICROSCOPIC
BILIRUBIN URINE: NEGATIVE
Glucose, UA: NEGATIVE mg/dL
Hgb urine dipstick: NEGATIVE
Ketones, ur: NEGATIVE mg/dL
Nitrite: NEGATIVE
Protein, ur: NEGATIVE mg/dL
SPECIFIC GRAVITY, URINE: 1.015 (ref 1.005–1.030)
pH: 7 (ref 5.0–8.0)

## 2018-09-21 LAB — WET PREP, GENITAL
Clue Cells Wet Prep HPF POC: NONE SEEN
Sperm: NONE SEEN
Trich, Wet Prep: NONE SEEN
Yeast Wet Prep HPF POC: NONE SEEN

## 2018-09-21 LAB — HCG, QUANTITATIVE, PREGNANCY: hCG, Beta Chain, Quant, S: 2053 m[IU]/mL — ABNORMAL HIGH (ref <5)

## 2018-09-21 NOTE — Discharge Instructions (Signed)
Ectopic Pregnancy °An ectopic pregnancy happens when a fertilized egg grows outside the uterus. A pregnancy cannot live outside of the uterus. This problem often happens in the fallopian tube. It is often caused by damage to the fallopian tube. °If this problem is found early, you may be treated with medicine. If your tube tears or bursts open (ruptures), you will bleed inside. This is an emergency. You will need surgery. Get help right away. °What are the signs or symptoms? °You may have normal pregnancy symptoms at first. These include: °· Missing your period. °· Feeling sick to your stomach (nauseous). °· Being tired. °· Having tender breasts. ° °Then, you may start to have symptoms that are not normal. These include: °· Pain with sex (intercourse). °· Bleeding from the vagina. This includes light bleeding (spotting). °· Belly (abdomen) or lower belly cramping or pain. This may be felt on one side. °· A fast heartbeat (pulse). °· Passing out (fainting) after going poop (bowel movement). ° °If your tube tears, you may have symptoms such as: °· Really bad pain in the belly or lower belly. This happens suddenly. °· Dizziness. °· Passing out. °· Shoulder pain. ° °Get help right away if: °You have any of these symptoms. This is an emergency. °This information is not intended to replace advice given to you by your health care provider. Make sure you discuss any questions you have with your health care provider. °Document Released: 12/21/2008 Document Revised: 03/01/2016 Document Reviewed: 05/06/2013 °Elsevier Interactive Patient Education © 2017 [employer] ° °

## 2018-09-21 NOTE — MAU Provider Note (Signed)
Patient Cynthia Valenzuela  Is a 22 y.o. [id number] At 1485w2d by LMP on October 25 here with complaints of abdominal cramping since Wednesday (5 days).  She denies abnormal discharge, vaginal bleeding, pain with urination, NV, diarrhea, SOB or other ob-gyn complaints.  She has a history of miscarriages and incompetent cervix; [facility]  History     CSN: [id number]  Arrival date and time: 09/21/18 [id number]   First Provider Initiated Contact with Patient 09/21/18 2040      Chief Complaint  Patient presents with  . Possible Pregnancy   Possible Pregnancy  This is a new problem. Associated symptoms include abdominal pain. Pertinent negatives include no nausea or vomiting.  Abdominal Pain  This is a new problem. The current episode started in the past 7 days. The problem occurs intermittently. The problem has been unchanged. The pain is located in the suprapubic region. The pain is at a severity of 2/10. The quality of the pain is cramping. Pertinent negatives include no diarrhea, dysuria, nausea or vomiting. Nothing aggravates the pain. The pain is relieved by nothing.    OB History    Gravida  5   Para  1   Term  0   Preterm  1   AB  3   Living  0     SAB  2   TAB  1   Ectopic  0   Multiple  0   Live Births  1        Obstetric Comments  Preterm child deceased        Past Medical History:  Diagnosis Date  . Headache   . Obesities, morbid (HCC)   . Preterm labor     Past Surgical History:  Procedure Laterality Date  . CERVICAL CERCLAGE    . WISDOM TOOTH EXTRACTION      Family History  Problem Relation Age of Onset  . Alcohol abuse Father     Social History   Tobacco Use  . Smoking status: Former Smoker    Types: Cigarettes    Last attempt to quit: 09/30/2015    Years since quitting: 2.9  . Smokeless tobacco: Never Used  . Tobacco comment: vapes  Substance Use Topics  . Alcohol use: No  . Drug use: No    Comment: 04/22/2015 quit  using marijuana     Allergies: No Known Allergies  Medications Prior to Admission  Medication Sig Dispense Refill Last Dose  . Prenatal Vit-Min-FA-Fish Oil ([facility] PRENATAL GUMMY) 0.4-113.5 MG CHEW Chew 1 tablet by mouth 2 (two) times daily.   01/09/2018 at Unknown time    Review of Systems  Gastrointestinal: Positive for abdominal pain. Negative for diarrhea, nausea and vomiting.  Genitourinary: Negative for dysuria.   Physical Exam   Blood pressure (!) 111/53, pulse (!) 106, temperature 98.2 F (36.8 C), temperature source Oral, resp. rate 16, height 5\' 5"  (1.651 m), weight (!) 155.1 kg, last menstrual period 08/01/2018, SpO2 100 %, unknown if currently breastfeeding.  Physical Exam  Constitutional: She is oriented to person, place, and time. She appears well-developed.  HENT:  Head: Normocephalic.  Neck: Normal range of motion.  Respiratory: Effort normal.  GI: Soft.  Musculoskeletal: Normal range of motion.  Neurological: She is alert and oriented to person, place, and time.  Skin: Skin is warm and dry.  Psychiatric: She has a normal mood and affect.  bimanual exam was deferred  MAU Course  Procedures  MDM -beta is 2053 -  US shows gestational sac but no yolk sac -based on this, cannot exclude ectopic pregnancy.  -GC cultures pending -wet prep neg  Assessment and Plan   1. Pregnancy of unknown anatomic location   2. Abdominal pain    2. Patient stable for discharge with plans to have stat beta hcg on Wednesday morning.   3. Reviewed strict ectopic precautions and follow-up plan; patient plans to keep appt at 2 pm on Wednesday for stat bhcg.    Charlesetta Garibaldi Davy Westmoreland 09/21/2018, 8:40 PM

## 2018-09-21 NOTE — MAU Note (Signed)
Pt had + HPT at home on 12/5. Now has mild  cramping x 2 days. Denies bleeding.  Is worried because she had an SAB in April.

## 2018-09-22 LAB — GC/CHLAMYDIA PROBE AMP (~~LOC~~) NOT AT ARMC
Chlamydia: NEGATIVE
Neisseria Gonorrhea: NEGATIVE

## 2018-09-24 ENCOUNTER — Ambulatory Visit (INDEPENDENT_AMBULATORY_CARE_PROVIDER_SITE_OTHER): Payer: Self-pay | Admitting: *Deleted

## 2018-09-24 DIAGNOSIS — O3680X0 Pregnancy with inconclusive fetal viability, not applicable or unspecified: Secondary | ICD-10-CM

## 2018-09-24 LAB — HCG, QUANTITATIVE, PREGNANCY: hCG, Beta Chain, Quant, S: 2597 m[IU]/mL — ABNORMAL HIGH (ref <5)

## 2018-09-24 NOTE — Progress Notes (Signed)
Patient seen and assessed by nursing staff.  Agree with documentation and plan.  

## 2018-09-24 NOTE — Progress Notes (Signed)
Here for stat bhcg. Denies pain. Denies bleeding. Explained we will draw stat bhcg and have her wait in lobby for results which we will then discuss with provider and then her. She voices understanding.  Legrand ComoLInda,RN

## 2018-09-24 NOTE — Progress Notes (Signed)
Discussed results with Dr. Shawnie PonsPratt and informed patient Cynthia Valenzuela did rise but not appropriately; but 20 percent of pregnancies do not rise as expected- can not rule out ectopic pregnancy. Advise repeat stat Cynthia Valenzuela on Friday at 10am since our office closes at 12. Also reviewed ectopic precautions with her.. She voices understanding.  Kashawn Manzano,RN

## 2018-09-26 ENCOUNTER — Ambulatory Visit (INDEPENDENT_AMBULATORY_CARE_PROVIDER_SITE_OTHER): Payer: Self-pay

## 2018-09-26 DIAGNOSIS — O3680X0 Pregnancy with inconclusive fetal viability, not applicable or unspecified: Secondary | ICD-10-CM

## 2018-09-26 LAB — HCG, QUANTITATIVE, PREGNANCY: hCG, Beta Chain, Quant, S: 3137 m[IU]/mL — ABNORMAL HIGH (ref <5)

## 2018-09-26 NOTE — Progress Notes (Signed)
Pt here today denies any pain or bleeding.  Advised that she will be here for at least two hours for results and follow up.  Pt stated understanding.  Notified Dr. Adrian BlackwaterStinson, provider recommendation is to have rpt US in two weeks from last US and inform pt of ectopic precautions.  OB US scheduled for 10/06/18 @ 1100. Notified pt of provider recommendation and if she has bleeding that saturates a pad and starts to have pain to please go to MAU.  Pt agreed.

## 2018-09-26 NOTE — Progress Notes (Signed)
Chart reviewed - agree with RN documentation.   

## 2018-10-06 ENCOUNTER — Ambulatory Visit (HOSPITAL_COMMUNITY)
Admission: RE | Admit: 2018-10-06 | Discharge: 2018-10-06 | Disposition: A | Discharge status: Home / Self Care | Payer: Self-pay | Source: Ambulatory Visit | Attending: Family Medicine | Admitting: Family Medicine

## 2018-10-06 ENCOUNTER — Ambulatory Visit (INDEPENDENT_AMBULATORY_CARE_PROVIDER_SITE_OTHER): Payer: Self-pay | Admitting: General Practice

## 2018-10-06 DIAGNOSIS — Z712 Person consulting for explanation of examination or test findings: Secondary | ICD-10-CM

## 2018-10-06 DIAGNOSIS — O3680X0 Pregnancy with inconclusive fetal viability, not applicable or unspecified: Secondary | ICD-10-CM

## 2018-10-06 NOTE — Progress Notes (Signed)
Patient presents to office today for viability ultrasound results. Called and reviewed results with Dr Macon LargeAnyanwu who finds slight progression/increased gestational sac size from last ultrasound but still no visualized yolk sac/fetal pole. Dr Macon LargeAnyanwu recommends bhcg today as still potential concern for ectopic pregnancy vs non viable pregnancy vs viable pregnancy. Patient will be called with results/updated plan of care once results are back tomorrow.  Informed patient of results and discussed bhcg lab today. Patient verbalized understanding to all & had no questions at this time. Patient denies pain/bleeding today. Ectopic precautions reviewed.  Chase Callerarrie H RN BSN 10/06/18

## 2018-10-06 NOTE — Progress Notes (Signed)
I have reviewed the chart and agree with nursing staff's documentation of this patient's encounter.  Cynthia CollinsUgonna Anyanwu, MD 10/06/2018 3:17 PM

## 2018-10-06 NOTE — Addendum Note (Signed)
Addended by: Eliot FordSALMONS, Taurus Willis L on: 10/06/2018 12:08 PM   Modules accepted: Orders

## 2018-10-07 ENCOUNTER — Telehealth: Payer: Self-pay | Admitting: General Practice

## 2018-10-07 LAB — BETA HCG QUANT (REF LAB): HCG QUANT: 2423 m[IU]/mL

## 2018-10-07 NOTE — Telephone Encounter (Signed)
-----   Message from Tereso NewcomerUgonna A Anyanwu, MD sent at 10/07/2018  9:41 AM EST ----- BHCG still plateauing, no significant drop in 11 days. Concerned about ectopic.  Patient needs to be offered methotrexate for this pregnancy of unknown location.

## 2018-10-07 NOTE — Telephone Encounter (Signed)
Called patient & informed her of abnormally decreasing bhcg results. Discussed recommendation is MTX for ectopic pregnancy which she would need to go to MAU to receive. Patient verbalized understanding and what medication was for. Discussed medication is treatment for ectopic pregnancy for her to pass the pregnancy. Patient verbalized understanding and states "so it's for certain I have an ectopic pregnancy." Told patient it is highly suspicious for an ectopic pregnancy and this is not a normal pregnancy that will progress. Patient verbalized understanding & states she will go to MAU. Patient had no other questions.

## 2018-10-08 ENCOUNTER — Inpatient Hospital Stay (HOSPITAL_COMMUNITY)
Admission: AD | Admit: 2018-10-08 | Discharge: 2018-10-08 | Disposition: A | Discharge status: Home / Self Care | Payer: Medicaid Other | Attending: Obstetrics and Gynecology | Admitting: Obstetrics and Gynecology

## 2018-10-08 DIAGNOSIS — Z87891 Personal history of nicotine dependence: Secondary | ICD-10-CM | POA: Insufficient documentation

## 2018-10-08 DIAGNOSIS — Z3A09 9 weeks gestation of pregnancy: Secondary | ICD-10-CM | POA: Insufficient documentation

## 2018-10-08 DIAGNOSIS — O021 Missed abortion: Secondary | ICD-10-CM | POA: Insufficient documentation

## 2018-10-08 LAB — CBC
HCT: 39.3 % (ref 36.0–46.0)
Hemoglobin: 12.7 g/dL (ref 12.0–15.0)
MCH: 28.9 pg (ref 26.0–34.0)
MCHC: 32.3 g/dL (ref 30.0–36.0)
MCV: 89.3 fL (ref 80.0–100.0)
Platelets: 392 10*3/uL (ref 150–400)
RBC: 4.4 MIL/uL (ref 3.87–5.11)
RDW: 14.2 % (ref 11.5–15.5)
WBC: 9.2 10*3/uL (ref 4.0–10.5)
nRBC: 0 % (ref 0.0–0.2)

## 2018-10-08 LAB — HCG, QUANTITATIVE, PREGNANCY: HCG, BETA CHAIN, QUANT, S: 2657 m[IU]/mL — AB (ref <5)

## 2018-10-08 MED ORDER — MISOPROSTOL 200 MCG PO TABS: ORAL_TABLET | ORAL | 1 refills | Status: DC

## 2018-10-08 MED ORDER — IBUPROFEN 600 MG PO TABS: 600.0000 mg | ORAL_TABLET | Freq: Four times a day (QID) | ORAL | 1 refills | Status: DC | PRN

## 2018-10-08 MED ORDER — OXYCODONE-ACETAMINOPHEN 5-325 MG PO TABS: 1.0000 | ORAL_TABLET | Freq: Four times a day (QID) | ORAL | 0 refills | Status: DC | PRN

## 2018-10-08 MED ORDER — PROMETHAZINE HCL 25 MG PO TABS: 25.0000 mg | ORAL_TABLET | Freq: Four times a day (QID) | ORAL | 2 refills | Status: DC | PRN

## 2018-10-08 NOTE — Discharge Instructions (Signed)
Incomplete Miscarriage  A miscarriage is the loss of an unborn baby (fetus) before the 20th week of pregnancy. In an incomplete miscarriage, parts of the fetus or placenta (afterbirth) remain in the body. Most miscarriages happen in the first 3 months of pregnancy. Sometimes, it happens before a woman even knows she is pregnant.  Having a miscarriage can be an emotional experience. If you have had a miscarriage, talk with your health care provider about any questions you may have about miscarrying, the grieving process, and your future pregnancy plans.  What are the causes?  This condition may be caused by:  · Problems with the genes or chromosomes that make it impossible for the baby to develop normally. These problems are most often the result of random errors that occur early in development, and are not passed from parent to child (not inherited).  · Infection of the cervix or uterus.  · Conditions that affect hormone balance in the body.  · Problems with the cervix, such as the cervix opening and thinning before pregnancy is at term (cervical insufficiency).  · Problems with the uterus, such as a uterus with an abnormal shape, fibroids in the uterus, or problems that were present from birth (congenital abnormalities).  · Certain medical conditions.  · Smoking, drinking alcohol, or using drugs.  · Injury (trauma).  Many times, the cause of a miscarriage is not known.  What are the signs or symptoms?  Symptoms of this condition include:  · Vaginal bleeding or spotting, with or without cramps or pain.  · Pain or cramping in the abdomen or lower back.  · Passing fluid, tissue, or blood clots from the vagina.  How is this diagnosed?  This condition may be diagnosed based on:  · A physical exam.  · Ultrasound.  · Blood tests.  · Urine tests.  How is this treated?  An incomplete miscarriage may be treated with:  · Dilation and curettage (D&C). This is a procedure in which the cervix is stretched open and the lining of  the uterus (endometrium) is scraped to remove any remaining tissue from the pregnancy.  · Medicines, such as:  ? Antibiotic medicine to treat infection.  ? Medicine to help any remaining tissue pass out of your uterus.  ? Medicine to reduce (contract) the size of the uterus. These medicines may be given if you have a lot of bleeding.  If you have Rh negative blood and your baby was Rh positive, you will need a shot of medicine called Rh immunoglobulinto protect future babies from Rh blood problems. "Rh-negative" and "Rh-positive" refer to whether or not the blood has a specific protein found on the surface of red blood cells (Rh factor).  Follow these instructions at home:  Medicines    · Take over-the-counter and prescription medicines only as told by your health care provider.  · If you were prescribed antibiotic medicine, take your antibiotic as told by your health care provider. Do not stop taking the antibiotic even if you start to feel better.  · Do not take NSAIDs, such as aspirin and ibuprofen, unless approved by your doctor. These medicines can cause bleeding.  Activity  · Rest as directed. Ask your health care provider what activities are safe for you.  · Have someone help with home and family responsibilities during this time.  General instructions  · Keep track of the number of sanitary pads you use each day and how soaked (saturated) they are. Write down this   information.  · Monitor the amount of tissue or blood clots that you pass from your vagina. Save any large amounts of tissue for your health care provider to examine.  · Do not use tampons, douche, or have sex until your health care provider approves.  · To help you and your partner with the process of grieving, talk with your health care provider or seek counseling to help cope with the pregnancy loss.  · When you are ready, meet with your health care provider to discuss important steps you should take for your health, as well as steps to take in  order to have a healthy pregnancy in the future.  · Keep all follow-up visits as told by your health care provider. This is important.  Where to find more information  · The American Congress of Obstetricians and Gynecologists: www.acog.org  · U.[place]. Department of Health and Human Services Office of Women’s Health: www.womenshealth.gov  Contact a health care provider if:  · You have a fever or chills.  · You have a foul smelling vaginal discharge.  Get help right away if:  · You have severe cramps or pain in your back or abdomen.  · You pass walnut-sized (or larger) blood clots or tissue from your vagina.  · You have heavy bleeding, soaking more than 1 regular sanitary pad in an hour.  · You become lightheaded or weak.  · You pass out.  · You have feelings of sadness that take over your thoughts, or you have thoughts of hurting yourself.  Summary  · In an incomplete miscarriage, parts of the fetus or placenta (afterbirth) remain in the body.  · There are multiple treatment options for an incomplete miscarriage, talk to your health care provider about the best option for you.  · Follow your health care provider's instructions for follow-up care.  · To help you and your partner with the process of grieving, talk with your health care provider or seek counseling to help cope with the pregnancy loss.  This information is not intended to replace advice given to you by your health care provider. Make sure you discuss any questions you have with your health care provider.  Document Released: 09/24/2005 Document Revised: 10/31/2016 Document Reviewed: 10/31/2016  Elsevier Interactive Patient Education © 2019 [employer]vier Inc.

## 2018-10-08 NOTE — MAU Provider Note (Signed)
Chief Complaint: No chief complaint on file.   First Provider Initiated Contact with Patient 10/08/18 2055        SUBJECTIVE HPI: Cynthia Valenzuela is a 23 y.o. [id number] at [id number] by LMP who presents to maternity admissions reporting needing a followup HCG level. Has been followed since 09/21/18 for abnormally rising HCG levels  Has had some cramping.  HCG levels have been staying around the same.  Dr Macon LargeAnyanwu felt that she needed to be offered Methotrexate but patient did not want to do that if it was not a confirmed ectopic pregnancy.    When I went to lobby to discuss results with her she is not there.   She was out there later.    Past Medical History:  Diagnosis Date  . Headache   . Obesities, morbid (H[facility])   . Preterm labor    Past Surgical History:  Procedure Laterality Date  . CERVICAL CERCLAGE    . WISDOM TOOTH EXTRACTION     Social History   Socioeconomic History  . Marital status: Married    Spouse name: Not on file  . Number of children: Not on file  . Years of education: Not on file  . Highest education level: Not on file  Occupational History  . Not on file  Social Needs  . Financial resource strain: Not on file  . Food insecurity:    Worry: Not on file    Inability: Not on file  . Transportation needs:    Medical: Not on file    Non-medical: Not on file  Tobacco Use  . Smoking status: Former Smoker    Types: Cigarettes    Last attempt to quit: 09/30/2015    Years since quitting: 3.0  . Smokeless tobacco: Never Used  . Tobacco comment: vapes  Substance and Sexual Activity  . Alcohol use: No  . Drug use: No    Comment: 04/22/2015 quit using marijuana   . Sexual activity: Yes    Birth control/protection: None  Lifestyle  . Physical activity:    Days per week: Not on file    Minutes per session: Not on file  . Stress: Not on file  Relationships  . Social connections:    Talks on phone: Not on file    Gets together: Not on file    Attends religious  service: Not on file    Active member of club or organization: Not on file    Attends meetings of clubs or organizations: Not on file    Relationship status: Not on file  . Intimate partner violence:    Fear of current or ex partner: Not on file    Emotionally abused: Not on file    Physically abused: Not on file    Forced sexual activity: Not on file  Other Topics Concern  . Not on file  Social History Narrative   ** Merged History Encounter **       No current facility-administered medications on file prior to encounter.    Current Outpatient Medications on File Prior to Encounter  Medication Sig Dispense Refill  . Prenatal Vit-Min-FA-Fish Oil ([facility] PRENATAL GUMMY) 0.4-113.5 MG CHEW Chew 1 tablet by mouth 2 (two) times daily.     No Known Allergies  I have reviewed patient's Past Medical Hx, Surgical Hx, Family Hx, Social Hx, medications and allergies.   ROS:  Review of Systems  Constitutional: Negative for chills and fever.  Genitourinary: Negative for difficulty urinating, pelvic pain and vaginal  bleeding.  Neurological: Negative for dizziness.   Review of Systems  Other systems negative Unable to perform ROS due to patient not being there.   Physical Exam  Physical Exam Constitutional:      Appearance: Normal appearance. She is obese.  Neck:     Musculoskeletal: Normal range of motion.  Pulmonary:     Effort: Pulmonary effort is normal.  Abdominal:     General: There is no distension.     Palpations: Abdomen is soft.     Tenderness: There is no abdominal tenderness. There is no guarding.  Musculoskeletal:        General: No swelling.  Neurological:     Mental Status: She is alert.   LAB RESULTS Results for orders placed or performed during the hospital encounter of 10/08/18 (from the past 24 hour(s))  CBC     Status: None   Collection Time: 10/08/18  7:45 PM  Result Value Ref Range   WBC 9.2 4.0 - 10.5 K/uL   RBC 4.40 3.87 - 5.11 MIL/uL   Hemoglobin 12.7  12.0 - 15.0 g/dL   HCT 10.239.3 72.536.0 - 36.646.0 %   MCV 89.3 80.0 - 100.0 fL   MCH 28.9 26.0 - 34.0 pg   MCHC 32.3 30.0 - 36.0 g/dL   RDW 44.014.2 34.711.5 - 42.515.5 %   Platelets 392 150 - 400 K/uL   nRBC 0.0 0.0 - 0.2 %  hCG, quantitative, pregnancy     Status: Abnormal   Collection Time: 10/08/18  7:55 PM  Result Value Ref Range   hCG, Beta Chain, Quant, S 2,657 (H) <5 mIU/mL    Ref. Range 10/06/2018 12:21  hCG Quant Latest Units: mIU/mL 2,423    Ref. Range 09/26/2018 09:47  HCG, Beta Chain, Quant, S Latest Ref Range: <5 mIU/mL 3,137 (H)    Ref. Range 09/24/2018 14:16  HCG, Beta Chain, Quant, S Latest Ref Range: <5 mIU/mL 2,597 (H)    Ref. Range 09/21/2018 20:38  HCG, Beta Chain, Quant, S Latest Ref Range: <5 mIU/mL 2,053 (H)      IMAGING [place] Ob Comp Less 14 Wks  Result Date: 09/21/2018 CLINICAL DATA:  Abdominal pain EXAM: OBSTETRIC <14 WK US AND TRANSVAGINAL OB US TECHNIQUE: Both transabdominal and transvaginal ultrasound examinations were performed for complete evaluation of the gestation as well as the maternal uterus, adnexal regions, and pelvic cul-de-sac. Transvaginal technique was performed to assess early pregnancy. COMPARISON:  None. FINDINGS: Intrauterine gestational sac: Single Yolk sac:  Not visualized Embryo:  Not visualized Cardiac Activity: Not visualized Heart Rate:   bpm MSD: 6.2 mm   5 w   2 d CRL:    mm    w    d                  US EDC: Subchorionic hemorrhage:  None visualized. Maternal uterus/adnexae: Uterus is retroflexed. No adnexal mass. No free fluid. IMPRESSION: Probable early intrauterine gestational sac, but no yolk sac, fetal pole, or cardiac activity yet visualized. Recommend follow-up quantitative B-HCG levels and follow-up US in 14 days to assess viability. This recommendation follows SRU consensus guidelines: Diagnostic Criteria for Nonviable Pregnancy Early in the First Trimester. Malva Limes Engl J Med 2013; 956:3875-64; 369:1443-51. Electronically Signed   By: Charlett NoseKevin  Dover M.D.   On:  09/21/2018 21:52   [place] Ob Transvaginal  Result Date: 10/06/2018 CLINICAL DATA:  Evaluate anatomic location of pregnancy. EXAM: TRANSVAGINAL OB ULTRASOUND TECHNIQUE: Transvaginal ultrasound was performed for complete evaluation of  the gestation as well as the maternal uterus, adnexal regions, and pelvic cul-de-sac. COMPARISON:  09/21/2018. FINDINGS: Intrauterine gestational sac: Single. Previously noted rounded intrauterine gestational sac now appears lobular and elongated. Yolk sac:  Not Visualized. Embryo:  Not Visualized. Cardiac Activity: Not Visualized. MSD: 7.23 mm   5 w   3 d Subchorionic hemorrhage:  None visualized. Maternal uterus/adnexae: Subchorionic hemorrhage: None Right ovary: Normal Left ovary: Normal Other :None Free fluid:  No free fluid. IMPRESSION: 1. There is a single, lobular and elongated intrauterine gestational sac without embryo. On 09/21/2018 there was a gestational sac without a yolk sac or embryo. Findings meet definitive criteria for failed pregnancy. This follows SRU consensus guidelines: Diagnostic Criteria for Nonviable Pregnancy Early in the First Trimester. Macy Mis J Med [phone]. Electronically Signed   By: Signa Kell M.D.   On: 10/06/2018 11:48   US Ob Transvaginal  Result Date: 09/21/2018 CLINICAL DATA:  Abdominal pain EXAM: OBSTETRIC <14 WK [place] AND TRANSVAGINAL OB US TECHNIQUE: Both transabdominal and transvaginal ultrasound examinations were performed for complete evaluation of the gestation as well as the maternal uterus, adnexal regions, and pelvic cul-de-sac. Transvaginal technique was performed to assess early pregnancy. COMPARISON:  None. FINDINGS: Intrauterine gestational sac: Single Yolk sac:  Not visualized Embryo:  Not visualized Cardiac Activity: Not visualized Heart Rate:   bpm MSD: 6.2 mm   5 w   2 d CRL:    mm    w    d                  [place] EDC: Subchorionic hemorrhage:  None visualized. Maternal uterus/adnexae: Uterus is retroflexed. No adnexal  mass. No free fluid. IMPRESSION: Probable early intrauterine gestational sac, but no yolk sac, fetal pole, or cardiac activity yet visualized. Recommend follow-up quantitative B-HCG levels and follow-up US in 14 days to assess viability. This recommendation follows SRU consensus guidelines: Diagnostic Criteria for Nonviable Pregnancy Early in the First Trimester. Malva Limes Med 2013; [id number]:9629-52. Electronically Signed   By: Charlett Nose M.D.   On: 09/21/2018 21:52    MAU Management/MDM: Discussed with Dr Jolayne Panther. She recommends Cytotec to clear the pregnancy.  Pregnancy is definitively abnormal but does not appear to suggest ectopic location, so she would recommend Cytotec.   Discussed findings with Patient and recommendation for Cytotec. She agrees.  Discussed what to expect.     ASSESSMENT Pregnancy at [redacted]w[redacted]d Failed pregnancy, missed abortion (elongated sac in uterus)  PLAN Discharge  Rx Cytotec buccally Rx Percocet for pain #10 Rx Phenergan for nausea Rx ibuprofen for pain Message sent to clinic for followup   Wynelle Bourgeois CNM, MSN Certified Nurse-Midwife 10/08/2018  8:55 PM

## 2018-10-08 NOTE — MAU Note (Signed)
Pt not in lobby x1 

## 2018-10-09 ENCOUNTER — Telehealth: Payer: Self-pay | Admitting: Family Medicine

## 2018-10-09 NOTE — Telephone Encounter (Signed)
Informed patient of scheduled appointment, stated she would be here.

## 2018-10-22 ENCOUNTER — Ambulatory Visit: Payer: Medicaid Other | Admitting: Nurse Practitioner

## 2018-10-29 ENCOUNTER — Ambulatory Visit: Payer: Medicaid Other | Admitting: Nurse Practitioner

## 2019-04-16 ENCOUNTER — Telehealth: Payer: Self-pay

## 2019-04-16 DIAGNOSIS — O3680X0 Pregnancy with inconclusive fetal viability, not applicable or unspecified: Secondary | ICD-10-CM

## 2019-04-16 NOTE — Telephone Encounter (Signed)
Received a call from [place], [place], that she attempted to do an [place] on the pt and was unsuccessful.  Baldo Ash stated that the pt informed her that she has a she consulted with Dr. Ilda Basset who recommended that we f/u with pt here. OB US scheduled for July 21st 0900.  Pt notified of [place] appt to f/u at MAU for bleeding or pain.

## 2019-04-20 ENCOUNTER — Other Ambulatory Visit: Payer: Self-pay

## 2019-04-20 NOTE — Progress Notes (Unsigned)
Radiology called wanting Pts [place] appointment for 04/28/2019 to be signed by provider Aletha Halim, MD. Advised will forward to him.

## 2019-04-20 NOTE — Progress Notes (Signed)
Done. thanks

## 2019-04-28 ENCOUNTER — Ambulatory Visit: Payer: Medicaid Other

## 2019-04-28 ENCOUNTER — Other Ambulatory Visit: Payer: Self-pay

## 2019-04-28 ENCOUNTER — Ambulatory Visit (HOSPITAL_COMMUNITY)
Admission: RE | Admit: 2019-04-28 | Discharge: 2019-04-28 | Disposition: A | Discharge status: Home / Self Care | Payer: Medicaid Other | Source: Ambulatory Visit | Attending: Obstetrics and Gynecology | Admitting: Obstetrics and Gynecology

## 2019-04-28 DIAGNOSIS — O3680X0 Pregnancy with inconclusive fetal viability, not applicable or unspecified: Secondary | ICD-10-CM | POA: Diagnosis not present

## 2019-04-29 ENCOUNTER — Encounter: Payer: Self-pay | Admitting: General Practice

## 2019-04-29 ENCOUNTER — Telehealth (INDEPENDENT_AMBULATORY_CARE_PROVIDER_SITE_OTHER): Payer: Medicaid Other | Admitting: General Practice

## 2019-04-29 DIAGNOSIS — O3680X0 Pregnancy with inconclusive fetal viability, not applicable or unspecified: Secondary | ICD-10-CM

## 2019-04-29 NOTE — Telephone Encounter (Signed)
Patient called and left message with MFM that she is requesting her ultrasound results from yesterday. Reviewed ultrasound with Dr Harolyn Rutherford who finds gestational sac but no yolk sac or fetal pole. Patient should have follow up ultrasound in 2 weeks. Scheduled for 8/5 @ 1pm.   Called patient stating I am returning her phone call for results. Asked patient if she has had bleeding or pain and she denies both. Informed patient of ultrasound results and discussed follow up ultrasound appt. Patient verbalized understanding and states she had a miscarriage earlier this year in January and had to take a pill for it because she had not had bleeding. Patient asked if she may have to do that again. Told patient by her next ultrasound we should definitely see everything we need to by that point. Discussed if nothing is still visualized then yes that could be a possibility. Patient verbalized understanding & had no questions.

## 2019-05-04 ENCOUNTER — Inpatient Hospital Stay (HOSPITAL_COMMUNITY): Payer: Medicaid Other

## 2019-05-04 ENCOUNTER — Other Ambulatory Visit: Payer: Self-pay

## 2019-05-04 ENCOUNTER — Inpatient Hospital Stay (HOSPITAL_COMMUNITY)
Admission: AD | Admit: 2019-05-04 | Discharge: 2019-05-04 | Disposition: A | Discharge status: Home / Self Care | Payer: Medicaid Other | Attending: Obstetrics and Gynecology | Admitting: Obstetrics and Gynecology

## 2019-05-04 ENCOUNTER — Encounter (HOSPITAL_COMMUNITY): Payer: Self-pay | Admitting: *Deleted

## 2019-05-04 ENCOUNTER — Telehealth: Payer: Self-pay | Admitting: *Deleted

## 2019-05-04 DIAGNOSIS — O209 Hemorrhage in early pregnancy, unspecified: Secondary | ICD-10-CM

## 2019-05-04 DIAGNOSIS — Z87891 Personal history of nicotine dependence: Secondary | ICD-10-CM | POA: Insufficient documentation

## 2019-05-04 DIAGNOSIS — O039 Complete or unspecified spontaneous abortion without complication: Secondary | ICD-10-CM | POA: Diagnosis present

## 2019-05-04 DIAGNOSIS — Z3A01 Less than 8 weeks gestation of pregnancy: Secondary | ICD-10-CM | POA: Diagnosis not present

## 2019-05-04 DIAGNOSIS — R209 Unspecified disturbances of skin sensation: Secondary | ICD-10-CM | POA: Diagnosis present

## 2019-05-04 HISTORY — DX: Unspecified infectious disease: B99.9

## 2019-05-04 LAB — CBC
HCT: 35.7 % — ABNORMAL LOW (ref 36.0–46.0)
Hemoglobin: 11.6 g/dL — ABNORMAL LOW (ref 12.0–15.0)
MCH: 28.4 pg (ref 26.0–34.0)
MCHC: 32.5 g/dL (ref 30.0–36.0)
MCV: 87.3 fL (ref 80.0–100.0)
Platelets: 342 10*3/uL (ref 150–400)
RBC: 4.09 MIL/uL (ref 3.87–5.11)
RDW: 13.5 % (ref 11.5–15.5)
WBC: 6.7 10*3/uL (ref 4.0–10.5)
nRBC: 0 % (ref 0.0–0.2)

## 2019-05-04 LAB — HCG, QUANTITATIVE, PREGNANCY: hCG, Beta Chain, Quant, S: 491 m[IU]/mL — ABNORMAL HIGH (ref <5)

## 2019-05-04 MED ORDER — OXYCODONE-ACETAMINOPHEN 5-325 MG PO TABS: 1.0000 | ORAL_TABLET | Freq: Four times a day (QID) | ORAL | 0 refills | Status: DC | PRN

## 2019-05-04 MED ORDER — IBUPROFEN 600 MG PO TABS: 600.0000 mg | ORAL_TABLET | Freq: Four times a day (QID) | ORAL | 0 refills | Status: DC | PRN

## 2019-05-04 MED ORDER — OXYCODONE-ACETAMINOPHEN 5-325 MG PO TABS
2.0000 | ORAL_TABLET | Freq: Once | ORAL | Status: AC
  Administered 2019-05-04: 2 via ORAL
  Filled 2019-05-04: qty 2

## 2019-05-04 NOTE — MAU Provider Note (Signed)
History     CSN: [id number]  Arrival date and time: 05/04/19 1508   First Provider Initiated Contact with Patient 05/04/19 1612      Chief Complaint  Patient presents with  . Vaginal Bleeding  . Abdominal Pain   HPI  [name] is a 23 y.o. year old [id number] female at 6.[redacted] weeks gestation by MSD on 7/21 U/S who presents to MAU reporting she started having heavy VB and abdominal pain/cramping at 0200 this AM. She had an U/S on 04/28/19 that had IUGS, no YS and no FP. She reports that she thinks she is having a miscarriage. She is still bleeding, but "not as bad now." She showed a pic from her cell phone of a large blood clot that she passed earlier today.  Past Medical History:  Diagnosis Date  . Headache   . Infection    UTI  . Obesities, morbid ([place])   . Preterm labor     Past Surgical History:  Procedure Laterality Date  . CERVICAL CERCLAGE    . WISDOM TOOTH EXTRACTION      Family History  Problem Relation Age of Onset  . Alcohol abuse Father     Social History   Tobacco Use  . Smoking status: Former Smoker    Types: Cigarettes, E-cigarettes    Quit date: 09/30/2015    Years since quitting: 3.5  . Smokeless tobacco: Never Used  . Tobacco comment: vapes  Substance Use Topics  . Alcohol use: Not Currently  . Drug use: No    Comment: 04/22/2015 quit using marijuana     Allergies: No Known Allergies  Medications Prior to Admission  Medication Sig Dispense Refill Last Dose  . acetaminophen (TYLENOL) 500 MG tablet Take 500 mg by mouth every 6 (six) hours as needed.   05/04/2019 at Unknown time  . Prenatal Vit-Min-FA-Fish Oil ([facility] PRENATAL GUMMY) 0.4-113.5 MG CHEW Chew 1 tablet by mouth 2 (two) times daily.   05/03/2019 at Unknown time  . ibuprofen (ADVIL,MOTRIN) 600 MG tablet Take 1 tablet (600 mg total) by mouth every 6 (six) hours as needed. 30 tablet 1 More than a month at Unknown time  . misoprostol (CYTOTEC) 200 MCG tablet Place four tablets in  between your gums and cheeks (two tablets on each side) as instructed 4 tablet 1   . promethazine (PHENERGAN) 25 MG tablet Take 1 tablet (25 mg total) by mouth every 6 (six) hours as needed for nausea or vomiting. 30 tablet 2   . [DISCONTINUED] oxyCODONE-acetaminophen (PERCOCET/ROXICET) 5-325 MG tablet Take 1-2 tablets by mouth every 6 (six) hours as needed for moderate pain. 10 tablet 0     Review of Systems  Constitutional: Negative.   HENT: Negative.   Eyes: Negative.   Respiratory: Negative.   Cardiovascular: Negative.   Gastrointestinal: Negative.   Endocrine: Negative.   Genitourinary: Positive for pelvic pain (cramping) and vaginal bleeding.  Musculoskeletal: Negative.   Skin: Negative.   Allergic/Immunologic: Negative.   Neurological: Negative.   Hematological: Negative.   Psychiatric/Behavioral: Negative.    Physical Exam   Blood pressure (!) 96/39, pulse 82, temperature 98.3 F (36.8 C), temperature source Oral, resp. rate 20, height 5\' 5"  (1.651 m), weight (!) 157.9 kg, last menstrual period 01/23/2019, SpO2 99 %.  Physical Exam  Nursing note and vitals reviewed. Constitutional: She is oriented to person, place, and time. She appears well-developed and well-nourished.  HENT:  Head: Normocephalic and atraumatic.  Eyes: Pupils are equal, round,  and reactive to light.  Neck: Normal range of motion.  Cardiovascular: Normal rate.  Respiratory: Effort normal.  GI: Soft.  Genitourinary:    Genitourinary Comments: Uterus: non-tender, SE: cervix is smooth, pink, no lesions, small amt of dark, red blood vaginal d/c -- cx: closed/long/firm, no CMT or friability, no adnexal tenderness    Musculoskeletal: Normal range of motion.  Neurological: She is alert and oriented to person, place, and time.  Skin: Skin is warm and dry.  Psychiatric: She has a normal mood and affect. Her behavior is normal. Judgment and thought content normal.   MAU Course   Procedures  MDM HCG CBC Known blood type: A POS Speculum Exam OB U/S <14 wks w/TV  Results for orders placed or performed during the hospital encounter of 05/04/19 (from the past 24 hour(s))  hCG, quantitative, pregnancy     Status: Abnormal   Collection Time: 05/04/19  4:30 PM  Result Value Ref Range   hCG, Beta Chain, Quant, S 491 (H) <5 mIU/mL  CBC     Status: Abnormal   Collection Time: 05/04/19  4:31 PM  Result Value Ref Range   WBC 6.7 4.0 - 10.5 K/uL   RBC 4.09 3.87 - 5.11 MIL/uL   Hemoglobin 11.6 (L) 12.0 - 15.0 g/dL   HCT 16.135.7 (L) 09.636.0 - 04.546.0 %   MCV 87.3 80.0 - 100.0 fL   MCH 28.4 26.0 - 34.0 pg   MCHC 32.5 30.0 - 36.0 g/dL   RDW 40.913.5 81.111.5 - 91.415.5 %   Platelets 342 150 - 400 K/uL   nRBC 0.0 0.0 - 0.2 %     *Consult with Dr. Linde GillisMaynard @ 1755 - notified of conflicting U/S report - request to addend the final report  U/S OB Transvaginal  Addendum Date: 05/04/2019   ADDENDUM REPORT: 05/04/2019 18:00 ADDENDUM: Error in report: There is NO yolk sac, embryo or cardiac activity visualized. Electronically Signed   By: Bary RichardStan  Maynard M.D.   On: 05/04/2019 18:00   Result Date: 05/04/2019 CLINICAL DATA:  Vaginal bleeding. EXAM: TRANSVAGINAL OB ULTRASOUND TECHNIQUE: Transvaginal ultrasound was performed for complete evaluation of the gestation as well as the maternal uterus, adnexal regions, and pelvic cul-de-sac. COMPARISON:  Obstetric ultrasound dated 04/28/2019. FINDINGS: Intrauterine gestational sac: None Yolk sac:  Visualized. Embryo:  Visualized. Cardiac Activity: Visualized. Subchorionic hemorrhage:  None visualized. Maternal uterus/adnexae: Endometrial complex is upper normal at 17 mm, slightly heterogeneous and slightly vascular. Previously demonstrated irregular intrauterine gestational sac is no longer present. LEFT ovary appears normal. No mass or free fluid identified in the LEFT adnexal region. RIGHT ovary is not seen but there is no mass or free fluid identified in the  RIGHT adnexal region. No significant free fluid in the cul-de-sac. IMPRESSION: 1. No intrauterine pregnancy. The previously demonstrated irregular intrauterine gestational sac is no longer visualized, indicating interval spontaneous abortion. 2. Endometrium is upper normal in thickness at 17 mm, slightly heterogeneous and slightly vascular. If bleeding persists, retained products of conception cannot be excluded. 3. No mass or free fluid within either adnexal region. Electronically Signed: By: Bary RichardStan  Maynard M.D. On: 05/04/2019 17:42     Assessment and Plan  Miscarriage  - Dx made with U/S showing no IUGS as previously seen on 04/28/19 - Plan weekly HCG starting 05/11/2019 @ 0830 - Anticipatory guidance for weekly HCG until down to 0 - Advised that will need to see provider in 2 wks - Patient verbalized an understanding of the plan of care and  agrees.    Cynthia Moraolitta Delenn Ahn, MSN, CNM 05/04/2019, 4:12 PM

## 2019-05-04 NOTE — Telephone Encounter (Signed)
Cynthia Valenzuela called and left a voicemail this am at 45;23 that she has been going to [facility] for [place] . States she is pregnant but not sure how far. States she had heavy bleeding last night and wants to know what should she do?

## 2019-05-04 NOTE — MAU Note (Signed)
Started bleeding last night around 0200, very heavily, thinks she may have miscarried, is still bleeding. Is cramping. LMP in April, had [place] 7/21: gest sac, no yolk sac or FP

## 2019-05-05 NOTE — Telephone Encounter (Signed)
Attempted to call pt back with no answer. Pt was seen in MAU yesterday and noted to have a miscarriage. LM for pt to call the office if she has further questions.

## 2019-05-11 ENCOUNTER — Other Ambulatory Visit: Payer: Medicaid Other

## 2019-05-11 ENCOUNTER — Other Ambulatory Visit: Payer: Self-pay

## 2019-05-11 DIAGNOSIS — O039 Complete or unspecified spontaneous abortion without complication: Secondary | ICD-10-CM

## 2019-05-12 LAB — BETA HCG QUANT (REF LAB): hCG Quant: 9 m[IU]/mL

## 2019-05-13 ENCOUNTER — Ambulatory Visit: Payer: Medicaid Other

## 2019-05-13 ENCOUNTER — Other Ambulatory Visit: Payer: Self-pay | Admitting: Emergency Medicine

## 2019-05-13 ENCOUNTER — Ambulatory Visit (HOSPITAL_COMMUNITY): Payer: Medicaid Other

## 2019-05-13 DIAGNOSIS — O039 Complete or unspecified spontaneous abortion without complication: Secondary | ICD-10-CM

## 2019-05-18 ENCOUNTER — Other Ambulatory Visit: Payer: Medicaid Other

## 2019-06-01 ENCOUNTER — Encounter: Payer: Self-pay | Admitting: Family Medicine

## 2019-06-01 ENCOUNTER — Ambulatory Visit: Payer: Medicaid Other | Admitting: Obstetrics and Gynecology

## 2019-08-09 DIAGNOSIS — U071 COVID-19: Secondary | ICD-10-CM

## 2019-08-09 HISTORY — DX: COVID-19: U07.1

## 2019-09-07 ENCOUNTER — Other Ambulatory Visit: Payer: Self-pay

## 2019-09-07 DIAGNOSIS — Z20822 Contact with and (suspected) exposure to covid-19: Secondary | ICD-10-CM

## 2019-09-08 LAB — NOVEL CORONAVIRUS, NAA: SARS-CoV-2, NAA: DETECTED — AB

## 2019-09-09 ENCOUNTER — Encounter: Payer: Self-pay | Admitting: Medical

## 2019-09-09 ENCOUNTER — Telehealth: Payer: Self-pay | Admitting: General Practice

## 2019-09-09 ENCOUNTER — Telehealth: Payer: Self-pay | Admitting: Unknown Physician Specialty

## 2019-09-09 DIAGNOSIS — U071 COVID-19: Secondary | ICD-10-CM | POA: Insufficient documentation

## 2019-09-09 NOTE — Telephone Encounter (Signed)
Patient is calling to get covid test results. NT line was busy. Call back [phone]

## 2019-09-09 NOTE — Telephone Encounter (Signed)
Patient has been advised on results

## 2019-09-09 NOTE — Telephone Encounter (Signed)
Discussed with patient about Covid symptoms and the use of bamlanivimab, a monoclonal antibody infusion for those with mild to moderate Covid symptoms and at a high risk of hospitalization.    Pt is qualified for this infusion at the [facility] infusion center due to obesity, without current treatment, and a  member of an at-risk group.    She is currently not interested in receiving the infusion.

## 2020-02-08 ENCOUNTER — Ambulatory Visit: Payer: Medicaid Other | Attending: Internal Medicine

## 2020-02-08 DIAGNOSIS — Z20822 Contact with and (suspected) exposure to covid-19: Secondary | ICD-10-CM

## 2020-02-10 LAB — SARS-COV-2, NAA 2 DAY TAT

## 2020-02-10 LAB — NOVEL CORONAVIRUS, NAA: SARS-CoV-2, NAA: NOT DETECTED

## 2020-10-08 NOTE — L&D Delivery Note (Signed)
OB/GYN Faculty Practice Delivery Note  Cynthia Valenzuela is a 25 y.o. G7P0150 s/p VD at [redacted]w[redacted]d. She was admitted for IOL due to initially gHTN, later developed pre-e with severe features.   ROM: 3h 70m with clear fluid GBS Status:  Positive/-- (08/17 1123) Maximum Maternal Temperature: 98.66F  Labor Progress: Initial SVE: closed. She then progressed to complete with assistance of cytotec, FB, and pit.   Delivery Date/Time: 8/26 0538  Delivery: Called to room and patient was complete and pushing. Head delivered left OA. No nuchal cord present. Shoulder and body delivered in usual fashion. Infant with spontaneous cry, placed on mother's abdomen, dried and stimulated. Cord clamped x 2 after 1-minute delay, and cut by father. Cord blood drawn. Placenta delivered spontaneously with gentle cord traction. Fundus firm with massage and Pitocin. Labia, perineum, vagina, and cervix inspected inspected with 2nd degree perineal laceration repaired in usual fashion with 3-0 vicryl rapide.  Baby Weight: pending  Placenta: Sent to L&D Complications: None Lacerations: 2nd degree, see above  EBL: 88 mL Analgesia: local lidocaine for laceration repair   Infant:  APGAR (1 MIN): 8   APGAR (5 MINS): 9   APGAR (10 MINS):     Leticia Penna, DO  OB Family Medicine Fellow, [facility] for [facility], [facility] 06/02/2021, 6:33 AM

## 2020-11-10 ENCOUNTER — Inpatient Hospital Stay (HOSPITAL_COMMUNITY)
Admission: AD | Admit: 2020-11-10 | Discharge: 2020-11-10 | Disposition: A | Discharge status: Home / Self Care | Payer: Medicaid Other | Attending: Obstetrics and Gynecology | Admitting: Obstetrics and Gynecology

## 2020-11-10 ENCOUNTER — Inpatient Hospital Stay (HOSPITAL_COMMUNITY): Payer: Medicaid Other

## 2020-11-10 ENCOUNTER — Encounter (HOSPITAL_COMMUNITY): Payer: Self-pay | Admitting: Obstetrics and Gynecology

## 2020-11-10 ENCOUNTER — Other Ambulatory Visit: Payer: Self-pay

## 2020-11-10 DIAGNOSIS — O09291 Supervision of pregnancy with other poor reproductive or obstetric history, first trimester: Secondary | ICD-10-CM

## 2020-11-10 DIAGNOSIS — O26851 Spotting complicating pregnancy, first trimester: Secondary | ICD-10-CM | POA: Diagnosis present

## 2020-11-10 DIAGNOSIS — O99211 Obesity complicating pregnancy, first trimester: Secondary | ICD-10-CM | POA: Diagnosis not present

## 2020-11-10 DIAGNOSIS — O3431 Maternal care for cervical incompetence, first trimester: Secondary | ICD-10-CM | POA: Diagnosis not present

## 2020-11-10 DIAGNOSIS — O99331 Smoking (tobacco) complicating pregnancy, first trimester: Secondary | ICD-10-CM | POA: Diagnosis not present

## 2020-11-10 DIAGNOSIS — Z3A08 8 weeks gestation of pregnancy: Secondary | ICD-10-CM | POA: Insufficient documentation

## 2020-11-10 DIAGNOSIS — O3680X0 Pregnancy with inconclusive fetal viability, not applicable or unspecified: Secondary | ICD-10-CM

## 2020-11-10 DIAGNOSIS — O209 Hemorrhage in early pregnancy, unspecified: Secondary | ICD-10-CM

## 2020-11-10 DIAGNOSIS — Z3490 Encounter for supervision of normal pregnancy, unspecified, unspecified trimester: Secondary | ICD-10-CM

## 2020-11-10 LAB — CBC
HCT: 37.2 % (ref 36.0–46.0)
Hemoglobin: 11.9 g/dL — ABNORMAL LOW (ref 12.0–15.0)
MCH: 27.9 pg (ref 26.0–34.0)
MCHC: 32 g/dL (ref 30.0–36.0)
MCV: 87.1 fL (ref 80.0–100.0)
Platelets: 383 10*3/uL (ref 150–400)
RBC: 4.27 MIL/uL (ref 3.87–5.11)
RDW: 14.5 % (ref 11.5–15.5)
WBC: 8.7 10*3/uL (ref 4.0–10.5)
nRBC: 0 % (ref 0.0–0.2)

## 2020-11-10 LAB — URINALYSIS, ROUTINE W REFLEX MICROSCOPIC
Bilirubin Urine: NEGATIVE
Glucose, UA: NEGATIVE mg/dL
Hgb urine dipstick: NEGATIVE
Ketones, ur: NEGATIVE mg/dL
Leukocytes,Ua: NEGATIVE
Nitrite: NEGATIVE
Protein, ur: NEGATIVE mg/dL
Specific Gravity, Urine: 1.023 (ref 1.005–1.030)
pH: 5 (ref 5.0–8.0)

## 2020-11-10 LAB — WET PREP, GENITAL
Clue Cells Wet Prep HPF POC: NONE SEEN
Sperm: NONE SEEN
Trich, Wet Prep: NONE SEEN
Yeast Wet Prep HPF POC: NONE SEEN

## 2020-11-10 LAB — POCT PREGNANCY, URINE: Preg Test, Ur: POSITIVE — AB

## 2020-11-10 LAB — HCG, QUANTITATIVE, PREGNANCY: hCG, Beta Chain, Quant, S: 44861 m[IU]/mL — ABNORMAL HIGH (ref <5)

## 2020-11-10 NOTE — Discharge Instructions (Signed)
Cervical Cerclage  Cervical cerclage is a surgical procedure to correct a cervix that opens up and thins out before pregnancy is at term. This is also called cervical insufficiency, or incompetent cervix. This condition can cause labor to start early (prematurely). In this procedure, a health care provider uses stitches (sutures) to sew the cervix shut during pregnancy. Your health care provider may use ultrasound to help guide the procedure and monitor your baby. Ultrasound uses sound waves to take images of your cervix and uterus. The health care provider will assess these images on a monitor in the operating room. Tell a health care provider about:  Any allergies you have, especially any allergies related to prescribed medicine, stitches, or anesthetic medicines.  All medicines you are taking, including vitamins, herbs, eye drops, creams, and over-the-counter medicines.  Your medical history, including prior labor deliveries.  Any problems you or family members have had with anesthetic medicines.  Any blood disorders you have.  Any surgeries you have had, including prior cervical stitching.  Any medical conditions you have or have had. What are the risks? Generally, this is a safe procedure. However, problems may occur, including:  Infection, such as infection of the cervix or the bag of fluid that surrounds the baby (amniotic sac).  Vaginal bleeding.  Allergic reactions to medicines.  Damage to nearby structures or organs, such as injury to the cervix or tearing of the amniotic sac.  Contractions that come too early, including going into early labor and delivery.  Cervical dystocia. This occurs when the cervix is unable to open normally during labor. What happens before the procedure? Staying hydrated Follow instructions from your health care provider about hydration, which may include:  Up to 2 hours before the procedure - you may continue to drink clear liquids, such as  water, clear fruit juice, black coffee, and plain tea.   Eating and drinking restrictions Follow instructions from your health care provider about eating and drinking, which may include:  8 hours before the procedure - stop eating heavy meals or foods, such as meat, fried foods, or fatty foods.  6 hours before the procedure - stop eating light meals or foods, such as toast or cereal.  6 hours before the procedure - stop drinking milk or drinks that contain milk.  2 hours before the procedure - stop drinking clear liquids. Medicines Ask your health care provider about:  Changing or stopping your regular medicines. This is especially important if you are taking diabetes medicines or blood thinners.  Taking medicines such as aspirin and ibuprofen. These medicines can thin your blood. Do not take these medicines unless your health care provider tells you to take them.  Taking over-the-counter medicines, vitamins, herbs, and supplements. Surgery safety Ask your health care provider:  How your surgery site will be marked.  What steps will be taken to help prevent infection. These may include: ? Removing hair at the surgery site. ? Washing skin with a germ-killing soap. ? Taking antibiotic medicine. General instructions  Do not put on any lotion, deodorant, or perfume.  Remove contact lenses and jewelry.  You may have an exam or testing, including blood or urine tests.  Plan to have someone take you home from the hospital or clinic.  If you will be going home right after the procedure, plan to have someone with you for 24 hours. What happens during the procedure?  An IV will be inserted into one of your veins.  You may be given  one or more of the following: ? A medicine to help you relax (sedative). ? A medicine to numb the area (local anesthetic). ? A medicine to make you fall asleep (general anesthetic). ? A medicine that is injected into your spine to numb the area below  and slightly above the injection site (spinal anesthetic).  A lubricated instrument (speculum) will be inserted into your vagina. The speculum will be widened to open the walls of your vagina so your surgeon can see your cervix.  Your cervix will be grasped and tightly sutured to close it. To do this, your surgeon will stitch a strong band of thread around your cervix, then the thread will be tightened to hold your cervix shut. The procedure may vary among health care providers and hospitals. What happens after the procedure?  Your blood pressure, heart rate, breathing rate, and blood oxygen level will be monitored until you leave the hospital or clinic.  You will be monitored for premature contractions.  You may have light bleeding and mild cramping.  You may have to wear compression stockings. These stockings help to prevent blood clots and reduce swelling in your legs.  If you were given a sedative during the procedure, it can affect you for several hours. Do not drive or operate machinery until your health care provider says that it is safe.  You may be put on bed rest.  You may be given an injection of a hormone (progesterone) to prevent premature contractions. Summary  Cervical cerclage is a surgical procedure in which stitches are used to sew the cervix shut during pregnancy.  Before the procedure, tell your health care provider about your medicines, or medical problems or blood disorders that you have.  This is a safe procedure. However, problems may occur, including infection, bleeding, or premature labor.  Follow all instructions about eating and drinking before the procedure. Plan to have someone drive you home from the hospital or clinic. This information is not intended to replace advice given to you by your health care provider. Make sure you discuss any questions you have with your health care provider. Document Revised: 07/21/2019 Document Reviewed: 05/20/2019 Elsevier  Patient Education  2021 Elsevier Inc. Obstetrics: Normal and Problem Pregnancies (7th ed., pp. 102-121). [place], [place]: Elsevier."> Textbook of Family Medicine (9th ed., pp. [phone]). [place], [place]: Else[employer].">  First Trimester of Pregnancy  The first trimester of pregnancy starts on the first day of your last menstrual period until the end of week 12. This is months 1 through 3 of pregnancy. A week after a sperm fertilizes an egg, the egg will implant into the wall of the uterus and begin to develop into a baby. By the end of 12 weeks, all the baby's organs will be formed and the baby will be 2-3 inches in size. Body changes during your first trimester Your body goes through many changes during pregnancy. The changes vary and generally return to normal after your baby is born. Physical changes  You may gain or lose weight.  Your breasts may begin to grow larger and become tender. The tissue that surrounds your nipples (areola) may become darker.  Dark spots or blotches (chloasma or mask of pregnancy) may develop on your face.  You may have changes in your hair. These can include thickening or thinning of your hair or changes in texture. Health changes  You may feel nauseous, and you may vomit.  You may have heartburn.  You may develop headaches.  You  may develop constipation.  Your gums may bleed and may be sensitive to brushing and flossing. Other changes  You may tire easily.  You may urinate more often.  Your menstrual periods will stop.  You may have a loss of appetite.  You may develop cravings for certain kinds of food.  You may have changes in your emotions from day to day.  You may have more vivid and strange dreams. Follow these instructions at home: Medicines  Follow your health care provider's instructions regarding medicine use. Specific medicines may be either safe or unsafe to take during pregnancy. Do not take any medicines unless told to  by your health care provider.  Take a prenatal vitamin that contains at least 600 micrograms (mcg) of folic acid. Eating and drinking  Eat a healthy diet that includes fresh fruits and vegetables, whole grains, good sources of protein such as meat, eggs, or tofu, and low-fat dairy products.  Avoid raw meat and unpasteurized juice, milk, and cheese. These carry germs that can harm you and your baby.  If you feel nauseous or you vomit: ? Eat 4 or 5 small meals a day instead of 3 large meals. ? Try eating a few soda crackers. ? Drink liquids between meals instead of during meals.  You may need to take these actions to prevent or treat constipation: ? Drink enough fluid to keep your urine pale yellow. ? Eat foods that are high in fiber, such as beans, whole grains, and fresh fruits and vegetables. ? Limit foods that are high in fat and processed sugars, such as fried or sweet foods. Activity  Exercise only as directed by your health care provider. Most people can continue their usual exercise routine during pregnancy. Try to exercise for 30 minutes at least 5 days a week.  Stop exercising if you develop pain or cramping in the lower abdomen or lower back.  Avoid exercising if it is very hot or humid or if you are at high altitude.  Avoid heavy lifting.  If you choose to, you may have sex unless your health care provider tells you not to. Relieving pain and discomfort  Wear a good support bra to relieve breast tenderness.  Rest with your legs elevated if you have leg cramps or low back pain.  If you develop bulging veins (varicose veins) in your legs: ? Wear support hose as told by your health care provider. ? Elevate your feet for 15 minutes, 3-4 times a day. ? Limit salt in your diet. Safety  Wear your seat belt at all times when driving or riding in a car.  Talk with your health care provider if someone is verbally or physically abusive to you.  Talk with your health care  provider if you are feeling sad or have thoughts of hurting yourself. Lifestyle  Do not use hot tubs, steam rooms, or saunas.  Do not douche. Do not use tampons or scented sanitary pads.  Do not use herbal remedies, alcohol, illegal drugs, or medicines that are not approved by your health care provider. Chemicals in these products can harm your baby.  Do not use any products that contain nicotine or tobacco, such as cigarettes, e-cigarettes, and chewing tobacco. If you need help quitting, ask your health care provider.  Avoid cat litter boxes and soil used by cats. These carry germs that can cause birth defects in the baby and possibly loss of the unborn baby (fetus) by miscarriage or stillbirth. General instructions  During  routine prenatal visits in the first trimester, your health care provider will do a physical exam, perform necessary tests, and ask you how things are going. Keep all follow-up visits. This is important.  Ask for help if you have counseling or nutritional needs during pregnancy. Your health care provider can offer advice or refer you to specialists for help with various needs.  Schedule a dentist appointment. At home, brush your teeth with a soft toothbrush. Floss gently.  Write down your questions. Take them to your prenatal visits. Where to find more information  American Pregnancy Association: americanpregnancy.org  [employer] of Obstetricians and Gynecologists: https://www.todd-brady.net/  Office on [employer] Health: MightyReward.co.nz Contact a health care provider if you have:  Dizziness.  A fever.  Mild pelvic cramps, pelvic pressure, or nagging pain in the abdominal area.  Nausea, vomiting, or diarrhea that lasts for 24 hours or longer.  A bad-smelling vaginal discharge.  Pain when you urinate.  Known exposure to a contagious illness, such as chickenpox, measles, Zika virus, HIV, or hepatitis. Get help right away if you  have:  Spotting or bleeding from your vagina.  Severe abdominal cramping or pain.  Shortness of breath or chest pain.  Any kind of trauma, such as from a fall or a car crash.  New or increased pain, swelling, or redness in an arm or leg. Summary  The first trimester of pregnancy starts on the first day of your last menstrual period until the end of week 12 (months 1 through 3).  Eating 4 or 5 small meals a day rather than 3 large meals may help to relieve nausea and vomiting.  Do not use any products that contain nicotine or tobacco, such as cigarettes, e-cigarettes, and chewing tobacco. If you need help quitting, ask your health care provider.  Keep all follow-up visits. This is important. This information is not intended to replace advice given to you by your health care provider. Make sure you discuss any questions you have with your health care provider. Document Revised: 03/02/2020 Document Reviewed: 01/07/2020 Elsevier Patient Education  2021 [employer].

## 2020-11-10 NOTE — MAU Provider Note (Signed)
Chief Complaint: Vaginal Bleeding   Event Date/Time   First Provider Initiated Contact with Patient 11/10/20 2027        SUBJECTIVE HPI: Cynthia Valenzuela is a 25 y.o. L4T6256 at Unknown by LMP who presents to maternity admissions reporting intermittent spotting and cramping.  Has not been seen yet for this pregnancy. She denies vaginal itching/burning, urinary symptoms, h/a, dizziness, n/v, or fever/chills.    History is remarkable for 2 SABs and a 23 week loss due to incompetent cervix in 2017   Short cervix was discovered at her anatomy US and she had a cerclage but then labored and delivered,  Baby did not survive. .   States has had vaccine and booster  Vaginal Bleeding The patient's primary symptoms include pelvic pain (intermittent) and vaginal bleeding. The patient's pertinent negatives include no genital itching, genital lesions or genital odor. This is a new problem. The current episode started in the past 7 days. The problem occurs intermittently. The problem has been unchanged. She is pregnant. Pertinent negatives include no abdominal pain (cramping resolved), chills, constipation, diarrhea, dysuria, fever, frequency, nausea or vomiting. The vaginal discharge was bloody. The vaginal bleeding is spotting. She has not been passing clots. She has not been passing tissue. Nothing aggravates the symptoms. She has tried nothing for the symptoms.   RN Note: PT SAYS LMP WAS 12-1. TOOK HPT ON 1-20 AND 1-31- BOTH POSITIVE.   SAYS HAS HAD OCC SPOTTING WHEN SHE WIPES- ON SAT AND SUN. SAYS HAD CRAMPS ON 1-8- NO MEDS  Past Medical History:  Diagnosis Date  . Headache   . Infection    UTI  . Obesities, morbid (HCC)   . Preterm labor    Past Surgical History:  Procedure Laterality Date  . CERVICAL CERCLAGE    . WISDOM TOOTH EXTRACTION     Social History   Socioeconomic History  . Marital status: Married    Spouse name: Not on file  . Number of children: Not on file  . Years of  education: Not on file  . Highest education level: Not on file  Occupational History  . Not on file  Tobacco Use  . Smoking status: Light Tobacco Smoker    Types: Cigarettes, E-cigarettes    Last attempt to quit: 09/30/2015    Years since quitting: 5.1  . Smokeless tobacco: Never Used  . Tobacco comment: vapes  Vaping Use  . Vaping Use: Some days  Substance and Sexual Activity  . Alcohol use: Yes  . Drug use: No    Comment: 04/22/2015 quit using marijuana   . Sexual activity: Yes    Birth control/protection: None  Other Topics Concern  . Not on file  Social History Narrative   ** Merged History Encounter **       Social Determinants of Health   Financial Resource Strain: Not on file  Food Insecurity: Not on file  Transportation Needs: Not on file  Physical Activity: Not on file  Stress: Not on file  Social Connections: Not on file  Intimate Partner Violence: Not on file   No current facility-administered medications on file prior to encounter.   Current Outpatient Medications on File Prior to Encounter  Medication Sig Dispense Refill  . ibuprofen (ADVIL) 600 MG tablet Take 1 tablet (600 mg total) by mouth every 6 (six) hours as needed. 30 tablet 0  . oxyCODONE-acetaminophen (PERCOCET/ROXICET) 5-325 MG tablet Take 1-2 tablets by mouth every 6 (six) hours as needed for moderate pain. 10  tablet 0   No Known Allergies  I have reviewed patient's Past Medical Hx, Surgical Hx, Family Hx, Social Hx, medications and allergies.   ROS:  Review of Systems  Constitutional: Negative for chills and fever.  Gastrointestinal: Negative for abdominal pain (cramping resolved), constipation, diarrhea, nausea and vomiting.  Genitourinary: Positive for pelvic pain (intermittent) and vaginal bleeding. Negative for dysuria and frequency.   Review of Systems  Other systems negative   Physical Exam  Physical Exam Patient Vitals for the past 24 hrs:  BP Temp Temp src Pulse Resp Height  Weight  11/10/20 1939 (!) 113/59 99 F (37.2 C) Oral 86 18 5\' 5"  (1.651 m) (!) 168.2 kg   Constitutional: Well-developed, well-nourished female in no acute distress.  Cardiovascular: normal rate Respiratory: normal effort GI: Abd soft, non-tender. Pos BS x 4 MS: Extremities nontender, no edema, normal ROM Neurologic: Alert and oriented x 4.  GU: Neg CVAT.  PELVIC EXAM: scant white creamy discharge, vaginal walls and external genitalia normal   LAB RESULTS Results for orders placed or performed during the hospital encounter of 11/10/20 (from the past 24 hour(s))  Urinalysis, Routine w reflex microscopic Urine, Clean Catch     Status: Abnormal   Collection Time: 11/10/20  7:46 PM  Result Value Ref Range   Color, Urine YELLOW YELLOW   APPearance HAZY (A) CLEAR   Specific Gravity, Urine 1.023 1.005 - 1.030   pH 5.0 5.0 - 8.0   Glucose, UA NEGATIVE NEGATIVE mg/dL   Hgb urine dipstick NEGATIVE NEGATIVE   Bilirubin Urine NEGATIVE NEGATIVE   Ketones, ur NEGATIVE NEGATIVE mg/dL   Protein, ur NEGATIVE NEGATIVE mg/dL   Nitrite NEGATIVE NEGATIVE   Leukocytes,Ua NEGATIVE NEGATIVE  Pregnancy, urine POC     Status: Abnormal   Collection Time: 11/10/20  7:52 PM  Result Value Ref Range   Preg Test, Ur POSITIVE (A) NEGATIVE  Wet prep, genital     Status: Abnormal   Collection Time: 11/10/20  8:54 PM  Result Value Ref Range   Yeast Wet Prep HPF POC NONE SEEN NONE SEEN   Trich, Wet Prep NONE SEEN NONE SEEN   Clue Cells Wet Prep HPF POC NONE SEEN NONE SEEN   WBC, Wet Prep HPF POC MANY (A) NONE SEEN   Sperm NONE SEEN   CBC     Status: Abnormal   Collection Time: 11/10/20  9:12 PM  Result Value Ref Range   WBC 8.7 4.0 - 10.5 K/uL   RBC 4.27 3.87 - 5.11 MIL/uL   Hemoglobin 11.9 (L) 12.0 - 15.0 g/dL   HCT 01/08/21 23.5 - 36.1 %   MCV 87.1 80.0 - 100.0 fL   MCH 27.9 26.0 - 34.0 pg   MCHC 32.0 30.0 - 36.0 g/dL   RDW 44.3 15.4 - 00.8 %   Platelets 383 150 - 400 K/uL   nRBC 0.0 0.0 - 0.2 %   hCG, quantitative, pregnancy     Status: Abnormal   Collection Time: 11/10/20  9:12 PM  Result Value Ref Range   hCG, Beta Chain, Quant, S 44,861 (H) <5 mIU/mL    IMAGING 01/08/21 OB LESS THAN 14 WEEKS WITH OB TRANSVAGINAL  Result Date: 11/10/2020 CLINICAL DATA:  Vaginal bleeding EXAM: OBSTETRIC <14 WK 01/08/2021 AND TRANSVAGINAL OB US TECHNIQUE: Both transabdominal and transvaginal ultrasound examinations were performed for complete evaluation of the gestation as well as the maternal uterus, adnexal regions, and pelvic cul-de-sac. Transvaginal technique was performed to assess early pregnancy. COMPARISON:  None. FINDINGS: Intrauterine gestational sac: Present Yolk sac:  Present Embryo:  Present Cardiac Activity: Present Heart Rate: 171 bpm CRL:  16.7 mm mm   8 w   0 d                  [place] EDC: 06/22/2021 Subchorionic hemorrhage:  None visualized. Maternal uterus/adnexae: Ovaries are within normal limits. No free fluid is seen. IMPRESSION: Single live intrauterine gestation at 8 weeks. Electronically Signed   By: Alcide Clever M.D.   On: 11/10/2020 21:51     MAU Management/MDM: Ordered usual first trimester r/o ectopic labs.   Pelvic exam and cultures done Will check baseline Ultrasound to rule out ectopic.  This bleeding/pain can represent a normal pregnancy with bleeding, spontaneous abortion or even an ectopic which can be life-threatening.  The process as listed above helps to determine which of these is present.  Results reviewed. Patient thrilled to know baby is viable.  Worried about the future due to prior loss.  Discussed optimizing health by stopping smoking/vaping (she stated she was) and early care. Message sent to HR clinic to get early MD consult, and preparations for probable early cerclage.   ASSESSMENT Single intrauterine pregnancy at [redacted]w[redacted]d Intermittent spotting Intermittent mild cramping History of incompetent cervix Has had vaccine and booster. Did have mild case of Covid late  2020  PLAN Discharge home Stop smoking/vaping Message sent to get early appt for consultation re: cerclage and planning  Pt stable at time of discharge. Encouraged to return here if she develops worsening of symptoms, increase in pain, fever, or other concerning symptoms.    Wynelle Bourgeois CNM, MSN Certified Nurse-Midwife 11/10/2020  8:27 PM

## 2020-11-10 NOTE — MAU Note (Signed)
PT SAYS LMP WAS 12-1. TOOK HPT ON 1-20 AND 1-31- BOTH POSITIVE.   SAYS HAS HAD OCC SPOTTING WHEN SHE WIPES- ON SAT AND SUN. SAYS HAD CRAMPS ON 1-8- NO MEDS.

## 2020-11-11 LAB — GC/CHLAMYDIA PROBE AMP (~~LOC~~) NOT AT ARMC
Chlamydia: NEGATIVE
Comment: NEGATIVE
Comment: NORMAL
Neisseria Gonorrhea: NEGATIVE

## 2020-11-21 ENCOUNTER — Telehealth (INDEPENDENT_AMBULATORY_CARE_PROVIDER_SITE_OTHER): Payer: Medicaid Other | Admitting: *Deleted

## 2020-11-21 ENCOUNTER — Other Ambulatory Visit: Payer: Self-pay

## 2020-11-21 DIAGNOSIS — O99210 Obesity complicating pregnancy, unspecified trimester: Secondary | ICD-10-CM | POA: Insufficient documentation

## 2020-11-21 DIAGNOSIS — O209 Hemorrhage in early pregnancy, unspecified: Secondary | ICD-10-CM | POA: Insufficient documentation

## 2020-11-21 DIAGNOSIS — Z3A09 9 weeks gestation of pregnancy: Secondary | ICD-10-CM

## 2020-11-21 DIAGNOSIS — O09299 Supervision of pregnancy with other poor reproductive or obstetric history, unspecified trimester: Secondary | ICD-10-CM | POA: Insufficient documentation

## 2020-11-21 DIAGNOSIS — O0990 Supervision of high risk pregnancy, unspecified, unspecified trimester: Secondary | ICD-10-CM | POA: Insufficient documentation

## 2020-11-21 DIAGNOSIS — O0941 Supervision of pregnancy with grand multiparity, first trimester: Secondary | ICD-10-CM

## 2020-11-21 DIAGNOSIS — O09899 Supervision of other high risk pregnancies, unspecified trimester: Secondary | ICD-10-CM | POA: Insufficient documentation

## 2020-11-21 DIAGNOSIS — Z8616 Personal history of COVID-19: Secondary | ICD-10-CM | POA: Insufficient documentation

## 2020-11-21 NOTE — Progress Notes (Addendum)
New OB Intake  I connected with  Lind Guest on 11/21/20 at 9:10 by MyChart and verified that I am speaking with the correct person using two identifiers. Nurse is located at [facility] and pt is located at home. Addendum: 11/24/20 4:27 pm Visit was completed virtually thru MyChart.  Mateya Torti,RN  I discussed the limitations, risks, security and privacy concerns of performing an evaluation and management service by telephone and the availability of in person appointments. I also discussed with the patient that there may be a patient responsible charge related to this service. The patient expressed understanding and agreed to proceed.  I explained I am completing New OB Intake today. We discussed her EDD of 06/22/21 that is based on early [place] per protocol.. Pt is G7/P0150. I reviewed her allergies, medications, Medical/Surgical/OB history, and appropriate screenings. I informed her of [facility] services. Based on history, this is a/an complicated  pregnancy.  Patient Active Problem List   Diagnosis Date Noted  . Supervision of high risk pregnancy, antepartum 11/21/2020  . History of incompetent cervix, currently pregnant 11/21/2020  . Obesity in pregnancy 11/21/2020  . Bleeding in early pregnancy 11/21/2020  . History of preterm delivery, currently pregnant 11/21/2020  . History of COVID-19 11/21/2020  . COVID-19 09/09/2019  . Incompetent cervix 01/25/2016  . Frequent headaches 12/08/2015     Concerns addressed today  Delivery Plans:  Plans to deliver at [facility].   MyChart/Babyscripts MyChart access verified. I explained pt will have some visits in office and some virtually. Babyscripts instructions given. Account successfully created and app downloaded.  Blood Pressure Cuff Does not have cuff. Is in process to apply for pregnancy medicaid because has family planning only.  Explained after first prenatal appt pt will check weekly and document in Babyscripts.  Anatomy US Explained first  scheduled [place] will be around 19 weeks. Anatomy US will be scheduled  and patient will be notified by MyChart.   Labs Discussed Avelina Laine genetic screening with patient. Would like both Panorama and Horizon drawn at new OB visit. Routine prenatal labs needed.  Covid Vaccine Patient has had covid vaccine. Will bring covid vaccination card.   [facility] Referral Patient is interested in referral to [facility].    First visit review I reviewed new OB appt with pt. I explained she will have a pelvic exam, ob bloodwork with genetic screening, and PAP smear. Explained pt will be seen by Dr. Debroah Loop at first visit; encounter routed to appropriate provider.   Para Cossey,RN 11/21/2020  10:04 AM

## 2020-11-21 NOTE — Patient Instructions (Signed)
-   At our [facility], we work as an integrated team, providing care to address both physical and emotional health. Your medical provider may refer you to see our Behavioral Health Clinician (BHC) on the same day you see your medical provider, as availability permits; often scheduled virtually at your convenience.  Our BHC is available to all patients, visits generally last between 20-30 minutes, but can be longer or shorter, depending on patient need. The BHC offers help with stress management, coping with symptoms of depression and anxiety, major life changes , sleep issues, changing risky behavior, grief and loss, life stress, working on personal life goals, and  behavioral health issues, as these all affect your overall health and wellness.  The BHC is NOT available for the following: FMLA paperwork, court-ordered evaluations, specialty assessments (custody or disability), letters to employers, or obtaining certification for an emotional support animal. The BHC does not provide long-term therapy. You have the right to refuse integrated behavioral health services, or to reschedule to see the BHC at a later date.  Confidentiality exception: If it is suspected that a child or disabled adult is being abused or neglected, we are required by law to report that to either Child Protective Services or Adult Protective Services.  If you have a diagnosis of Bipolar affective disorder, Schizophrenia, or recurrent Major depressive disorder, we will recommend that you establish care with a psychiatrist, as these are lifelong, chronic conditions, and we want your overall emotional health and medications to be more closely monitored. If you anticipate needing extended maternity leave due to mental health issues postpartum, it it recommended you inform your medical provider, so we can put in a referral to a  psychiatrist as soon as possible. The BHC is unable to recommend an extended maternity leave for mental  health issues. Your medical provider or BHC may refer you to a therapist for ongoing, traditional therapy, or to a psychiatrist, for medication management, if it would benefit your overall health. Depending on your insurance, you may have a copay to see the BHC. If you are uninsured, it is recommended that you apply for financial assistance. (Forms may be requested at the front desk for in-person visits, via MyChart, or request a form during a virtual visit).  If you see the BHC more than 6 times, you will have to complete a comprehensive clinical assessment interview with the BHC to resume integrated services.  For virtual visits with the BHC, you must be physically in the state of [place] at the time of the visit. For example, if you live in [place], you will have to do an in-person visit with the BHC, and your out-of-state insurance may not cover behavioral health services in [place]. f you are going out of the state or country for any reason, the BHC may see you virtually when you return to [place], but not while you are physically outside of [place].   

## 2020-11-28 ENCOUNTER — Ambulatory Visit (INDEPENDENT_AMBULATORY_CARE_PROVIDER_SITE_OTHER): Payer: Medicaid Other | Admitting: Obstetrics & Gynecology

## 2020-11-28 ENCOUNTER — Other Ambulatory Visit (HOSPITAL_COMMUNITY)
Admission: RE | Admit: 2020-11-28 | Discharge: 2020-11-28 | Disposition: A | Discharge status: Home / Self Care | Payer: Medicaid Other | Source: Ambulatory Visit | Attending: Obstetrics & Gynecology | Admitting: Obstetrics & Gynecology

## 2020-11-28 ENCOUNTER — Encounter: Payer: Self-pay | Admitting: Obstetrics & Gynecology

## 2020-11-28 ENCOUNTER — Other Ambulatory Visit: Payer: Self-pay | Admitting: Obstetrics and Gynecology

## 2020-11-28 ENCOUNTER — Other Ambulatory Visit: Payer: Self-pay

## 2020-11-28 VITALS — BP 131/67 | HR 82 | Wt 361.1 lb

## 2020-11-28 DIAGNOSIS — O0990 Supervision of high risk pregnancy, unspecified, unspecified trimester: Secondary | ICD-10-CM | POA: Diagnosis not present

## 2020-11-28 DIAGNOSIS — O09299 Supervision of pregnancy with other poor reproductive or obstetric history, unspecified trimester: Secondary | ICD-10-CM | POA: Insufficient documentation

## 2020-11-28 DIAGNOSIS — O99210 Obesity complicating pregnancy, unspecified trimester: Secondary | ICD-10-CM

## 2020-11-28 DIAGNOSIS — O09899 Supervision of other high risk pregnancies, unspecified trimester: Secondary | ICD-10-CM

## 2020-11-28 DIAGNOSIS — O209 Hemorrhage in early pregnancy, unspecified: Secondary | ICD-10-CM | POA: Insufficient documentation

## 2020-11-28 MED ORDER — BLOOD PRESSURE KIT DEVI: 1.0000 | 0 refills | Status: DC | PRN

## 2020-11-28 NOTE — Patient Instructions (Signed)
http://www.merckmanuals.com/professional/gynecology-and-obstetrics/abnormalities-of-pregnancy/cervical-insufficiency"> https://www.marchofdimes.org/complications/cervical-insufficiency-and-short-cervix.aspx">  Cervical Insufficiency Cervical insufficiency is a condition that happens when the cervix starts to open (dilate) and thin (efface) before the pregnancy is at term and before labor starts. This is also called incompetent cervix. It can happen during the second or third trimester when the fetus starts putting pressure on the cervix. Cervical insufficiency can lead to:  Loss of the baby (miscarriage).  Early breakage of the sac that holds the baby (amniotic sac) before you are at full term. This is also called preterm prelabor rupture of the membranes (PPROM).  The baby being born early (preterm). What are the causes? The cause of this condition is not known. However, it may be caused by abnormalities in the cervix, inflammation, or infection. What increases the risk? The following factors may make you more likely to develop this condition:  Having a shorter cervix than normal.  Having a cervix that was damaged or injured during a past pregnancy or surgery.  Being born with a problem with your cervix (cervical defect).  Having had a procedure done on the cervix, such as a cervical biopsy.  Having had previous cervical insufficiency or PPROM.  Having ended several pregnancies through abortion. What are the signs or symptoms? Symptoms of this condition can vary. Sometimes, there are no symptoms for this condition. Other times, there are mild symptoms that start between 14 weeks and 20 weeks of the pregnancy. The symptoms may last several days or weeks. These symptoms include:  Light spotting or bleeding from the vagina.  Pelvic pressure.  A change in vaginal discharge, such as changes from clear, white, or light yellow to pink or tan.  Back pain.  Pain or cramping in the  abdomen. How is this diagnosed? This condition may be diagnosed based on your symptoms and medical history, including:  Problems during past pregnancies, such as having miscarriages.  Procedures performed on your cervix.  History of cervical insufficiency. During the second trimester, cervical insufficiency may be diagnosed based on:  An ultrasound done with a probe inserted into your vagina (transvaginal ultrasound).  A pelvic exam.  Tests of fluid in the amniotic sac. This is done to rule out infection. How is this treated? Treatment may reduce the risk of problems for you and your baby. This condition may be managed by:  Limiting physical activity. This may be done at home or in the hospital.  Having pelvic rest. This means that you should not have sexual intercourse or place anything in your vagina.  Having a procedure to sew the cervix closed and prevent it from opening too early (cerclage). The stitches (sutures) are removed between weeks 36 and 38 to avoid problems during labor. Cerclage may be recommended if: ? You have a history of miscarriages or preterm births without a known cause. ? You have a short cervix. A short cervix is identified by ultrasound. ? Your cervix has dilated before 24 weeks of pregnancy. Follow these instructions at home: Activity  Rest and lessen activity as told by your health care provider.  Ask your health care provider what activities are safe for you.  Do not have sex, use tampons, douche, or place anything inside your vagina until your health care provider says that this is okay. General instructions  Take over-the-counter and prescription medicines only as told by your health care provider.  Keep all follow-up visits and prenatal visits as told by your health care provider. This is important. Get help right away if:  You have  vaginal bleeding, even if it is a small amount or even if it is painless.  You have pain in your abdomen or  your lower back.  You feel increased pressure in your pelvis.  You have vaginal discharge that changes from clear, white, or light yellow to pink or tan.  You have a fever.  You have severe nausea or vomiting. Summary  Cervical insufficiency is when the cervix starts to open (dilate) and thin (efface) before the pregnancy is at term and before labor starts.  Symptoms of this condition can vary from no symptoms to mild symptoms that start between weeks 14 and 20 of pregnancy. The symptoms may last several days or weeks.  This condition may be managed by limiting physical activity, having pelvic rest, or having a procedure to sew the cervix closed and prevent it from opening too early (cerclage).  Do not have sex, use tampons, douche, or place anything inside your vagina until your health care provider says that this is okay. This information is not intended to replace advice given to you by your health care provider. Make sure you discuss any questions you have with your health care provider. Document Revised: 09/19/2019 Document Reviewed: 09/19/2019 Elsevier Patient Education  2021 [employer].

## 2020-11-28 NOTE — Progress Notes (Signed)
Subjective:    Cynthia Valenzuela is a [id number] [redacted]w[redacted]d being seen today for her first obstetrical visit.  Her obstetrical history is significant for incompetent cervix. Patient does intend to breast feed. Pregnancy history fully reviewed.  Patient reports no complaints.  Vitals:   11/28/20 1028  BP: 131/67  Pulse: 82  Weight: (!) 361 lb 1.6 oz (163.8 kg)    HISTORY: OB History  Gravida Para Term Preterm AB Living  7 1 0 1 5 0  SAB IAB Ectopic Multiple Live Births  4 1 0 0 1    # Outcome Date GA Lbr Len/2nd Weight Sex Delivery Anes PTL Lv  7 Current           6 SAB 05/03/20 [redacted]w[redacted]d   U SAB     5 SAB 10/08/18 [redacted]w[redacted]d   U SAB  N   4 SAB 01/2018 [redacted]w[redacted]d         3 Preterm 02/09/16 [redacted]w[redacted]d 01:35 / 00:03 1 lb 6.6 oz (0.64 kg) M Vag-Spont None  DEC     Birth Comments: extreme prematurity     Complications: Cervix incompetence  2 SAB 04/2015 [redacted]w[redacted]d         1 IAB 11/2014            Obstetric Comments  Preterm child deceased due to loss from incompetent cervix (Had cerclage)   Past Medical History:  Diagnosis Date  . Headache   . Infection    UTI  . Obesities, morbid (HCC)   . Preterm labor    Past Surgical History:  Procedure Laterality Date  . CERVICAL CERCLAGE    . WISDOM TOOTH EXTRACTION     Family History  Problem Relation Age of Onset  . Alcohol abuse Father   . Diabetes Mother   . Diabetes Sister      Exam    Uterus:     Pelvic Exam:    Perineum: No Hemorrhoids   Vulva: normal   Vagina:  normal mucosa   pH:     Cervix: no lesions   Adnexa: normal adnexa   Bony Pelvis: average  System: Breast:  normal appearance, no masses or tenderness   Skin: normal coloration and turgor, no rashes Evidence of multiple skin abscesses healed on inner thighs c/w possible HS   Neurologic: oriented, normal mood   Extremities: normal strength, tone, and muscle mass   HEENT PERRLA, sclera clear, anicteric and thyroid without masses   Mouth/Teeth mucous membranes moist, pharynx normal  without lesions and dental hygiene good   Neck supple and no masses   Cardiovascular: regular rate and rhythm, no murmurs or gallops   Respiratory:  appears well, vitals normal, no respiratory distress, acyanotic, normal RR, neck free of mass or lymphadenopathy, chest clear, no wheezing, crepitations, rhonchi, normal symmetric air entry   Abdomen: obese   Urinary: urethral meatus normal      Assessment:    Pregnancy: [name] Patient Active Problem List   Diagnosis Date Noted  . Supervision of high risk pregnancy, antepartum 11/21/2020  . History of incompetent cervix, currently pregnant 11/21/2020  . Obesity in pregnancy 11/21/2020  . Bleeding in early pregnancy 11/21/2020  . History of preterm delivery, currently pregnant 11/21/2020  . History of COVID-19 11/21/2020  . COVID-19 09/09/2019  . Incompetent cervix 01/25/2016  . Frequent headaches 12/08/2015        Plan:     Initial labs drawn. Prenatal vitamins. Problem list reviewed and updated. Genetic Screening discussed  Ultrasound discussed; fetal survey: ordered.  Follow up in 3 weeks. 50% of 30 min visit spent on counseling and coordination of care.  Needs cerclage early second trimester and requested scheduling in 4 weeks   Scheryl Darter 11/28/2020

## 2020-11-28 NOTE — Progress Notes (Signed)
Here for new OB, patient has downloaded BabyScripts

## 2020-11-29 LAB — CBC/D/PLT+RPR+RH+ABO+RUB AB...
Antibody Screen: NEGATIVE
Basophils Absolute: 0 10*3/uL (ref 0.0–0.2)
Basos: 0 %
EOS (ABSOLUTE): 0.1 10*3/uL (ref 0.0–0.4)
Eos: 1 %
HCV Ab: <0.1 s/co ratio (ref 0.0–0.9)
HIV Screen 4th Generation wRfx: NONREACTIVE
Hematocrit: 39.9 % (ref 34.0–46.6)
Hemoglobin: 12.8 g/dL (ref 11.1–15.9)
Hepatitis B Surface Ag: NEGATIVE
Immature Grans (Abs): 0 10*3/uL (ref 0.0–0.1)
Immature Granulocytes: 1 %
Lymphocytes Absolute: 2.3 10*3/uL (ref 0.7–3.1)
Lymphs: 31 %
MCH: 28.1 pg (ref 26.6–33.0)
MCHC: 32.1 g/dL (ref 31.5–35.7)
MCV: 88 fL (ref 79–97)
Monocytes Absolute: 0.5 10*3/uL (ref 0.1–0.9)
Monocytes: 6 %
Neutrophils Absolute: 4.5 10*3/uL (ref 1.4–7.0)
Neutrophils: 61 %
Platelets: 370 10*3/uL (ref 150–450)
RBC: 4.55 x10E6/uL (ref 3.77–5.28)
RDW: 13.4 % (ref 11.7–15.4)
RPR Ser Ql: NONREACTIVE
Rh Factor: POSITIVE
Rubella Antibodies, IGG: 1.76 index (ref >0.99)
WBC: 7.4 10*3/uL (ref 3.4–10.8)

## 2020-11-29 LAB — GC/CHLAMYDIA PROBE AMP (~~LOC~~) NOT AT ARMC
Chlamydia: NEGATIVE
Comment: NEGATIVE
Comment: NORMAL
Neisseria Gonorrhea: NEGATIVE

## 2020-11-29 LAB — HEMOGLOBIN A1C
Est. average glucose Bld gHb Est-mCnc: 137 mg/dL
Hgb A1c MFr Bld: 6.4 % — ABNORMAL HIGH (ref 4.8–5.6)

## 2020-11-29 LAB — HCV INTERPRETATION

## 2020-11-30 ENCOUNTER — Encounter: Payer: Self-pay | Admitting: *Deleted

## 2020-11-30 LAB — CYTOLOGY - PAP: Diagnosis: NEGATIVE

## 2020-12-13 ENCOUNTER — Telehealth (HOSPITAL_COMMUNITY): Payer: Self-pay | Admitting: *Deleted

## 2020-12-13 ENCOUNTER — Encounter (HOSPITAL_COMMUNITY): Payer: Self-pay | Admitting: *Deleted

## 2020-12-13 NOTE — Telephone Encounter (Signed)
Preadmission screen  

## 2020-12-14 ENCOUNTER — Encounter: Payer: Self-pay | Admitting: *Deleted

## 2020-12-15 ENCOUNTER — Telehealth (HOSPITAL_COMMUNITY): Payer: Self-pay | Admitting: *Deleted

## 2020-12-15 NOTE — Telephone Encounter (Signed)
Preadmission screen  

## 2020-12-19 ENCOUNTER — Encounter (HOSPITAL_COMMUNITY): Payer: Self-pay | Admitting: Obstetrics and Gynecology

## 2020-12-19 ENCOUNTER — Inpatient Hospital Stay (HOSPITAL_COMMUNITY)
Admission: AD | Admit: 2020-12-19 | Discharge: 2020-12-19 | Disposition: A | Discharge status: Home / Self Care | Payer: Medicaid Other | Attending: Obstetrics and Gynecology | Admitting: Obstetrics and Gynecology

## 2020-12-19 ENCOUNTER — Ambulatory Visit (INDEPENDENT_AMBULATORY_CARE_PROVIDER_SITE_OTHER): Payer: Medicaid Other | Admitting: Obstetrics and Gynecology

## 2020-12-19 ENCOUNTER — Other Ambulatory Visit: Payer: Self-pay

## 2020-12-19 ENCOUNTER — Inpatient Hospital Stay (HOSPITAL_COMMUNITY): Payer: Medicaid Other

## 2020-12-19 VITALS — BP 120/46 | HR 86 | Wt 361.0 lb

## 2020-12-19 DIAGNOSIS — Z3490 Encounter for supervision of normal pregnancy, unspecified, unspecified trimester: Secondary | ICD-10-CM

## 2020-12-19 DIAGNOSIS — O99210 Obesity complicating pregnancy, unspecified trimester: Secondary | ICD-10-CM

## 2020-12-19 DIAGNOSIS — Z8616 Personal history of COVID-19: Secondary | ICD-10-CM

## 2020-12-19 DIAGNOSIS — Z8759 Personal history of other complications of pregnancy, childbirth and the puerperium: Secondary | ICD-10-CM | POA: Insufficient documentation

## 2020-12-19 DIAGNOSIS — O26891 Other specified pregnancy related conditions, first trimester: Secondary | ICD-10-CM | POA: Diagnosis not present

## 2020-12-19 DIAGNOSIS — O09299 Supervision of pregnancy with other poor reproductive or obstetric history, unspecified trimester: Secondary | ICD-10-CM

## 2020-12-19 DIAGNOSIS — Z3A13 13 weeks gestation of pregnancy: Secondary | ICD-10-CM

## 2020-12-19 DIAGNOSIS — E669 Obesity, unspecified: Secondary | ICD-10-CM

## 2020-12-19 DIAGNOSIS — O09292 Supervision of pregnancy with other poor reproductive or obstetric history, second trimester: Secondary | ICD-10-CM | POA: Diagnosis not present

## 2020-12-19 DIAGNOSIS — O0990 Supervision of high risk pregnancy, unspecified, unspecified trimester: Secondary | ICD-10-CM

## 2020-12-19 DIAGNOSIS — O368310 Maternal care for abnormalities of the fetal heart rate or rhythm, first trimester, not applicable or unspecified: Secondary | ICD-10-CM | POA: Diagnosis not present

## 2020-12-19 DIAGNOSIS — Z87891 Personal history of nicotine dependence: Secondary | ICD-10-CM | POA: Diagnosis not present

## 2020-12-19 DIAGNOSIS — O26899 Other specified pregnancy related conditions, unspecified trimester: Secondary | ICD-10-CM

## 2020-12-19 DIAGNOSIS — N883 Incompetence of cervix uteri: Secondary | ICD-10-CM

## 2020-12-19 DIAGNOSIS — O209 Hemorrhage in early pregnancy, unspecified: Secondary | ICD-10-CM

## 2020-12-19 DIAGNOSIS — O09899 Supervision of other high risk pregnancies, unspecified trimester: Secondary | ICD-10-CM

## 2020-12-19 DIAGNOSIS — Z7982 Long term (current) use of aspirin: Secondary | ICD-10-CM | POA: Insufficient documentation

## 2020-12-19 DIAGNOSIS — Z6841 Body Mass Index (BMI) 40.0 and over, adult: Secondary | ICD-10-CM | POA: Insufficient documentation

## 2020-12-19 DIAGNOSIS — R109 Unspecified abdominal pain: Secondary | ICD-10-CM

## 2020-12-19 MED ORDER — ASPIRIN 81 MG PO CHEW: 81.0000 mg | CHEWABLE_TABLET | Freq: Every day | ORAL | 6 refills | Status: DC

## 2020-12-19 NOTE — Patient Instructions (Signed)
Cervical Cerclage  Cervical cerclage is a surgical procedure to correct a cervix that opens up and thins out before pregnancy is at term. This is also called cervical insufficiency, or incompetent cervix. This condition can cause labor to start early (prematurely). In this procedure, a health care provider uses stitches (sutures) to sew the cervix shut during pregnancy. Your health care provider may use ultrasound to help guide the procedure and monitor your baby. Ultrasound uses sound waves to take images of your cervix and uterus. The health care provider will assess these images on a monitor in the operating room. Tell a health care provider about:  Any allergies you have, especially any allergies related to prescribed medicine, stitches, or anesthetic medicines.  All medicines you are taking, including vitamins, herbs, eye drops, creams, and over-the-counter medicines.  Your medical history, including prior labor deliveries.  Any problems you or family members have had with anesthetic medicines.  Any blood disorders you have.  Any surgeries you have had, including prior cervical stitching.  Any medical conditions you have or have had. What are the risks? Generally, this is a safe procedure. However, problems may occur, including:  Infection, such as infection of the cervix or the bag of fluid that surrounds the baby (amniotic sac).  Vaginal bleeding.  Allergic reactions to medicines.  Damage to nearby structures or organs, such as injury to the cervix or tearing of the amniotic sac.  Contractions that come too early, including going into early labor and delivery.  Cervical dystocia. This occurs when the cervix is unable to open normally during labor. What happens before the procedure? Staying hydrated Follow instructions from your health care provider about hydration, which may include:  Up to 2 hours before the procedure - you may continue to drink clear liquids, such as  water, clear fruit juice, black coffee, and plain tea.   Eating and drinking restrictions Follow instructions from your health care provider about eating and drinking, which may include:  8 hours before the procedure - stop eating heavy meals or foods, such as meat, fried foods, or fatty foods.  6 hours before the procedure - stop eating light meals or foods, such as toast or cereal.  6 hours before the procedure - stop drinking milk or drinks that contain milk.  2 hours before the procedure - stop drinking clear liquids. Medicines Ask your health care provider about:  Changing or stopping your regular medicines. This is especially important if you are taking diabetes medicines or blood thinners.  Taking medicines such as aspirin and ibuprofen. These medicines can thin your blood. Do not take these medicines unless your health care provider tells you to take them.  Taking over-the-counter medicines, vitamins, herbs, and supplements. Surgery safety Ask your health care provider:  How your surgery site will be marked.  What steps will be taken to help prevent infection. These may include: ? Removing hair at the surgery site. ? Washing skin with a germ-killing soap. ? Taking antibiotic medicine. General instructions  Do not put on any lotion, deodorant, or perfume.  Remove contact lenses and jewelry.  You may have an exam or testing, including blood or urine tests.  Plan to have someone take you home from the hospital or clinic.  If you will be going home right after the procedure, plan to have someone with you for 24 hours. What happens during the procedure?  An IV will be inserted into one of your veins.  You may be given  one or more of the following: ? A medicine to help you relax (sedative). ? A medicine to numb the area (local anesthetic). ? A medicine to make you fall asleep (general anesthetic). ? A medicine that is injected into your spine to numb the area below  and slightly above the injection site (spinal anesthetic).  A lubricated instrument (speculum) will be inserted into your vagina. The speculum will be widened to open the walls of your vagina so your surgeon can see your cervix.  Your cervix will be grasped and tightly sutured to close it. To do this, your surgeon will stitch a strong band of thread around your cervix, then the thread will be tightened to hold your cervix shut. The procedure may vary among health care providers and hospitals. What happens after the procedure?  Your blood pressure, heart rate, breathing rate, and blood oxygen level will be monitored until you leave the hospital or clinic.  You will be monitored for premature contractions.  You may have light bleeding and mild cramping.  You may have to wear compression stockings. These stockings help to prevent blood clots and reduce swelling in your legs.  If you were given a sedative during the procedure, it can affect you for several hours. Do not drive or operate machinery until your health care provider says that it is safe.  You may be put on bed rest.  You may be given an injection of a hormone (progesterone) to prevent premature contractions. Summary  Cervical cerclage is a surgical procedure in which stitches are used to sew the cervix shut during pregnancy.  Before the procedure, tell your health care provider about your medicines, or medical problems or blood disorders that you have.  This is a safe procedure. However, problems may occur, including infection, bleeding, or premature labor.  Follow all instructions about eating and drinking before the procedure. Plan to have someone drive you home from the hospital or clinic. This information is not intended to replace advice given to you by your health care provider. Make sure you discuss any questions you have with your health care provider. Document Revised: 07/21/2019 Document Reviewed: 05/20/2019 Elsevier  Patient Education  2021 [employer] Inc.  

## 2020-12-19 NOTE — MAU Provider Note (Addendum)
History     CSN: [id number]  Arrival date and time: 12/19/20 1246   Event Date/Time   First Provider Initiated Contact with Patient 12/19/20 1437      Chief Complaint  Patient presents with  . Fetal Evaluation    Not able to hear baby's heartbeat with doppler or ultrasound   Cynthia Valenzuela is a 25 y.o. year old G41P0150 female at 19w4dweeks gestation who presents to MAU reporting she had an OB appointment this morning and the doctor was not able to hear the baby's heartbeat with a doppler or the ultrasound. She has an extensive h/o miscarriages. This scared her so she came here for evaluation. She states she told the doctor that she "has occasional abdominal cramps, but they are relieved with Tylenol. [facility]    OB History    Gravida  7   Para  1   Term  0   Preterm  1   AB  5   Living  0     SAB  4   IAB  1   Ectopic  0   Multiple  0   Live Births  1        Obstetric Comments  Preterm child deceased due to loss from incompetent cervix (Had cerclage)        Past Medical History:  Diagnosis Date  . Headache   . Infection    UTI  . Obesities, morbid ([facility]   . Preterm labor     Past Surgical History:  Procedure Laterality Date  . CERVICAL CERCLAGE    . WISDOM TOOTH EXTRACTION      Family History  Problem Relation Age of Onset  . Alcohol abuse Father   . Diabetes Mother   . Diabetes Sister     Social History   Tobacco Use  . Smoking status: Former Smoker    Types: Cigarettes    Quit date: 11/21/2018    Years since quitting: 2.0  . Smokeless tobacco: Never Used  . Tobacco comment: vapes  Vaping Use  . Vaping Use: Former  . Quit date: 11/08/2020  . Substances: Nicotine, Flavoring  Substance Use Topics  . Alcohol use: Not Currently    Comment: occasionally, weekends  . Drug use: Not Currently    Types: Marijuana    Comment: 04/22/2015 quit using marijuana     Allergies: No Known  Allergies  Medications Prior to Admission  Medication Sig Dispense Refill Last Dose  . acetaminophen (TYLENOL) 500 MG tablet Take 500-1,000 mg by mouth every 6 (six) hours as needed (pain).     .Marland Kitchenaspirin 81 MG chewable tablet Chew 1 tablet (81 mg total) by mouth daily. 30 tablet 6   . Blood Pressure Monitoring (BLOOD PRESSURE KIT) DEVI 1 Device by Does not apply route as needed. 1 each 0   . Prenatal Vit-Fe Fumarate-FA (PRENATAL VITAMIN PO) Take 1 tablet by mouth in the morning.       Review of Systems  Constitutional: Negative.   HENT: Negative.   Eyes: Negative.   Respiratory: Negative.   Cardiovascular: Negative.   Gastrointestinal: Negative.   Endocrine: Negative.   Genitourinary:       Worried they [facility]  Musculoskeletal: Negative.   Skin: Negative.   Allergic/Immunologic: Negative.   Neurological: Negative.   Hematological: Negative.   Psychiatric/Behavioral: Negative.    Physical Exam   Blood pressure  136/83, pulse 82, temperature 98.4 F (36.9 C), temperature source Oral, resp. rate 20, last menstrual period 09/07/2020, SpO2 99 %, unknown if currently breastfeeding.  Physical Exam Vitals and nursing note reviewed.  Constitutional:      Appearance: Normal appearance. She is obese.  Genitourinary:    Comments: Pelvic exam Skin:    General: Skin is warm and dry.  Neurological:     Mental Status: She is alert and oriented to person, place, and time.  Psychiatric:        Mood and Affect: Mood normal.        Behavior: Behavior normal.        Thought Content: Thought content normal.        Judgment: Judgment normal.     MAU Course  Procedures  MDM OB < 14 wks U/S  US OB Comp Less 14 Wks  Result Date: 12/19/2020 CLINICAL DATA:  Dating, viability and abdominal pain EXAM: OBSTETRIC <14 WK ULTRASOUND TECHNIQUE: Transabdominal ultrasound was performed for evaluation of the gestation as well as  the maternal uterus and adnexal regions. COMPARISON:  None. FINDINGS: Intrauterine gestational sac: Single Yolk sac:  Visualized. Embryo:  Visualized. Cardiac Activity: Visualized. Heart Rate: 162 bpm CRL:   72.8 mm   13 w 3 d                  [place] EDC: 06/23/2021 Subchorionic hemorrhage:  None visualized. Maternal uterus/adnexae: Unremarkable IMPRESSION: Single viable intrauterine pregnancy described above Electronically Signed   By: Dahlia Bailiff MD   On: 12/19/2020 14:47    Assessment and Plan  Intrauterine pregnancy  - Reassurance given by explaining normal U/S results - Pictures given from U/S  Abdominal pain affecting pregnancy  - Information provided on RLP  [redacted] weeks gestation of pregnancy   - Discharge patient - Keep U/S appointment on 12/23/2020 - Patient verbalized an understanding of the plan of care and agrees.    Laury Deep, CNM 12/19/2020, 2:37 PM

## 2020-12-19 NOTE — MAU Note (Signed)
No spotting since last episode.  Has pain occaisionally.  Could not here FH with doppler, or with bedside US.  Pt was sitting up when they did [place]. In past has had to have internal [place] because of habitus.  They scheduled her for Thurs.   Has cerclage scheduled for next Monday.  Is very anxious.

## 2020-12-19 NOTE — Progress Notes (Signed)
   PRENATAL VISIT NOTE  Subjective:  Cynthia Valenzuela is a 25 y.o. G7P0150 at [redacted]w[redacted]d being seen today for ongoing prenatal care.  She is currently monitored for the following issues for this high-risk pregnancy and has Frequent headaches; Incompetent cervix; COVID-19; Supervision of high risk pregnancy, antepartum; History of incompetent cervix, currently pregnant; Obesity in pregnancy; Bleeding in early pregnancy; History of preterm delivery, currently pregnant; History of COVID-19; and BMI 60.0-69.9, adult (HCC) on their problem list.  Patient doing well with no acute concerns today. She reports no complaints.  Contractions: Not present. Vag. Bleeding: None.  Movement: Absent. Denies leaking of fluid.   The following portions of the patient's history were reviewed and updated as appropriate: allergies, current medications, past family history, past medical history, past social history, past surgical history and problem list. Problem list updated.  Objective:   Vitals:   12/19/20 1116  BP: (!) 120/46  Pulse: 86  Weight: (!) 361 lb (163.7 kg)    Fetal Status:     Movement: Absent     General:  Alert, oriented and cooperative. Patient is in no acute distress.  Skin: Skin is warm and dry. No rash noted.   Cardiovascular: Normal heart rate noted  Respiratory: Normal respiratory effort, no problems with respiration noted  Abdomen: Soft, gravid, appropriate for gestational age.  Pain/Pressure: Absent     Pelvic: Cervical exam deferred        Extremities: Normal range of motion.  Edema: None  Mental Status:  Normal mood and affect. Normal behavior. Normal judgment and thought content.  Attempted transabdominal scan for viability.  Fetus could not be reliably evaluated due to obesity Assessment and Plan:  Pregnancy: G7P0150 at [redacted]w[redacted]d  1. Supervision of high risk pregnancy, antepartum We are attempting to schedule TVUS for viability scan - aspirin 81 MG chewable tablet; Chew 1 tablet (81 mg  total) by mouth daily.  Dispense: 30 tablet; Refill: 6 - [place] MFM OB TRANSVAGINAL; Future  2. History of incompetent cervix, currently pregnant Pt has cerclage scheduled for next monday  3. Obesity in pregnancy   4. Bleeding in early pregnancy   5. History of preterm delivery, currently pregnant   6. History of COVID-19   7. Incompetent cervix   8. Morbid obesity (HCC)   9. BMI 60.0-69.9, adult (HCC)   Preterm labor symptoms and general obstetric precautions including but not limited to vaginal bleeding, contractions, leaking of fluid and fetal movement were reviewed in detail with the patient.  Please refer to After Visit Summary for other counseling recommendations.   Return in about 3 weeks (around 01/09/2021) for [facility], in person.   Mariel Aloe, MD Faculty Attending Center for [facility]

## 2020-12-23 ENCOUNTER — Other Ambulatory Visit: Payer: Medicaid Other

## 2020-12-24 ENCOUNTER — Other Ambulatory Visit (HOSPITAL_COMMUNITY)
Admission: RE | Admit: 2020-12-24 | Discharge: 2020-12-24 | Disposition: A | Discharge status: Home / Self Care | Payer: Medicaid Other | Source: Ambulatory Visit | Attending: Obstetrics and Gynecology | Admitting: Obstetrics and Gynecology

## 2020-12-24 DIAGNOSIS — Z20822 Contact with and (suspected) exposure to covid-19: Secondary | ICD-10-CM | POA: Diagnosis not present

## 2020-12-24 DIAGNOSIS — Z01812 Encounter for preprocedural laboratory examination: Secondary | ICD-10-CM | POA: Diagnosis present

## 2020-12-24 LAB — SARS CORONAVIRUS 2 (TAT 6-24 HRS): SARS Coronavirus 2: NEGATIVE

## 2020-12-25 NOTE — Anesthesia Preprocedure Evaluation (Addendum)
Anesthesia Evaluation  Patient identified by MRN, date of birth, ID band Patient awake    Reviewed: Allergy & Precautions, NPO status , Patient's Chart, lab work & pertinent test results  History of Anesthesia Complications Negative for: history of anesthetic complications  Airway Mallampati: III  TM Distance: >3 FB Neck ROM: Full    Dental no notable dental hx. (+) Dental Advisory Given   Pulmonary neg pulmonary ROS, former smoker,    Pulmonary exam normal        Cardiovascular negative cardio ROS Normal cardiovascular exam     Neuro/Psych negative neurological ROS     GI/Hepatic negative GI ROS, Neg liver ROS,   Endo/Other  Morbid obesity  Renal/GU negative Renal ROS     Musculoskeletal negative musculoskeletal ROS (+)   Abdominal   Peds  Hematology negative hematology ROS (+)   Anesthesia Other Findings   Reproductive/Obstetrics (+) Pregnancy                            Anesthesia Physical Anesthesia Plan  ASA: III  Anesthesia Plan: Spinal   Post-op Pain Management:    Induction:   PONV Risk Score and Plan: 2 and Ondansetron and Scopolamine patch - Pre-op  Airway Management Planned:   Additional Equipment:   Intra-op Plan:   Post-operative Plan:   Informed Consent: I have reviewed the patients History and Physical, chart, labs and discussed the procedure including the risks, benefits and alternatives for the proposed anesthesia with the patient or authorized representative who has indicated his/her understanding and acceptance.     Dental advisory given  Plan Discussed with: CRNA and Anesthesiologist  Anesthesia Plan Comments:        Anesthesia Quick Evaluation

## 2020-12-26 ENCOUNTER — Encounter (HOSPITAL_COMMUNITY): Payer: Self-pay | Admitting: Obstetrics and Gynecology

## 2020-12-26 ENCOUNTER — Ambulatory Visit (HOSPITAL_COMMUNITY): Payer: Medicaid Other | Admitting: Anesthesiology

## 2020-12-26 ENCOUNTER — Ambulatory Visit (HOSPITAL_COMMUNITY)
Admission: RE | Admit: 2020-12-26 | Discharge: 2020-12-26 | Disposition: A | Discharge status: Home / Self Care | Payer: Medicaid Other | Source: Ambulatory Visit | Attending: Obstetrics and Gynecology | Admitting: Obstetrics and Gynecology

## 2020-12-26 ENCOUNTER — Other Ambulatory Visit: Payer: Self-pay

## 2020-12-26 ENCOUNTER — Encounter (HOSPITAL_COMMUNITY)
Admission: RE | Disposition: A | Discharge status: Home / Self Care | Payer: Self-pay | Source: Ambulatory Visit | Attending: Obstetrics and Gynecology

## 2020-12-26 DIAGNOSIS — Z8759 Personal history of other complications of pregnancy, childbirth and the puerperium: Secondary | ICD-10-CM | POA: Insufficient documentation

## 2020-12-26 DIAGNOSIS — Z3A14 14 weeks gestation of pregnancy: Secondary | ICD-10-CM | POA: Diagnosis not present

## 2020-12-26 DIAGNOSIS — O3432 Maternal care for cervical incompetence, second trimester: Secondary | ICD-10-CM | POA: Diagnosis present

## 2020-12-26 DIAGNOSIS — Z8751 Personal history of pre-term labor: Secondary | ICD-10-CM | POA: Diagnosis not present

## 2020-12-26 DIAGNOSIS — Z87891 Personal history of nicotine dependence: Secondary | ICD-10-CM | POA: Diagnosis not present

## 2020-12-26 HISTORY — PX: CERVICAL CERCLAGE: SHX1329

## 2020-12-26 LAB — CBC
HCT: 37.3 % (ref 36.0–46.0)
Hemoglobin: 12.6 g/dL (ref 12.0–15.0)
MCH: 29 pg (ref 26.0–34.0)
MCHC: 33.8 g/dL (ref 30.0–36.0)
MCV: 85.7 fL (ref 80.0–100.0)
Platelets: 364 10*3/uL (ref 150–400)
RBC: 4.35 MIL/uL (ref 3.87–5.11)
RDW: 13.9 % (ref 11.5–15.5)
WBC: 8.8 10*3/uL (ref 4.0–10.5)
nRBC: 0 % (ref 0.0–0.2)

## 2020-12-26 SURGERY — CERCLAGE, CERVIX, VAGINAL APPROACH
Anesthesia: Spinal

## 2020-12-26 MED ORDER — KETOROLAC TROMETHAMINE 30 MG/ML IJ SOLN
30.0000 mg | Freq: Once | INTRAMUSCULAR | Status: AC
  Administered 2020-12-26: 30 mg via INTRAVENOUS

## 2020-12-26 MED ORDER — POVIDONE-IODINE 10 % EX SWAB: 2.0000 "application " | Freq: Once | CUTANEOUS | Status: DC

## 2020-12-26 MED ORDER — DOCUSATE SODIUM 100 MG PO CAPS: 100.0000 mg | ORAL_CAPSULE | Freq: Two times a day (BID) | ORAL | 2 refills | Status: DC | PRN

## 2020-12-26 MED ORDER — BUPIVACAINE IN DEXTROSE 0.75-8.25 % IT SOLN
INTRATHECAL | Status: DC | PRN
  Administered 2020-12-26: 1.2 mL via INTRATHECAL

## 2020-12-26 MED ORDER — LIDOCAINE HCL 1 % IJ SOLN
INTRAMUSCULAR | Status: DC | PRN
  Administered 2020-12-26: 10 mL

## 2020-12-26 MED ORDER — LIDOCAINE HCL 1 % IJ SOLN
INTRAMUSCULAR | Status: AC
  Filled 2020-12-26: qty 20

## 2020-12-26 MED ORDER — LACTATED RINGERS IV SOLN: INTRAVENOUS | Status: DC | PRN

## 2020-12-26 MED ORDER — OXYCODONE-ACETAMINOPHEN 5-325 MG PO TABS: 1.0000 | ORAL_TABLET | Freq: Four times a day (QID) | ORAL | 0 refills | Status: DC | PRN

## 2020-12-26 MED ORDER — KETOROLAC TROMETHAMINE 30 MG/ML IJ SOLN
INTRAMUSCULAR | Status: AC
  Filled 2020-12-26: qty 1

## 2020-12-26 MED ORDER — FENTANYL CITRATE (PF) 100 MCG/2ML IJ SOLN
25.0000 ug | INTRAMUSCULAR | Status: DC | PRN
Start: 2020-12-26 — End: 2020-12-26

## 2020-12-26 SURGICAL SUPPLY — 17 items
CANISTER SUCT 3000ML PPV (MISCELLANEOUS) ×4 IMPLANT
GLOVE BIOGEL PI IND STRL 6.5 (GLOVE) ×1 IMPLANT
GLOVE BIOGEL PI IND STRL 7.0 (GLOVE) ×1 IMPLANT
GLOVE BIOGEL PI INDICATOR 6.5 (GLOVE) ×1
GLOVE BIOGEL PI INDICATOR 7.0 (GLOVE) ×1
GLOVE SURG SS PI 6.5 STRL IVOR (GLOVE) ×2 IMPLANT
GOWN STRL REUS W/TWL LRG LVL3 (GOWN DISPOSABLE) ×4 IMPLANT
HOVERMATT SINGLE USE (MISCELLANEOUS) ×2 IMPLANT
NS IRRIG 1000ML POUR BTL (IV SOLUTION) ×2 IMPLANT
PACK VAGINAL MINOR WOMEN LF (CUSTOM PROCEDURE TRAY) ×2 IMPLANT
PAD OB MATERNITY 4.3X12.25 (PERSONAL CARE ITEMS) ×2 IMPLANT
PAD PREP 24X48 CUFFED NSTRL (MISCELLANEOUS) ×2 IMPLANT
SUT PROLENE 0 CT 1 30 (SUTURE) ×2 IMPLANT
TOWEL OR 17X24 6PK STRL BLUE (TOWEL DISPOSABLE) ×4 IMPLANT
TRAY FOLEY W/BAG SLVR 14FR (SET/KITS/TRAYS/PACK) ×2 IMPLANT
TUBING NON-CON 1/4 X 20 CONN (TUBING) ×2 IMPLANT
YANKAUER SUCT BULB TIP NO VENT (SUCTIONS) ×2 IMPLANT

## 2020-12-26 NOTE — Op Note (Signed)
Cynthia Valenzuela   PROCEDURE DATE: 12/26/2020  PREOPERATIVE DIAGNOSIS: Intrauterine pregnancy at [redacted]w[redacted]d, history of cervical incompetence   POSTOPERATIVE DIAGNOSIS: The same PROCEDURE: Transvaginal McDonald Cervical Cerclage Placement SURGEON:  Dr. Catalina Antigua  INDICATIONS: 25 y.o. [id number] at [redacted]w[redacted]d with history of cervical incompetence, here for cerclage placement.  The risks of surgery were discussed in detail with the patient including but not limited to: bleeding; infection which may require antibiotic therapy; injury to cervix, vagina other surrounding organs; risk of ruptured membranes and/or preterm delivery and other postoperative or anesthesia complications.  Written informed consent was obtained.    FINDINGS:  About 2 cm palpable cervical length in the vagina, closed cervix, suture knot placed anteriorly.  ANESTHESIA:  Spinal INTRAVENOUS FLUIDS: 1400  ml ESTIMATED BLOOD LOSS: 10 ml COMPLICATIONS: None immediate  PROCEDURE IN DETAIL:  The patient received intravenous antibiotics and had sequential compression devices applied to her lower extremities while in the preoperative area.  Reassuring fetal heart rate was also obtained using a doppler. She was then taken to the operating room where spinal anesthesia was administered and was found to be adequate.  She was placed in the dorsal lithotomy, and was prepped and draped in a sterile manner. Her bladder was catheterized for an unmeasured amount of clear, yellow urine.  After an adequate timeout was performed, a vaginal speculum was then placed in the patient's vagina and a single tooth tenaculum was applied to the anterior lip of the cervix.  A paracervical block using 1% Marcaine was administered.  The anterior and posterior lips of the cervix was grasped with ring forceps. A curved needle loaded with a number 1 Prolene suture was inserted at 12 o'clock, as high as possible at the junction of the rugated vaginal epithelium and the smooth  cervix, at least 2 cm above the external os.  Four bites are taken circumferentially around the entire cervix in a purse-string fashion, each bite should be deep enough to extend at least midway into the cervical stroma, but not into the endocervical canal. The two ends of the suture were then tied securely anteriorly and cut, leaving the ends long enough to grasp with a clamp when it is time to remove it. There was minimal bleeding noted and the ring forceps were removed with good hemostasis noted.  All instruments were removed from the patient's vagina.  Instrument, needle and sponge counts were correct x 2. The patient tolerated the procedure well, and was taken to the recovery area awake and in stable condition. Reassuring fetal heart rate was also obtained using a doppler in the recovery area.  The patient will be discharged to home as per PACU criteria.  Routine postoperative instructions given.  She was prescribed Percocet and Colace.  She will follow up in the clinic on 01/03/21 for postoperative evaluation and ongoing prenatal care

## 2020-12-26 NOTE — Anesthesia Procedure Notes (Signed)
Spinal  Patient location during procedure: OR Start time: 12/26/2020 11:49 AM End time: 12/26/2020 11:58 AM Reason for block: surgical anesthesia Staffing Performed: anesthesiologist  Anesthesiologist: Heather Roberts, MD Preanesthetic Checklist Completed: patient identified, IV checked, risks and benefits discussed, surgical consent, monitors and equipment checked, pre-op evaluation and timeout performed Spinal Block Patient position: sitting Prep: DuraPrep Patient monitoring: cardiac monitor, continuous pulse ox and blood pressure Approach: midline Location: L2-3 Injection technique: single-shot Needle Needle type: Pencan  Needle gauge: 24 G Needle length: 9 cm Assessment Events: CSF return Additional Notes Functioning IV was confirmed and monitors were applied. Sterile prep and drape, including hand hygiene and sterile gloves were used. The patient was positioned and the spine was prepped. The skin was anesthetized with lidocaine.  Free flow of clear CSF was obtained prior to injecting local anesthetic into the CSF.  The spinal needle aspirated freely following injection.  The needle was carefully withdrawn.  The patient tolerated the procedure well.

## 2020-12-26 NOTE — Discharge Instructions (Signed)
Cervical Cerclage, Care After This sheet gives you information about how to care for yourself after your procedure. Your health care provider may also give you more specific instructions. If you have problems or questions, contact your health care provider. What can I expect after the procedure? After your procedure, it is common to have:  Cramping in your abdomen.  Mucus discharge from your vagina. This may last for several days.  Painful urination (dysuria).  Spotting, or small drops of blood coming from your vagina. Follow these instructions at home: Medicines  Take over-the-counter and prescription medicines only as told by your health care provider.  Ask your health care provider if the medicine prescribed to you requires you to avoid driving or using heavy machinery. General instructions  If you are told to go on bed rest, follow instructions from your health care provider. You may need to be on bed rest for up to 3 days.  Keep track of your vaginal discharge and watch for any changes. If you notice changes, tell your health care provider.  Avoid physical activities and exercise until your health care provider approves. Ask your health care provider what activities are safe for you.  Do not douche or have sex until your health care provider says it is okay to do so.  Keep all follow-up visits, including prenatal visits, as told by your health care provider. This is important. ? Prenatal visits are all the care that you receive before the birth of your baby. ? You may also need an ultrasound.  You may be asked to have weekly visits to have your cervix checked.   Contact a health care provider if you:  Have abnormal discharge from your vagina, such as clots.  Have a bad-smelling discharge from your vagina.  Develop a rash on your skin. This may look like redness and swelling.  Become light-headed or feel like you are going to faint.  Have abdominal pain that does not  get better with medicine.  Have nausea or vomiting that does not go away. Get help right away if you:  Have vaginal bleeding that is heavier or more frequent than spotting.  Are leaking fluid or your water breaks.  Have a fever or chills.  Faint.  Have uterine contractions. These may feel like: ? A back ache. ? Lower abdominal pain. ? Mild cramps, similar to menstrual cramps. ? Tightening or pressure in your abdomen.  Think that your baby is not moving as much as usual, or you cannot feel your baby move.  Have chest pain.  Have shortness of breath. Summary  After the procedure, it is common to have cramping, vaginal discharge, painful urination, and small drops of blood coming from your vagina.  If you are told to go on bed rest, follow instructions from your health care provider. You may need to be on bed rest for up to 3 days.  Keep track of your vaginal discharge and watch for any changes. If you notice changes, tell your health care provider.  Contact a health care provider if you have abnormal vaginal discharge, become light-headed, or have pain that cannot be controlled with medicines.  Get help right away if you have heavy vaginal bleeding, your water breaks, or you have uterine contractions. Also, get help right away if your baby is not moving as much as usual, or you have chest pain or shortness of breath. This information is not intended to replace advice given to you by your health care   provider. Make sure you discuss any questions you have with your health care provider. Document Revised: 11/13/2019 Document Reviewed: 05/20/2019 Elsevier Patient Education  2021 Elsevier Inc.  

## 2020-12-26 NOTE — H&P (Signed)
Cynthia Valenzuela is a 25 y.o. female P0150 at 14.4 wks presenting for scheduled for cervical cerclage.  Patient with a history of cervical incompetence resulting with a 23 wk loss. Patient had a rescue cerclage placed for that pregnancy. She is doing well today and denies any pelvic pain or abnormal discharge. She denies any vaginal bleeding. Patient with prenatal care at [facility] otherwise complicated by maternal obseity  OB History    Gravida  7   Para  1   Term  0   Preterm  1   AB  5   Living  0     SAB  4   IAB  1   Ectopic  0   Multiple  0   Live Births  1        Obstetric Comments  Preterm child deceased due to loss from incompetent cervix (Had cerclage)       Past Medical History:  Diagnosis Date  . Headache   . Infection    UTI  . Obesities, morbid (HCC)   . Preterm labor    Past Surgical History:  Procedure Laterality Date  . CERVICAL CERCLAGE    . WISDOM TOOTH EXTRACTION     Family History: family history includes Alcohol abuse in her father; Diabetes in her mother and sister. Social History:  reports that she quit smoking about 2 years ago. Her smoking use included cigarettes. She has never used smokeless tobacco. She reports previous alcohol use. She reports previous drug use. Drug: Marijuana.   Review of Systems  See pertinent in HPI. All other systems reviewed and non contributory History   Blood pressure 139/82, pulse 99, temperature 98.9 F (37.2 C), temperature source Oral, resp. rate 18, height 5\' 5"  (1.651 m), weight (!) 163.7 kg, last menstrual period 09/07/2020, SpO2 99 %, unknown if currently breastfeeding. Exam Physical Exam  GENERAL: Well-developed, well-nourished female in no acute distress.  LUNGS: Clear to auscultation bilaterally.  HEART: Regular rate and rhythm. ABDOMEN: Soft, nontender, nondistended. Obese PELVIC: Deferred to OR EXTREMITIES: No cyanosis, clubbing, or edema, 2+ distal pulses.  Prenatal labs: ABO, Rh:  A/Positive/-- (02/21 1158) Antibody: Negative (02/21 1158) Rubella: 1.76 (02/21 1158) RPR: Non Reactive (02/21 1158)  HBsAg: Negative (02/21 1158)  HIV: Non Reactive (02/21 1158)  GBS:     Assessment/Plan: 25 yo P0150 with history of cervical incompetence here for cervical cerclage. - Risks, benefits and alternatives were reviewed including but not limited to risks of bleeding, infection , rupture of membrane and damage to adjacent organs - Patient verbalized understanding and all questions were answered    Sahib Pella 12/26/2020, 10:39 AM

## 2020-12-26 NOTE — Anesthesia Postprocedure Evaluation (Signed)
Anesthesia Post Note  Patient: [name]  Procedure(s) Performed: CERCLAGE CERVICAL (N/A )     Patient location during evaluation: PACU Anesthesia Type: Spinal Level of consciousness: awake and alert Pain management: pain level controlled Vital Signs Assessment: post-procedure vital signs reviewed and stable Respiratory status: spontaneous breathing and respiratory function stable Cardiovascular status: blood pressure returned to baseline and stable Postop Assessment: spinal receding Anesthetic complications: no   No complications documented.  Last Vitals:  Vitals:   12/26/20 1500 12/26/20 1515  BP: 128/66 (!) 136/57  Pulse: 94 98  Resp: 15 (!) 33  Temp:    SpO2: 100% 100%    Last Pain:  Vitals:   12/26/20 1500  TempSrc:   PainSc: 0-No pain   Pain Goal:    LLE Motor Response: Purposeful movement (12/26/20 1500)   RLE Motor Response: Purposeful movement (12/26/20 1500)       Epidural/Spinal Function Cutaneous sensation: Able to Discern Pressure (12/26/20 1500), Patient able to flex knees: Yes (12/26/20 1500), Patient able to lift hips off bed: No (12/26/20 1500), Back pain beyond tenderness at insertion site: No (12/26/20 1500), Progressively worsening motor and/or sensory loss: No (12/26/20 1500), Bowel and/or bladder incontinence post epidural: No (12/26/20 1500)  Loetta Connelley DANIEL

## 2020-12-26 NOTE — Transfer of Care (Signed)
Immediate Anesthesia Transfer of Care Note  Patient: Cynthia Valenzuela  Procedure(s) Performed: CERCLAGE CERVICAL (N/A )  Patient Location: PACU  Anesthesia Type:Spinal  Level of Consciousness: awake  Airway & Oxygen Therapy: Patient Spontanous Breathing  Post-op Assessment: Report given to RN  Post vital signs: Reviewed and stable  Last Vitals:  Vitals Value Taken Time  BP 122/46 12/26/20 1235  Temp    Pulse 79 12/26/20 1238  Resp 15 12/26/20 1238  SpO2 100 % 12/26/20 1238  Vitals shown include unvalidated device data.  Last Pain:  Vitals:   12/26/20 1003  TempSrc: Oral         Complications: No complications documented.

## 2020-12-26 NOTE — Progress Notes (Signed)
Pt. Ambulate to bathroom. Provider Dr. Jolayne Panther assessed FHR after cerclage with bedside u/s. Discharge instructions provided and patient understood.

## 2021-01-03 ENCOUNTER — Ambulatory Visit: Payer: Medicaid Other | Attending: Obstetrics and Gynecology

## 2021-01-03 ENCOUNTER — Other Ambulatory Visit: Payer: Self-pay

## 2021-01-03 ENCOUNTER — Ambulatory Visit (INDEPENDENT_AMBULATORY_CARE_PROVIDER_SITE_OTHER): Payer: Medicaid Other | Admitting: Medical

## 2021-01-03 ENCOUNTER — Encounter: Payer: Self-pay | Admitting: *Deleted

## 2021-01-03 ENCOUNTER — Ambulatory Visit: Payer: Medicaid Other | Admitting: *Deleted

## 2021-01-03 ENCOUNTER — Other Ambulatory Visit: Payer: Medicaid Other

## 2021-01-03 VITALS — BP 109/66 | HR 88 | Wt 366.0 lb

## 2021-01-03 DIAGNOSIS — O209 Hemorrhage in early pregnancy, unspecified: Secondary | ICD-10-CM | POA: Insufficient documentation

## 2021-01-03 DIAGNOSIS — O99210 Obesity complicating pregnancy, unspecified trimester: Secondary | ICD-10-CM | POA: Diagnosis present

## 2021-01-03 DIAGNOSIS — O09299 Supervision of pregnancy with other poor reproductive or obstetric history, unspecified trimester: Secondary | ICD-10-CM | POA: Diagnosis present

## 2021-01-03 DIAGNOSIS — O0990 Supervision of high risk pregnancy, unspecified, unspecified trimester: Secondary | ICD-10-CM | POA: Diagnosis present

## 2021-01-03 DIAGNOSIS — Z6841 Body Mass Index (BMI) 40.0 and over, adult: Secondary | ICD-10-CM

## 2021-01-03 DIAGNOSIS — O09899 Supervision of other high risk pregnancies, unspecified trimester: Secondary | ICD-10-CM

## 2021-01-03 DIAGNOSIS — R7309 Other abnormal glucose: Secondary | ICD-10-CM

## 2021-01-03 DIAGNOSIS — N883 Incompetence of cervix uteri: Secondary | ICD-10-CM

## 2021-01-03 DIAGNOSIS — Z3A15 15 weeks gestation of pregnancy: Secondary | ICD-10-CM

## 2021-01-03 DIAGNOSIS — Z8616 Personal history of COVID-19: Secondary | ICD-10-CM

## 2021-01-03 NOTE — Patient Instructions (Addendum)
Cervical Cerclage, Care After This sheet gives you information about how to care for yourself after your procedure. Your health care provider may also give you more specific instructions. If you have problems or questions, contact your health care provider. What can I expect after the procedure? After your procedure, it is common to have:  Cramping in your abdomen.  Mucus discharge from your vagina. This may last for several days.  Painful urination (dysuria).  Spotting, or small drops of blood coming from your vagina. Follow these instructions at home: Medicines  Take over-the-counter and prescription medicines only as told by your health care provider.  Ask your health care provider if the medicine prescribed to you requires you to avoid driving or using heavy machinery. General instructions  If you are told to go on bed rest, follow instructions from your health care provider. You may need to be on bed rest for up to 3 days.  Keep track of your vaginal discharge and watch for any changes. If you notice changes, tell your health care provider.  Avoid physical activities and exercise until your health care provider approves. Ask your health care provider what activities are safe for you.  Do not douche or have sex until your health care provider says it is okay to do so.  Keep all follow-up visits, including prenatal visits, as told by your health care provider. This is important. ? Prenatal visits are all the care that you receive before the birth of your baby. ? You may also need an ultrasound.  You may be asked to have weekly visits to have your cervix checked.   Contact a health care provider if you:  Have abnormal discharge from your vagina, such as clots.  Have a bad-smelling discharge from your vagina.  Develop a rash on your skin. This may look like redness and swelling.  Become light-headed or feel like you are going to faint.  Have abdominal pain that does not  get better with medicine.  Have nausea or vomiting that does not go away. Get help right away if you:  Have vaginal bleeding that is heavier or more frequent than spotting.  Are leaking fluid or your water breaks.  Have a fever or chills.  Faint.  Have uterine contractions. These may feel like: ? A back ache. ? Lower abdominal pain. ? Mild cramps, similar to menstrual cramps. ? Tightening or pressure in your abdomen.  Think that your baby is not moving as much as usual, or you cannot feel your baby move.  Have chest pain.  Have shortness of breath. Summary  After the procedure, it is common to have cramping, vaginal discharge, painful urination, and small drops of blood coming from your vagina.  If you are told to go on bed rest, follow instructions from your health care provider. You may need to be on bed rest for up to 3 days.  Keep track of your vaginal discharge and watch for any changes. If you notice changes, tell your health care provider.  Contact a health care provider if you have abnormal vaginal discharge, become light-headed, or have pain that cannot be controlled with medicines.  Get help right away if you have heavy vaginal bleeding, your water breaks, or you have uterine contractions. Also, get help right away if your baby is not moving as much as usual, or you have chest pain or shortness of breath. This information is not intended to replace advice given to you by your health care   provider. Make sure you discuss any questions you have with your health care provider. Document Revised: 11/13/2019 Document Reviewed: 05/20/2019 Elsevier Patient Education  2021 Elsevier Inc. Second Trimester of Pregnancy  The second trimester of pregnancy is from week 13 through week 27. This is also called months 4 through 6 of pregnancy. This is often the time when you feel your best. During the second trimester:  Morning sickness is less or has stopped.  You may have more  energy.  You may feel hungry more often. At this time, your unborn baby (fetus) is growing very fast. At the end of the sixth month, the unborn baby may be up to 12 inches long and weigh about 1 pounds. You will likely start to feel the baby move between 16 and 20 weeks of pregnancy. Body changes during your second trimester Your body continues to go through many changes during this time. The changes vary and generally return to normal after the baby is born. Physical changes  You will gain more weight.  You may start to get stretch marks on your hips, belly (abdomen), and breasts.  Your breasts will grow and may hurt.  Dark spots or blotches may develop on your face.  A dark line from your belly button to the pubic area (linea nigra) may appear.  You may have changes in your hair. Health changes  You may have headaches.  You may have heartburn.  You may have trouble pooping (constipation).  You may have hemorrhoids or swollen, bulging veins (varicose veins).  Your gums may bleed.  You may pee (urinate) more often.  You may have back pain. Follow these instructions at home: Medicines  Take over-the-counter and prescription medicines only as told by your doctor. Some medicines are not safe during pregnancy.  Take a prenatal vitamin that contains at least 600 micrograms (mcg) of folic acid. Eating and drinking  Eat healthy meals that include: ? Fresh fruits and vegetables. ? Whole grains. ? Good sources of protein, such as meat, eggs, or tofu. ? Low-fat dairy products.  Avoid raw meat and unpasteurized juice, milk, and cheese.  You may need to take these actions to prevent or treat trouble pooping: ? Drink enough fluids to keep your pee (urine) pale yellow. ? Eat foods that are high in fiber. These include beans, whole grains, and fresh fruits and vegetables. ? Limit foods that are high in fat and sugar. These include fried or sweet foods. Activity  Exercise  only as told by your doctor. Most people can do their usual exercise during pregnancy. Try to exercise for 30 minutes at least 5 days a week.  Stop exercising if you have pain or cramps in your belly or lower back.  Do not exercise if it is too hot or too humid, or if you are in a place of great height (high altitude).  Avoid heavy lifting.  If you choose to, you may have sex unless your doctor tells you not to. Relieving pain and discomfort  Wear a good support bra if your breasts are sore.  Take warm water baths (sitz baths) to soothe pain or discomfort caused by hemorrhoids. Use hemorrhoid cream if your doctor approves.  Rest with your legs raised (elevated) if you have leg cramps or low back pain.  If you develop bulging veins in your legs: ? Wear support hose as told by your doctor. ? Raise your feet for 15 minutes, 3-4 times a day. ? Limit salt in your  food. Safety  Wear your seat belt at all times when you are in a car.  Talk with your doctor if someone is hurting you or yelling at you a lot. Lifestyle  Do not use hot tubs, steam rooms, or saunas.  Do not douche. Do not use tampons or scented sanitary pads.  Avoid cat litter boxes and soil used by cats. These carry germs that can harm your baby and can cause a loss of your baby by miscarriage or stillbirth.  Do not use herbal medicines, illegal drugs, or medicines that are not approved by your doctor. Do not drink alcohol.  Do not smoke or use any products that contain nicotine or tobacco. If you need help quitting, ask your doctor. General instructions  Keep all follow-up visits. This is important.  Ask your doctor about local prenatal classes.  Ask your doctor about the right foods to eat or for help finding a counselor. Where to find more information  American Pregnancy Association: americanpregnancy.org  [employer] of [employer]st[employer]ians and Gynecologists: www.acog.org  Office on [employer] Health:  MightyReward.co.nz Contact a doctor if:  You have a headache that does not go away when you take medicine.  You have changes in how you see, or you see spots in front of your eyes.  You have mild cramps, pressure, or pain in your lower belly.  You continue to feel like you may vomit (nauseous), you vomit, or you have watery poop (diarrhea).  You have bad-smelling fluid coming from your vagina.  You have pain when you pee or your pee smells bad.  You have very bad swelling of your face, hands, ankles, feet, or legs.  You have a fever. Get help right away if:  You are leaking fluid from your vagina.  You have spotting or bleeding from your vagina.  You have very bad belly cramping or pain.  You have trouble breathing.  You have chest pain.  You faint.  You have not felt your baby move for the time period told by your doctor.  You have new or increased pain, swelling, or redness in an arm or leg. Summary  The second trimester of pregnancy is from week 13 through week 27 (months 4 through 6).  Eat healthy meals.  Exercise as told by your doctor. Most people can do their usual exercise during pregnancy.  Do not use herbal medicines, illegal drugs, or medicines that are not approved by your doctor. Do not drink alcohol.  Call your doctor if you get sick or if you notice anything unusual about your pregnancy. This information is not intended to replace advice given to you by your health care provider. Make sure you discuss any questions you have with your health care provider. Document Revised: 03/02/2020 Document Reviewed: 01/07/2020 Elsevier Patient Education  2021 Elsevier Inc.  AREA PEDIATRIC/FAMILY PRACTICE PHYSICIANS  [place] ([address]) . [facility] Melodie Bouillon, MD; Lum Babe, MD; Sheffield Slider, MD; Leveda Anna, MD; McDiarmid, MD; Jerene Bears, MD; Jennette Kettle, MD; Gwendolyn Grant, MD o [address] [place] [place], [place]  [address] o [phone] o Mon-Fri 8:30-12:30, 1:30-5:00 o Providers come to see babies at [facility] o Accepting Medicaid . [facility] [place] o Limited providers who accept newborns: Docia Chuck, MD; Kateri Plummer, MD; Paulino Rily, MD o [address], [place], [place] [address] o [phone] o Mon-Fri 8:00-5:30 o Babies seen by providers at [facility] o Does NOT accept Medicaid o Please call early in hospitalization for appointment (limited availability)  .  [facility] Fatima Sanger, MD o [address], [place], [place] [address] o [phone] o Mon, Tue, Thur, Fri 8:30-5:00, Wed 10:00-7:00 (closed 1-2pm) o Babies seen by [facility] providers o Accepting Medicaid . Donnie Coffin - Pediatrician Fae Pippin, MD o [address], [place], [place] [address] o [phone] o Mon-Fri 8:30-5:00, Sat 8:30-12:00 o Provider comes to see babies at [facility] o Accepting Medicaid o Must have been referred from current patients or contacted office prior to delivery . [facility]) Leotis Pain, MD; Ave Filter, MD; Luna Fuse, MD; Kennedy Bucker, MD; Konrad Dolores, MD; Kathlene November, MD; Jenne Campus, MD; Lubertha South, MD; Wynetta Emery, MD; Duffy Rhody, MD; Gerre Couch, NP; Shirl Harris, NP o [address], [place], [place] [address] o [phone] o Mon, Tue, Thur, Fri 8:30-5:30, Wed 9:30-5:30, Sat 8:30-12:30 o Babies seen by [facility] providers o Accepting Medicaid o Only accepting infants of first-time parents or siblings of current patients [facility] discharge coordinator will make follow-up appointment . Cyril Mourning o [address], [place], [place]  [address] o [phone]   Fax - [phone] . [facility] o [address], [place], [place]  [address] o Phone - [phone]   Fax - [phone] . Lucio Edward o [address], [place], [place]   [address] o [phone]  [place] [phone]) . [facility] Jorge Mandril, MD; Alita Chyle, MD; Princella Ion, MD; MD; Earlene Plater, MD; Jamesetta Orleans, MD; Alvera Novel, MD; Clarene Duke, MD; Rana Snare, MD; Carmon Ginsberg, MD; Alinda Money, MD; Hosie Poisson, MD; Mayford Knife, MD o [address], [place], [place] [address] o [phone] o Mon-Fri 8:30-5:00 (extended evenings Mon-Thur as needed), Sat-Sun 10:00-1:00 o Providers come to see babies at [facility] o Accepting Medicaid for families of first-time babies and families with all children in the household age 79 and under. Must register with office prior to making appointment (M-F only). [facility] Odella Aquas, NP; Lynelle Doctor, MD; Susann Givens, MD; [place], [place] o [address], [place], [place] [address] o [phone] o Mon-Fri 8:00-5:00 o Babies seen by providers at [facility] o Does NOT accept Medicaid/Commercial Insurance Only . [facility] at [place] ([facility])  Suzette Battiest, MD; Zachery Dauer, MD; Stefan Church, MD; Sabino Dick, MD; Quitman Livings, MD; Farris Has, MD; Gaynell Face, MD; Betha Loa, MD; Colon Flattery, MD; Clifton James, MD o [address] [place] [place], [place] [address] o [phone] o Mon-Fri 8:30-5:30, Sat (Oct.-Mar.) 9:00-1:00 o Babies seen by providers at [facility] o Accepting [facility] [phone]) . [facility] Dimitri, MD; Sheliah Hatch, MD o [address], [place], [place] [address] o [phone] o Mon-Fri 8:30-5:00, Sat 8:30-12:00 o Providers come to see babies at [facility] o Does NOT accept Medicaid . [facility] Cindy [place], [place]; [place], MD; [place], [place]; Wynelle Link, MD; Azucena Cecil, MD o [address] [place], [place] [address] o [phone] o Mon-Fri 8:00-5:00 o Babies seen by providers at [facility] o Does NOT accept Medicaid o Only accepting babies of parents who are patients o Please call early in hospitalization for appointment  (limited availability) . [facility] Lamar Benes, MD; Abran Cantor, MD; Early Osmond, MD; Cherre Huger, NP; Hyacinth Meeker, MD; Dwan Bolt, MD; Jarold Motto, NP; Dario Guardian, MD; Talmage Nap, MD; Maisie Fus, MD; Pricilla Holm, MD; Tama High, MD o [address] [place], [place], [place] [address] o [phone] o Mon-Fri 8:00-5:00, Sat 9:00-12:00 o Providers come to see babies at [facility] o Does NOT  accept [facility] ([id number]) . [facility] at [facility] o Limited providers accepting new patients: Drema Pry, NP; [place], [place] o [address], [place], [place] [address] o [phone] o Mon-Fri 8:00-5:00 o Babies seen by providers at [facility] o Does NOT accept Medicaid o Only accepting babies of parents who are patients o Please call early in hospitalization for appointment (limited availability) . [facility] Luan Pulling, MD; Nash Dimmer, MD o [address] [place] [place], [place] [address] o [phone] (press 1 to schedule appointment) o Mon-Fri 8:00-5:00 o Providers come to see babies at [facility] o Does NOT accept Medicaid . [facility] Cristino Martes, MD o [address], [place], [place] [address] o [phone] o Mon-Fri 8:30-5:00 (lunch 12:30-1:00), extended hours by appointment only Wed 5:00-6:30 o Babies seen by [facility] providers o Accepting Medicaid . [facility] Abbot, MD; [place], MD; Hassan Rowan, MD o [address] [place], [place], [place] [address] o [phone] o Mon-Fri 8:00-5:00 o Babies seen by [facility] providers o Does NOT accept Medicaid . Nature conservation officer at [facility] [address] Elsworth Soho, MD; Durene Cal, MD; [place], DO o [address] [place], [place] [address] o [phone] o Mon-Fri 8:00-5:00 o Babies seen by [facility] providers o Does NOT accept Medicaid . [facility] o [place], [place]; [place], [place]; [place], NP; Avis Epley, MD; Vonna Kotyk, MD; Clance Boll, MD; Stevphen [place], NP; Arvilla Market, NP; Ann Maki, NP; Otis Dials,  NP; Vaughan Basta, MD; [place], MD o [address] [place], [place] [address] o [phone] o Mon-Fri 8:30-5:00, Sat 10:00-1:00 o Providers come to see babies at [facility] o Does NOT accept Medicaid o Free prenatal information session Tuesdays at 4:45pm . [facility] Luna Kitchens, MD; [place], [place]; [place], [place]; [place], [place] o [address] [place] [address] o [phone] o Mon-Fri 7:30-5:30 o Babies seen by [facility] providers . [facility] Children's Doctor o [address], [place], [place]  [address] o [phone]   Fax - [phone]  [place] [phone] & [phone]) . [facility] Alphonsa Overall, MD o [address] [place], [place] [address] o [phone] o Mon-Thur 8:00-6:00 o Providers come to see babies at [facility] o Accepting Medicaid . [facility] Zenon Mayo, NP; Cyndia Bent, MD; [place], [place]; [place], [place] o [address] [place], [place] [address] o [phone] o Mon-Thur 7:30-7:30, Fri 7:30-4:30 o Babies seen by [facility] providers o Accepting Medicaid . [facility] Cheryle Horsfall, MD; Janene Harvey, NP; Vonita Moss, MD o [address], [place], [place] [address] o [phone] o Mon-Fri 8:30-5:00, Sat 8:30-12:00 o Providers come to see babies at [facility] o Accepting Medicaid o Must have "Meet & Greet" appointment at office prior to delivery . [facility] [place] ([facility]) Llana Aliment, MD; Earlene Plater, MD; Lucretia Roers, MD o [address], [place], [place] [address] o [phone] o Mon-Wed 8:00-6:00, Thur-Fri 8:00-5:00, Sat 9:00-12:00 o Providers come to see babies at [facility] o Does NOT accept Medicaid o Only accepting siblings of current patients . [facility]  o [address], [place], [place]  [address] o [phone]   Fax - [phone] . [facility]  [facility] at [facility] o [id number] [place], [place]  [address] o [phone]   Fax - [phone]  [place]west  [phone] & [phone]) . Nature conservation officer at [employer] o Oak [place], DO; [place], DO o [address]  [place], [place] [address] o [phone] o Mon-Fri 7:00-5:00 o Babies seen by [facility] providers o Does NOT accept Medicaid . [facility] Ellis Savage, MD; [place], [place]; [place], [place] o [address], [place], [place] [address] o [phone] o Mon-Fri 8:00-5:00 o Babies seen by [facility] providers o Accepting Medicaid . [facility] - [address] Franne Forts, MD; [place], [place]; [place], NP; [place], [place] o [address] [place], [place], [place] [address] o [phone] o Mon-Fri 8:00-5:00 o Babies seen by providers at [facility] o Accepting [facility] [phone]) . [facility] [place], [place] o [address] [place], [place] [address] o [phone] o Mon-Fri 8:00-5:00 o Babies seen by [facility] providers o Does NOT accept Medicaid o Limited availability, please call early in hospitalization to schedule follow-up . [facility] Jolee Ewing, PA; Eddie Candle, MD; [place], MD; [place], [place]; Constance Goltz, MD; [place], [place] o [address] [facility] [address], [place], [place] [address] o [phone] o Mon-Fri 8:30-5:00, Sat 9:00-12:00 o Babies seen by providers at [facility] o Accepting Medicaid o Please register online then schedule online or call office o [email or web address] . [facility]) Samuella Bruin, NP; Lucianne Muss, MD; Lanier Clam, PA o [address], [place], [place] [address] o [phone] o Mon-Fri 8:00-5:00 o Babies seen by providers at [facility] o Accepting Medicaid . [facility] [place] ([facility]) Sharin Mons, MD; Reed Breech, NP; Shelva Majestic, MD o [address], [place], [place] [address] o [phone] o Mon-Fri 8:00-5:30, Sat&Sun by appointment (phones open at 8:30) o Babies seen by [facility] providers o Accepting Medicaid o Must be a first-time baby or sibling of current patient . [facility] [address], [place], [place]  [address] o [phone]   Fax - [phone]  [place] [phone] & [phone]) . [facility] o [place], [place]; [place], [place]; Dimple Casey, MD; [place], [place]; Carolyne Fiscal, MD o [address], [place], [place] [address] o [phone] o Mon-Thur 8:00-7:00, Fri 8:00-5:00, Sat 8:00-12:00, Sun 9:00-12:00 o Babies seen by [facility] providers o Accepting Medicaid . [facility] at [facility], MD; Gaynell Face, MD; [facility], MD o [address], [place], [place] [address] o [phone] o Mon-Thur 8:00-5:00 o Babies seen by providers at [facility] o Accepting Medicaid . [facility] [place] Gwenlyn Saran, MD; [place], MD; Madilyn Fireman, MD; Melvyn Neth, MD; List, MD; Lazarus Salines, MD; Gaynell Face, MD; Berneda Rose, MD; Flora Lipps, MD; Beryl Meager, MD; Luther Redo, MD; Lavonia Drafts, MD; Kellie Simmering, MD o [address] [place] [place], [place] [address] o [phone] o Mon-Fri 8:00-5:30, Sat (Oct.-Mar.) 9:00-1:00 o Babies seen by providers at [facility] o Accepting Medicaid o Must fill out new patient packet, available online at [email or web address] . [facility] [place] [facility]) Simone Curia, NP; Tiburcio Pea, NP; Tresa Endo, NP; Whitney Post, MD; [place], [place]; Hennie Duos, MD; Wynne Dust, MD; Kavin Leech, NP o [address], [place], [place] [address] o [phone] o Mon-Thur 8:00-5:30, Fri 8:00-5:00 o Babies seen by providers at [facility] o Accepting [facility] [phone]) . [facility] o [place], [place]; [place], MD; Tanya Nones, MD; [place], [place] o [address] [place],  [place] [id number] o [phone] o Mon-Fri 8:00-5:00 o Babies seen by providers at [facility] o [facility]ing [facility]   [place] [phone]) . [facility] [place] o [place], DO; Lenise ArenaMeyers, MD; [place], [place] o [address], [place], [place] [phone] o [phone] o Mon-Fri 8:00-5:00 o Bab[facility] by [facility] at [facility] o Does NOT accept Medicaid o Limited appointment availability, please call early in hospitalization  . Nature conservation officerLeBauer HealthCare at [facility] o BrierKunedd, DO; El CampoMcGowen, MD o [address], [place], [place] [address] o [phone] o Mon-Fri 8:00-5:00 o Babies seen by [facility] providers o Does NOT accept Medicaid . [facility] Lorrine Kino Cameron, MD; Ninetta LightsMacDonald, MD; CosbyMichaels, Georgia[place]; HendersonNayak, MD o [address], [place], [place] [address] o [phone] o Mon-Fri 8:00-5:00 o After hours clinic [facility]([address] [place], [place] [address]) [phone] Mon-Fri 5:00-8:00, Sat 12:00-6:00, Sun 10:00-4:00 o Babies seen by [facility] providers o Accepting Medicaid . [facility] o [address], [place], [place]  [address] o [phone]   Fax - [phone]  [place] [phone] . Nature conservation officerLeBauer HealthCare at [facility] o Andy, MD o [address] [place], [place], [place] [address] o [phone] o Mon-Fri 8:00-5:00 o Babies seen by [facility] providers o Does NOT accept Medicaid . [facility] [place] [facility] [place]) Tomi Likenso Eksir, MD o [address], [place], [place] [address] o [phone] o Mon-Thur 8:00-7:00, Fri 8:00-5:00, Sat 8:00-12:00 o Babies seen by providers at [facility] o Accepting Medicaid - but does not have vaccinations in office (must be received elsewhere) o Limited availability, please call early in  hospitalization  [place] [phone]) . [facility]  o Wyvonne Lenzharlene Flemming, MD o [address], [place] [address] o [phone]  Fax [phone]

## 2021-01-03 NOTE — Progress Notes (Signed)
   PRENATAL VISIT NOTE  Subjective:  Cynthia Valenzuela is a 25 y.o. G7P0150 at [redacted]w[redacted]d being seen today for ongoing prenatal care.  She is currently monitored for the following issues for this high-risk pregnancy and has Frequent headaches; Incompetent cervix; COVID-19; Supervision of high risk pregnancy, antepartum; History of incompetent cervix, currently pregnant; Obesity in pregnancy; Bleeding in early pregnancy; History of preterm delivery, currently pregnant; History of COVID-19; and BMI 60.0-69.9, adult (HCC) on their problem list.  Patient reports no complaints.  Contractions: Not present. Vag. Bleeding: None.  Movement: Absent. Denies leaking of fluid.   The following portions of the patient's history were reviewed and updated as appropriate: allergies, current medications, past family history, past medical history, past social history, past surgical history and problem list.   Objective:   Vitals:   01/03/21 0905  BP: 109/66  Pulse: 88  Weight: (!) 366 lb (166 kg)    Fetal Status:     Movement: Absent     General:  Alert, oriented and cooperative. Patient is in no acute distress.  Skin: Skin is warm and dry. No rash noted.   Cardiovascular: Normal heart rate noted  Respiratory: Normal respiratory effort, no problems with respiration noted  Abdomen: Soft, gravid, appropriate for gestational age.  Pain/Pressure: Absent     Pelvic: Cervical exam deferred        Extremities: Normal range of motion.  Edema: None  Mental Status: Normal mood and affect. Normal behavior. Normal judgment and thought content.   Assessment and Plan:  Pregnancy: G7P0150 at [redacted]w[redacted]d 1. Supervision of high risk pregnancy, antepartum - Glucose Tolerance, 2 Hours w/1 Hour - AFP, Serum, Open Spina Bifida - Peds list given  - Discussed MOC, considering Paragard - Anatomy US scheduled 4/18  2. History of preterm delivery, currently pregnant - Due to incompetent cervix   3. Incompetent cervix - Cerclage  placed 12/26/20  4. Obesity in pregnancy  5. BMI 60.0-69.9, adult (HCC) - Appropriate weight gain for pregnancy discussed   6. Elevated hemoglobin A1c - 2 hour early GTT today   7. [redacted] weeks gestation of pregnancy  Preterm labor/ second trimester symptoms and general obstetric precautions including but not limited to vaginal bleeding, contractions, leaking of fluid and fetal movement were reviewed in detail with the patient. Please refer to After Visit Summary for other counseling recommendations.   Return in about 4 weeks (around 01/31/2021) for [facility] MD only, In-Person.  Future Appointments  Date Time Provider Department Center  01/03/2021  1:45 PM [facility] NURSE [facility]  01/03/2021  2:00 PM [facility] US1 [facility]  01/23/2021  8:30 AM [facility] NURSE [facility]  01/23/2021  8:45 AM [facility] US5 [facility]  02/01/2021 10:15 AM Constant, Gigi Gin, MD [facility]    Vonzella Nipple, PA-C

## 2021-01-04 LAB — GLUCOSE TOLERANCE, 2 HOURS W/ 1HR
Glucose, 1 hour: 163 mg/dL (ref 65–179)
Glucose, 2 hour: 130 mg/dL (ref 65–152)
Glucose, Fasting: 92 mg/dL — ABNORMAL HIGH (ref 65–91)

## 2021-01-11 LAB — AFP, SERUM, OPEN SPINA BIFIDA
AFP MoM: 1.11
AFP Value: 22.5 ng/mL
Gest. Age on Collection Date: 15.5 weeks
Maternal Age At EDD: 26.1 yr
OSBR Risk 1 IN: 8647
Test Results:: NEGATIVE
Weight: 366 [lb_av]

## 2021-01-19 ENCOUNTER — Other Ambulatory Visit: Payer: Self-pay

## 2021-01-19 ENCOUNTER — Other Ambulatory Visit: Payer: Self-pay | Admitting: Pharmacist

## 2021-01-19 ENCOUNTER — Ambulatory Visit: Payer: Medicaid Other | Admitting: Registered"

## 2021-01-19 ENCOUNTER — Encounter: Payer: Medicaid Other | Attending: Family Medicine | Admitting: Registered"

## 2021-01-19 DIAGNOSIS — O24419 Gestational diabetes mellitus in pregnancy, unspecified control: Secondary | ICD-10-CM

## 2021-01-19 DIAGNOSIS — Z8632 Personal history of gestational diabetes: Secondary | ICD-10-CM | POA: Insufficient documentation

## 2021-01-19 MED ORDER — ACCU-CHEK FASTCLIX LANCETS MISC: 1.0000 | Freq: Four times a day (QID) | 4 refills | Status: DC

## 2021-01-19 MED ORDER — GLUCOSE BLOOD VI STRP
1.0000 | ORAL_STRIP | Freq: Four times a day (QID) | 4 refills | Status: DC
Start: 2021-01-19 — End: 2021-06-04

## 2021-01-19 NOTE — Progress Notes (Signed)
Rx sent for glucometer test strips and lancets.

## 2021-01-19 NOTE — Progress Notes (Signed)
Patient was seen on 01/19/21 for Gestational Diabetes self-management. EDD 06/22/21; [redacted]w[redacted]d Patient states no history of GDM.   A1c 6.4% on 11/28/20  Diet history obtained. Patient eats variety of all food groups. Beverages include water.    The following learning objectives were met by the patient :   States the definition of Gestational Diabetes  States why dietary management is important in controlling blood glucose  Describes the effects of carbohydrates on blood glucose levels  Demonstrates ability to create a balanced meal plan  Demonstrates carbohydrate counting   States when to check blood glucose levels  Demonstrates proper blood glucose monitoring techniques  States the effect of stress and exercise on blood glucose levels  States the importance of limiting caffeine and abstaining from alcohol and smoking  Plan:  Aim for 3 Carbohydrate Choices per meal (45 grams) +/- 1 either way  Aim for 1-2 Carbohydrate Choices per snack Begin reading food labels for Total Carbohydrate of foods If OK with your MD, consider  increasing your activity level by walking, Arm Chair Exercises or other activity daily as tolerated Begin checking Blood Glucose before breakfast and 2 hours after first bite of breakfast, lunch and dinner as directed by MD  Bring Log Book/Sheet and meter to every medical appointment  Take medication if directed by MD  Blood glucose monitor given: Accu-chek Guide Me Lot [id number]: 2022/02/10 CBG: 116 mg/dL  Rx order placed for  Accu-chek Guide strips and Softclix lancets  Patient instructed to monitor glucose levels: FBS: 60 - 95 mg/dl 2 hour: <120 mg/dl  Patient received the following handouts:  Nutrition Diabetes and Pregnancy  Carbohydrate Counting List  Blood glucose Log Sheet  Patient will be seen for follow-up as needed.

## 2021-01-23 ENCOUNTER — Other Ambulatory Visit: Payer: Self-pay | Admitting: Obstetrics & Gynecology

## 2021-01-23 ENCOUNTER — Other Ambulatory Visit: Payer: Self-pay | Admitting: *Deleted

## 2021-01-23 ENCOUNTER — Ambulatory Visit: Payer: Medicaid Other | Attending: Obstetrics and Gynecology

## 2021-01-23 ENCOUNTER — Encounter: Payer: Self-pay | Admitting: *Deleted

## 2021-01-23 ENCOUNTER — Ambulatory Visit (HOSPITAL_BASED_OUTPATIENT_CLINIC_OR_DEPARTMENT_OTHER): Payer: Medicaid Other | Admitting: *Deleted

## 2021-01-23 ENCOUNTER — Other Ambulatory Visit: Payer: Self-pay

## 2021-01-23 DIAGNOSIS — O09299 Supervision of pregnancy with other poor reproductive or obstetric history, unspecified trimester: Secondary | ICD-10-CM | POA: Insufficient documentation

## 2021-01-23 DIAGNOSIS — Z8616 Personal history of COVID-19: Secondary | ICD-10-CM | POA: Insufficient documentation

## 2021-01-23 DIAGNOSIS — O0990 Supervision of high risk pregnancy, unspecified, unspecified trimester: Secondary | ICD-10-CM

## 2021-01-23 DIAGNOSIS — O09899 Supervision of other high risk pregnancies, unspecified trimester: Secondary | ICD-10-CM

## 2021-01-23 DIAGNOSIS — Z3A18 18 weeks gestation of pregnancy: Secondary | ICD-10-CM | POA: Insufficient documentation

## 2021-01-23 DIAGNOSIS — O99212 Obesity complicating pregnancy, second trimester: Secondary | ICD-10-CM

## 2021-01-23 DIAGNOSIS — O24410 Gestational diabetes mellitus in pregnancy, diet controlled: Secondary | ICD-10-CM | POA: Insufficient documentation

## 2021-01-23 DIAGNOSIS — R638 Other symptoms and signs concerning food and fluid intake: Secondary | ICD-10-CM

## 2021-01-23 DIAGNOSIS — O209 Hemorrhage in early pregnancy, unspecified: Secondary | ICD-10-CM

## 2021-01-23 DIAGNOSIS — O99210 Obesity complicating pregnancy, unspecified trimester: Secondary | ICD-10-CM

## 2021-01-23 DIAGNOSIS — O3432 Maternal care for cervical incompetence, second trimester: Secondary | ICD-10-CM

## 2021-01-23 NOTE — Progress Notes (Signed)
MFM Note  This patient was seen for a detailed fetal anatomy scan due to maternal obesity with a BMI of 61.  She has a history of cervical incompetence resulting in a prior 23-week delivery.  She had a cervical cerclage placed earlier in her current pregnancy.  Her current pregnancy has also been complicated by diet-controlled gestational diabetes.  She denies any other significant past medical history and denies any problems in her current pregnancy.    She had a cell free DNA test earlier in her pregnancy which indicated a low risk for trisomy 52, 17, and 13. A female fetus is predicted.   She was informed that the fetal growth and amniotic fluid level were appropriate for her gestational age.   The views of the fetal anatomy were extremely limited today due to extreme maternal body habitus.  The patient was informed that anomalies may be missed due to technical limitations. If the fetus is in a suboptimal position or maternal habitus is increased, visualization of the fetus in the maternal uterus may be impaired.  Due to her history of cervical incompetence, a transvaginal ultrasound was performed today showing a cervical length of 4.95 cm long without any signs of funneling.  The patient was reassured that her risk of a preterm birth is low at this time due to the normal cervical length noted today.  The implications and management of diabetes in pregnancy was discussed in detail with the patient. She was advised that our goals for her fingerstick values are fasting values of 90-95 or less and two-hour postprandials of 120 or less.  Should the majority of her fingerstick values be above these values, she may have to be started on insulin or metformin to help her achieve better glycemic control. The patient was advised that getting her fingerstick values as close to these goals as possible would provide her with the most optimal obstetrical outcome.  The patient was advised that due to diabetes  in pregnancy and maternal obesity, we will continue to follow her with monthly growth ultrasounds.  Due to early onset gestational diabetes versus pregestational diabetes, she was referred for a fetal echocardiogram with pediatric cardiology.  Weekly fetal testing should probably start at around 32 weeks.  A follow-up exam was scheduled in 4 weeks to complete the views of the fetal anatomy.   A total of 15 minutes was spent counseling and coordinating the care for this patient.  Greater than 50% of the time was spent in direct face-to-face contact.

## 2021-01-24 ENCOUNTER — Encounter: Payer: Self-pay | Admitting: Medical

## 2021-01-24 ENCOUNTER — Other Ambulatory Visit: Payer: Self-pay | Admitting: Lactation Services

## 2021-01-24 MED ORDER — ACCU-CHEK SOFTCLIX LANCETS MISC: 12 refills | Status: DC

## 2021-01-25 ENCOUNTER — Other Ambulatory Visit: Payer: Self-pay | Admitting: *Deleted

## 2021-01-25 DIAGNOSIS — O24419 Gestational diabetes mellitus in pregnancy, unspecified control: Secondary | ICD-10-CM

## 2021-01-26 ENCOUNTER — Other Ambulatory Visit: Payer: Self-pay

## 2021-01-27 ENCOUNTER — Telehealth: Payer: Self-pay

## 2021-01-27 NOTE — Telephone Encounter (Signed)
mar/fetal echo scheduled for: 03/16/2021@1045a .

## 2021-02-01 ENCOUNTER — Ambulatory Visit (INDEPENDENT_AMBULATORY_CARE_PROVIDER_SITE_OTHER): Payer: Medicaid Other | Admitting: Obstetrics and Gynecology

## 2021-02-01 ENCOUNTER — Other Ambulatory Visit: Payer: Self-pay

## 2021-02-01 ENCOUNTER — Encounter: Payer: Self-pay | Admitting: Obstetrics and Gynecology

## 2021-02-01 VITALS — BP 126/51 | HR 85 | Wt 363.0 lb

## 2021-02-01 DIAGNOSIS — O99210 Obesity complicating pregnancy, unspecified trimester: Secondary | ICD-10-CM

## 2021-02-01 DIAGNOSIS — O24410 Gestational diabetes mellitus in pregnancy, diet controlled: Secondary | ICD-10-CM

## 2021-02-01 DIAGNOSIS — N883 Incompetence of cervix uteri: Secondary | ICD-10-CM

## 2021-02-01 DIAGNOSIS — O0990 Supervision of high risk pregnancy, unspecified, unspecified trimester: Secondary | ICD-10-CM

## 2021-02-01 NOTE — Progress Notes (Signed)
   PRENATAL VISIT NOTE  Subjective:  Cynthia Valenzuela is a 25 y.o. G7P0150 at [redacted]w[redacted]d being seen today for ongoing prenatal care.  She is currently monitored for the following issues for this high-risk pregnancy and has Frequent headaches; Incompetent cervix; COVID-19; Supervision of high risk pregnancy, antepartum; History of incompetent cervix, currently pregnant; Obesity in pregnancy; Bleeding in early pregnancy; History of preterm delivery, currently pregnant; History of COVID-19; BMI 60.0-69.9, adult (HCC); and Gestational diabetes mellitus (GDM), antepartum on their problem list.  Patient reports no complaints.  Contractions: Not present. Vag. Bleeding: None.  Movement: Present. Denies leaking of fluid.   The following portions of the patient's history were reviewed and updated as appropriate: allergies, current medications, past family history, past medical history, past social history, past surgical history and problem list.   Objective:   Vitals:   02/01/21 1030  BP: (!) 126/51  Pulse: 85  Weight: (!) 363 lb (164.7 kg)    Fetal Status: Fetal Heart Rate (bpm): 155   Movement: Present     General:  Alert, oriented and cooperative. Patient is in no acute distress.  Skin: Skin is warm and dry. No rash noted.   Cardiovascular: Normal heart rate noted  Respiratory: Normal respiratory effort, no problems with respiration noted  Abdomen: Soft, gravid, appropriate for gestational age.  Pain/Pressure: Absent     Pelvic: Cervical exam deferred        Extremities: Normal range of motion.  Edema: None  Mental Status: Normal mood and affect. Normal behavior. Normal judgment and thought content.   Assessment and Plan:  Pregnancy: G7P0150 at [redacted]w[redacted]d 1. Diet controlled gestational diabetes mellitus (GDM), antepartum Patient did not bring log but showed yesterdays reading which were all within range Continue diet control Fetal echo scheduled in June  2. Supervision of high risk pregnancy,  antepartum Patient is doing well without complaints Follow up anatomy  3. Incompetent cervix S/p cerclage  4. Obesity in pregnancy   Preterm labor symptoms and general obstetric precautions including but not limited to vaginal bleeding, contractions, leaking of fluid and fetal movement were reviewed in detail with the patient. Please refer to After Visit Summary for other counseling recommendations.   Return in about 4 weeks (around 03/01/2021) for in person, ROB, High risk.  Future Appointments  Date Time Provider Department Center  02/20/2021 11:15 AM [facility] NURSE [facility]  02/20/2021 11:30 AM [facility] US3 [facility]    Catalina Antigua, MD

## 2021-02-20 ENCOUNTER — Ambulatory Visit: Payer: Medicaid Other | Attending: Obstetrics

## 2021-02-20 ENCOUNTER — Encounter: Payer: Self-pay | Admitting: *Deleted

## 2021-02-20 ENCOUNTER — Other Ambulatory Visit: Payer: Self-pay

## 2021-02-20 ENCOUNTER — Ambulatory Visit: Payer: Medicaid Other | Admitting: *Deleted

## 2021-02-20 ENCOUNTER — Other Ambulatory Visit: Payer: Self-pay | Admitting: *Deleted

## 2021-02-20 DIAGNOSIS — E669 Obesity, unspecified: Secondary | ICD-10-CM

## 2021-02-20 DIAGNOSIS — O99210 Obesity complicating pregnancy, unspecified trimester: Secondary | ICD-10-CM | POA: Insufficient documentation

## 2021-02-20 DIAGNOSIS — O322XX0 Maternal care for transverse and oblique lie, not applicable or unspecified: Secondary | ICD-10-CM

## 2021-02-20 DIAGNOSIS — O09299 Supervision of pregnancy with other poor reproductive or obstetric history, unspecified trimester: Secondary | ICD-10-CM

## 2021-02-20 DIAGNOSIS — Z6841 Body Mass Index (BMI) 40.0 and over, adult: Secondary | ICD-10-CM

## 2021-02-20 DIAGNOSIS — R638 Other symptoms and signs concerning food and fluid intake: Secondary | ICD-10-CM | POA: Diagnosis present

## 2021-02-20 DIAGNOSIS — O09899 Supervision of other high risk pregnancies, unspecified trimester: Secondary | ICD-10-CM | POA: Insufficient documentation

## 2021-02-20 DIAGNOSIS — O99212 Obesity complicating pregnancy, second trimester: Secondary | ICD-10-CM | POA: Diagnosis not present

## 2021-02-20 DIAGNOSIS — Z3A22 22 weeks gestation of pregnancy: Secondary | ICD-10-CM

## 2021-02-20 DIAGNOSIS — O24410 Gestational diabetes mellitus in pregnancy, diet controlled: Secondary | ICD-10-CM

## 2021-02-20 DIAGNOSIS — O209 Hemorrhage in early pregnancy, unspecified: Secondary | ICD-10-CM | POA: Diagnosis present

## 2021-02-20 DIAGNOSIS — Z362 Encounter for other antenatal screening follow-up: Secondary | ICD-10-CM | POA: Diagnosis not present

## 2021-02-20 DIAGNOSIS — O0990 Supervision of high risk pregnancy, unspecified, unspecified trimester: Secondary | ICD-10-CM | POA: Insufficient documentation

## 2021-02-20 DIAGNOSIS — Z8616 Personal history of COVID-19: Secondary | ICD-10-CM | POA: Insufficient documentation

## 2021-02-20 DIAGNOSIS — O3432 Maternal care for cervical incompetence, second trimester: Secondary | ICD-10-CM | POA: Diagnosis not present

## 2021-02-20 DIAGNOSIS — O09212 Supervision of pregnancy with history of pre-term labor, second trimester: Secondary | ICD-10-CM

## 2021-02-20 DIAGNOSIS — O2622 Pregnancy care for patient with recurrent pregnancy loss, second trimester: Secondary | ICD-10-CM

## 2021-03-01 ENCOUNTER — Ambulatory Visit (INDEPENDENT_AMBULATORY_CARE_PROVIDER_SITE_OTHER): Payer: Medicaid Other | Admitting: Obstetrics and Gynecology

## 2021-03-01 ENCOUNTER — Other Ambulatory Visit: Payer: Self-pay

## 2021-03-01 VITALS — BP 129/86 | HR 101 | Wt 363.4 lb

## 2021-03-01 DIAGNOSIS — Z6841 Body Mass Index (BMI) 40.0 and over, adult: Secondary | ICD-10-CM

## 2021-03-01 DIAGNOSIS — O99210 Obesity complicating pregnancy, unspecified trimester: Secondary | ICD-10-CM

## 2021-03-01 DIAGNOSIS — Z3A23 23 weeks gestation of pregnancy: Secondary | ICD-10-CM

## 2021-03-01 DIAGNOSIS — O3430 Maternal care for cervical incompetence, unspecified trimester: Secondary | ICD-10-CM

## 2021-03-01 DIAGNOSIS — O24410 Gestational diabetes mellitus in pregnancy, diet controlled: Secondary | ICD-10-CM

## 2021-03-01 DIAGNOSIS — O0990 Supervision of high risk pregnancy, unspecified, unspecified trimester: Secondary | ICD-10-CM

## 2021-03-02 ENCOUNTER — Encounter: Payer: Self-pay | Admitting: Obstetrics and Gynecology

## 2021-03-02 DIAGNOSIS — O3430 Maternal care for cervical incompetence, unspecified trimester: Secondary | ICD-10-CM | POA: Insufficient documentation

## 2021-03-02 NOTE — Progress Notes (Signed)
   PRENATAL VISIT NOTE  Subjective:  Cynthia Valenzuela is a 25 y.o. G7P0150 at [redacted]w[redacted]d being seen today for ongoing prenatal care.  She is currently monitored for the following issues for this high-risk pregnancy and has Frequent headaches; Incompetent cervix; Supervision of high risk pregnancy, antepartum; History of incompetent cervix, currently pregnant; Obesity in pregnancy; Bleeding in early pregnancy; History of preterm delivery, currently pregnant; BMI 60.0-69.9, adult (HCC); Gestational diabetes mellitus (GDM), antepartum; and Cervical cerclage suture present, antepartum on their problem list.  Patient reports no complaints.  Contractions: Not present. Vag. Bleeding: None.  Movement: Present. Denies leaking of fluid.   The following portions of the patient's history were reviewed and updated as appropriate: allergies, current medications, past family history, past medical history, past social history, past surgical history and problem list.   Objective:   Vitals:   03/01/21 1048  BP: 129/86  Pulse: (!) 101  Weight: (!) 363 lb 6.4 oz (164.8 kg)    Fetal Status: Fetal Heart Rate (bpm): 170   Movement: Present     General:  Alert, oriented and cooperative. Patient is in no acute distress.  Skin: Skin is warm and dry. No rash noted.   Cardiovascular: Normal heart rate noted  Respiratory: Normal respiratory effort, no problems with respiration noted  Abdomen: Soft, gravid, appropriate for gestational age.  Pain/Pressure: Absent     Pelvic: Cervical exam deferred        Extremities: Normal range of motion.  Edema: None  Mental Status: Normal mood and affect. Normal behavior. Normal judgment and thought content.   Assessment and Plan:  Pregnancy: G7P0150 at [redacted]w[redacted]d 1. Cervical cerclage suture present, antepartum 14wk ppx cerclage in place. I d/w her I recommend pelvic rest until cerclage out at 36-37wk   2. Diet controlled gestational diabetes mellitus (GDM), antepartum Normal CBG  log with a few am fastings in the high 90s. Pt doesn't snack. Will continue to follow closely F/u 6/9 fetal echo with [facility]  3. Supervision of high risk pregnancy, antepartum 5/16: 32%, 496gm, ac 50%, normal afi F/u 6/13 rpt growth  4. Obesity in pregnancy Weight stable  5. BMI 60.0-69.9, adult (HCC)  Preterm labor symptoms and general obstetric precautions including but not limited to vaginal bleeding, contractions, leaking of fluid and fetal movement were reviewed in detail with the patient. Please refer to After Visit Summary for other counseling recommendations.   Return in about 19 days (around 03/20/2021) for in person, high risk ob, md visit.  Future Appointments  Date Time Provider Department Center  03/20/2021 11:15 AM [facility]SE [facility]  03/20/2021 11:30 AM [facility] US3 [facility]    [place], MD

## 2021-03-09 ENCOUNTER — Other Ambulatory Visit: Payer: Self-pay

## 2021-03-09 ENCOUNTER — Encounter (HOSPITAL_COMMUNITY): Payer: Self-pay | Admitting: Obstetrics and Gynecology

## 2021-03-09 ENCOUNTER — Inpatient Hospital Stay (HOSPITAL_COMMUNITY)
Admission: AD | Admit: 2021-03-09 | Discharge: 2021-03-09 | Disposition: A | Discharge status: Home / Self Care | Payer: Medicaid Other | Attending: Obstetrics and Gynecology | Admitting: Obstetrics and Gynecology

## 2021-03-09 DIAGNOSIS — R519 Headache, unspecified: Secondary | ICD-10-CM | POA: Insufficient documentation

## 2021-03-09 DIAGNOSIS — O26892 Other specified pregnancy related conditions, second trimester: Secondary | ICD-10-CM | POA: Insufficient documentation

## 2021-03-09 DIAGNOSIS — Z3689 Encounter for other specified antenatal screening: Secondary | ICD-10-CM

## 2021-03-09 DIAGNOSIS — Z3A25 25 weeks gestation of pregnancy: Secondary | ICD-10-CM | POA: Diagnosis not present

## 2021-03-09 DIAGNOSIS — Z7982 Long term (current) use of aspirin: Secondary | ICD-10-CM | POA: Diagnosis not present

## 2021-03-09 DIAGNOSIS — Z8616 Personal history of COVID-19: Secondary | ICD-10-CM | POA: Diagnosis not present

## 2021-03-09 DIAGNOSIS — O99891 Other specified diseases and conditions complicating pregnancy: Secondary | ICD-10-CM | POA: Diagnosis not present

## 2021-03-09 DIAGNOSIS — O99212 Obesity complicating pregnancy, second trimester: Secondary | ICD-10-CM | POA: Diagnosis not present

## 2021-03-09 DIAGNOSIS — Z3A01 Less than 8 weeks gestation of pregnancy: Secondary | ICD-10-CM

## 2021-03-09 DIAGNOSIS — R030 Elevated blood-pressure reading, without diagnosis of hypertension: Secondary | ICD-10-CM | POA: Diagnosis not present

## 2021-03-09 DIAGNOSIS — Z87891 Personal history of nicotine dependence: Secondary | ICD-10-CM | POA: Insufficient documentation

## 2021-03-09 DIAGNOSIS — O09899 Supervision of other high risk pregnancies, unspecified trimester: Secondary | ICD-10-CM

## 2021-03-09 DIAGNOSIS — O209 Hemorrhage in early pregnancy, unspecified: Secondary | ICD-10-CM

## 2021-03-09 DIAGNOSIS — O0990 Supervision of high risk pregnancy, unspecified, unspecified trimester: Secondary | ICD-10-CM

## 2021-03-09 DIAGNOSIS — O09299 Supervision of pregnancy with other poor reproductive or obstetric history, unspecified trimester: Secondary | ICD-10-CM

## 2021-03-09 DIAGNOSIS — O99210 Obesity complicating pregnancy, unspecified trimester: Secondary | ICD-10-CM

## 2021-03-09 LAB — CBC
HCT: 34.4 % — ABNORMAL LOW (ref 36.0–46.0)
Hemoglobin: 11.3 g/dL — ABNORMAL LOW (ref 12.0–15.0)
MCH: 28.5 pg (ref 26.0–34.0)
MCHC: 32.8 g/dL (ref 30.0–36.0)
MCV: 86.9 fL (ref 80.0–100.0)
Platelets: 406 10*3/uL — ABNORMAL HIGH (ref 150–400)
RBC: 3.96 MIL/uL (ref 3.87–5.11)
RDW: 14.4 % (ref 11.5–15.5)
WBC: 8.8 10*3/uL (ref 4.0–10.5)
nRBC: 0 % (ref 0.0–0.2)

## 2021-03-09 LAB — COMPREHENSIVE METABOLIC PANEL
ALT: 18 U/L (ref 0–44)
AST: 16 U/L (ref 15–41)
Albumin: 2.9 g/dL — ABNORMAL LOW (ref 3.5–5.0)
Alkaline Phosphatase: 87 U/L (ref 38–126)
Anion gap: 8 (ref 5–15)
BUN: <5 mg/dL — ABNORMAL LOW (ref 6–20)
CO2: 24 mmol/L (ref 22–32)
Calcium: 9.2 mg/dL (ref 8.9–10.3)
Chloride: 104 mmol/L (ref 98–111)
Creatinine, Ser: 0.49 mg/dL (ref 0.44–1.00)
GFR, Estimated: >60 mL/min (ref >60)
Glucose, Bld: 101 mg/dL — ABNORMAL HIGH (ref 70–99)
Potassium: 3.7 mmol/L (ref 3.5–5.1)
Sodium: 136 mmol/L (ref 135–145)
Total Bilirubin: 0.5 mg/dL (ref 0.3–1.2)
Total Protein: 6.4 g/dL — ABNORMAL LOW (ref 6.5–8.1)

## 2021-03-09 LAB — PROTEIN / CREATININE RATIO, URINE
Creatinine, Urine: 48.51 mg/dL
Total Protein, Urine: <6 mg/dL

## 2021-03-09 NOTE — MAU Provider Note (Signed)
History     CSN: [id number]  Arrival date and time: 03/09/21 2052   Event Date/Time   First Provider Initiated Contact with Patient 03/09/21 2215      Chief Complaint  Patient presents with  . Headache   Cynthia Valenzuela is a 25 y.o. [id number] at 64w0dwho [facility]  She presents today for Headache and elevated bp.  Patient states she took her BP at home and it was 150s/60s.  She reports she has been having a headache for the past 2 days, but it is intermittent in nature.  Patient reports that when it is present it is a 8/10.  However, patient states she odes not have a headache currently.  Patient endorses fetal movement and denies abdominal cramping or contractions.  Patient also denies visual disturbances or RUQ pain.    OB History    Gravida  7   Para  1   Term  0   Preterm  1   AB  5   Living  0     SAB  4   IAB  1   Ectopic  0   Multiple  0   Live Births  1        Obstetric Comments  Preterm child deceased due to loss from incompetent cervix (Had cerclage)        Past Medical History:  Diagnosis Date  . COVID-19 08/2019  . Headache   . Infection    UTI  . Obesities, morbid (HFisher   . Preterm labor     Past Surgical History:  Procedure Laterality Date  . CERVICAL CERCLAGE    . CERVICAL CERCLAGE N/A 12/26/2020   Procedure: CERCLAGE CERVICAL;  Surgeon: CMora Bellman MD;  Location: [facility] LD [facility]S;  Service: Gynecology;  Laterality: N/A;  . WISDOM TOOTH EXTRACTION      Family History  Problem Relation Age of Onset  . Alcohol abuse Father   . Diabetes Mother   . Diabetes Sister     Social History   Tobacco Use  . Smoking status: Former Smoker    Types: Cigarettes    Quit date: 11/21/2018    Years since quitting: 2.2  . Smokeless tobacco: Never Used  . Tobacco comment: vapes  Vaping Use  . Vaping Use: Former  . Quit date: 11/08/2020  . Substances: Nicotine, Flavoring  Substance Use Topics  . Alcohol use: Not Currently     Comment: occasionally, weekends  . Drug use: Not Currently    Types: Marijuana    Comment: 04/22/2015 quit using marijuana     Allergies: No Known Allergies  Medications Prior to Admission  Medication Sig Dispense Refill Last Dose  . acetaminophen (TYLENOL) 500 MG tablet Take 500-1,000 mg by mouth every 6 (six) hours as needed (pain).   03/09/2021 at Unknown time  . aspirin 81 MG chewable tablet Chew 1 tablet (81 mg total) by mouth daily. 30 tablet 6 03/09/2021 at Unknown time  . cetirizine (ZYRTEC) 10 MG tablet Take 10 mg by mouth daily.   Past Month at Unknown time  . Prenatal Vit-Fe Fumarate-FA (PRENATAL VITAMIN PO) Take 1 tablet by mouth in the morning.   03/09/2021 at Unknown time  . Accu-Chek Softclix Lancets lancets To check blood sugars 4 times a day. 100 each 12   . Blood Pressure Monitoring (BLOOD PRESSURE KIT) DEVI 1 Device by Does not apply route as needed. 1 each 0   . docusate sodium (COLACE) 100 MG capsule  Take 1 capsule (100 mg total) by mouth 2 (two) times daily as needed. (Patient not taking: Reported on 03/01/2021) 30 capsule 2   . glucose blood test strip 1 each by Other route 4 (four) times daily. Use as instructed 100 each 4     Review of Systems  Eyes: Negative for visual disturbance.  Gastrointestinal: Negative for abdominal pain, nausea and vomiting.  Genitourinary: Negative for vaginal bleeding and vaginal discharge.  Neurological: Positive for headaches ("On and off," none currently ). Negative for dizziness and light-headedness.   Physical Exam   Blood pressure 137/80, pulse 93, temperature 98.5 F (36.9 C), temperature source Oral, resp. rate 19, height $RemoveBe'5\' 5"'NzzKPtpzO$  (1.651 m), weight (!) 166.9 kg, last menstrual period 09/07/2020, SpO2 100 %, unknown if currently breastfeeding.  Vitals:   03/09/21 2125 03/09/21 2130 03/09/21 2131 03/09/21 2135  BP:   137/80   Pulse:   93   Resp:      Temp:      TempSrc:      SpO2: 99% 99%  100%  Weight:      Height:          Physical Exam Constitutional:      General: She is not in acute distress.    Appearance: She is well-developed. She is obese. She is not toxic-appearing.  HENT:     Head: Normocephalic and atraumatic.  Eyes:     Conjunctiva/sclera: Conjunctivae normal.  Cardiovascular:     Rate and Rhythm: Normal rate.  Pulmonary:     Effort: Pulmonary effort is normal. No respiratory distress.  Abdominal:     Palpations: Abdomen is soft.     Tenderness: There is no abdominal tenderness.  Musculoskeletal:        General: Normal range of motion.     Cervical back: Normal range of motion.  Skin:    General: Skin is warm and dry.  Neurological:     Mental Status: She is alert and oriented to person, place, and time.  Psychiatric:        Mood and Affect: Mood normal.        Behavior: Behavior normal.     Fetal Assessment 155 bpm, Mod Var, -Decels, +10x10 Accels Toco: Not applied  MAU Course   Results for orders placed or performed during the hospital encounter of 03/09/21 (from the past 24 hour(s))  Protein / creatinine ratio, urine     Status: None   Collection Time: 03/09/21  9:17 PM  Result Value Ref Range   Creatinine, Urine 48.51 mg/dL   Total Protein, Urine <6 mg/dL   Protein Creatinine Ratio        0.00 - 0.15 mg/mg[Cre]  CBC     Status: Abnormal   Collection Time: 03/09/21  9:22 PM  Result Value Ref Range   WBC 8.8 4.0 - 10.5 K/uL   RBC 3.96 3.87 - 5.11 MIL/uL   Hemoglobin 11.3 (L) 12.0 - 15.0 g/dL   HCT 34.4 (L) 36.0 - 46.0 %   MCV 86.9 80.0 - 100.0 fL   MCH 28.5 26.0 - 34.0 pg   MCHC 32.8 30.0 - 36.0 g/dL   RDW 14.4 11.5 - 15.5 %   Platelets 406 (H) 150 - 400 K/uL   nRBC 0.0 0.0 - 0.2 %  Comprehensive metabolic panel     Status: Abnormal   Collection Time: 03/09/21  9:22 PM  Result Value Ref Range   Sodium 136 135 - 145 mmol/L   Potassium 3.7  3.5 - 5.1 mmol/L   Chloride 104 98 - 111 mmol/L   CO2 24 22 - 32 mmol/L   Glucose, Bld 101 (H) 70 - 99 mg/dL   BUN <5  (L) 6 - 20 mg/dL   Creatinine, Ser 0.49 0.44 - 1.00 mg/dL   Calcium 9.2 8.9 - 10.3 mg/dL   Total Protein 6.4 (L) 6.5 - 8.1 g/dL   Albumin 2.9 (L) 3.5 - 5.0 g/dL   AST 16 15 - 41 U/L   ALT 18 0 - 44 U/L   Alkaline Phosphatase 87 38 - 126 U/L   Total Bilirubin 0.5 0.3 - 1.2 mg/dL   GFR, Estimated >60 >60 mL/min   Anion gap 8 5 - 15   No results found.  MDM Physical Exam Labs: CBC, CMP, PC Ratio Measure BPQ15 min EFM Assessment and Plan  25 year old G7P0150  SIUP at 25 weeks Elevated BP  Morbidly Obese Unable to obtain FHT Headache-Intermittent  -Fetal heart tones obtained by [facility] and doppler placed. -Patient informed of POC.  -Discussed why labs are being collected. -Reassured that blood pressures are normotensive, but will continue to monitor. -Instructed to continue to monitor once a week at home. -Patient reports next appt for Fetal Echo and next OB appt on June 14th. -Instructed to inform provider if HA returns.  -Will await results and reassess.  Maryann Conners MSN, CNM 03/09/2021, 10:15 PM   Reassessment (10:52 PM)  Vitals:   03/09/21 2200 03/09/21 2201 03/09/21 2215 03/09/21 2216  BP:  112/77  130/73  Pulse:  90  82  Resp:      Temp:      TempSrc:      SpO2: 100%  99%   Weight:      Height:        -Labs return without significant findings. -BP remain normotensive -NST reactive for GA -Encouraged to call or return to MAU if symptoms worsen or with the onset of new symptoms. -Discharged to home in stable condition.  Maryann Conners  MSN, CNM Advanced Practice Provider, Center for [employer]

## 2021-03-09 NOTE — MAU Note (Signed)
Pt reports b/p check at home 152/65, headache off/on x 2 days.

## 2021-03-09 NOTE — Discharge Instructions (Signed)
How to Take Your Blood Pressure Blood pressure is a measurement of how strongly your blood is pressing against the walls of your arteries. Arteries are blood vessels that carry blood from your heart throughout your body. Your health care provider takes your blood pressure at each office visit. You can also take your own blood pressure at home with a blood pressure monitor. You may need to take your own blood pressure to:  Confirm a diagnosis of high blood pressure (hypertension).  Monitor your blood pressure over time.  Make sure your blood pressure medicine is working. Supplies needed:  Blood pressure monitor.  Dining room chair to sit in.  Table or desk.  Small notebook and pencil or pen. How to prepare To get the most accurate reading, avoid the following for 30 minutes before you check your blood pressure:  Drinking caffeine.  Drinking alcohol.  Eating.  Smoking.  Exercising. Five minutes before you check your blood pressure:  Use the bathroom and urinate so that you have an empty bladder.  Sit quietly in a dining room chair. Do not sit in a soft couch or an armchair. Do not talk. How to take your blood pressure To check your blood pressure, follow the instructions in the manual that came with your blood pressure monitor. If you have a digital blood pressure monitor, the instructions may be as follows: 1. Sit up straight in a chair. 2. Place your feet on the floor. Do not cross your ankles or legs. 3. Rest your left arm at the level of your heart on a table or desk or on the arm of a chair. 4. Pull up your shirt sleeve. 5. Wrap the blood pressure cuff around the upper part of your left arm, 1 inch (2.5 cm) above your elbow. It is best to wrap the cuff around bare skin. 6. Fit the cuff snugly around your arm. You should be able to place only one finger between the cuff and your arm. 7. Position the cord so that it rests in the bend of your elbow. 8. Press the power  button. 9. Sit quietly while the cuff inflates and deflates. 10. Read the digital reading on the monitor screen and write the numbers down (record them) in a notebook. 11. Wait 2-3 minutes, then repeat the steps, starting at step 1.   What does my blood pressure reading mean? A blood pressure reading consists of a higher number over a lower number. Ideally, your blood pressure should be below 120/80. The first ("top") number is called the systolic pressure. It is a measure of the pressure in your arteries as your heart beats. The second ("bottom") number is called the diastolic pressure. It is a measure of the pressure in your arteries as the heart relaxes. Blood pressure is classified into five stages. The following are the stages for adults who do not have a short-term serious illness or a chronic condition. Systolic pressure and diastolic pressure are measured in a unit called mm Hg (millimeters of mercury).  Normal  Systolic pressure: below 120.  Diastolic pressure: below 80. Elevated  Systolic pressure: 120-129.  Diastolic pressure: below 80. Hypertension stage 1  Systolic pressure: 130-139.  Diastolic pressure: 80-89. Hypertension stage 2  Systolic pressure: 140 or above.  Diastolic pressure: 90 or above. You can have elevated blood pressure or hypertension even if only the systolic or only the diastolic number in your reading is higher than normal. Follow these instructions at home:  Check your blood   pressure as often as recommended by your health care provider.  Check your blood pressure at the same time every day.  Take your monitor to the next appointment with your health care provider to make sure that: ? You are using it correctly. ? It provides accurate readings.  Be sure you understand what your goal blood pressure numbers are.  Tell your health care provider if you are having any side effects from blood pressure medicine.  Keep all follow-up visits as told by  your health care provider. This is important. General tips  Your health care provider can suggest a reliable monitor that will meet your needs. There are several types of home blood pressure monitors.  Choose a monitor that has an arm cuff. Do not choose a monitor that measures your blood pressure from your wrist or finger.  Choose a cuff that wraps snugly around your upper arm. You should be able to fit only one finger between your arm and the cuff.  You can buy a blood pressure monitor at most drugstores or online. Where to find more information American Heart Association: www.heart.org Contact a health care provider if:  Your blood pressure is consistently high. Get help right away if:  Your systolic blood pressure is higher than 180.  Your diastolic blood pressure is higher than 120. Summary  Blood pressure is a measurement of how strongly your blood is pressing against the walls of your arteries.  A blood pressure reading consists of a higher number over a lower number. Ideally, your blood pressure should be below 120/80.  Check your blood pressure at the same time every day.  Avoid caffeine, alcohol, smoking, and exercise for 30 minutes prior to checking your blood pressure. These agents can affect the accuracy of the blood pressure reading. This information is not intended to replace advice given to you by your health care provider. Make sure you discuss any questions you have with your health care provider. Document Revised: 09/18/2019 Document Reviewed: 09/18/2019 Elsevier Patient Education  2021 Elsevier Inc.  

## 2021-03-20 ENCOUNTER — Other Ambulatory Visit: Payer: Self-pay

## 2021-03-20 ENCOUNTER — Ambulatory Visit: Payer: Medicaid Other | Attending: Obstetrics

## 2021-03-20 ENCOUNTER — Ambulatory Visit: Payer: Medicaid Other | Admitting: *Deleted

## 2021-03-20 ENCOUNTER — Encounter: Payer: Self-pay | Admitting: *Deleted

## 2021-03-20 VITALS — BP 121/53 | HR 94

## 2021-03-20 DIAGNOSIS — O209 Hemorrhage in early pregnancy, unspecified: Secondary | ICD-10-CM

## 2021-03-20 DIAGNOSIS — O09212 Supervision of pregnancy with history of pre-term labor, second trimester: Secondary | ICD-10-CM

## 2021-03-20 DIAGNOSIS — O3432 Maternal care for cervical incompetence, second trimester: Secondary | ICD-10-CM

## 2021-03-20 DIAGNOSIS — O99212 Obesity complicating pregnancy, second trimester: Secondary | ICD-10-CM | POA: Insufficient documentation

## 2021-03-20 DIAGNOSIS — O2622 Pregnancy care for patient with recurrent pregnancy loss, second trimester: Secondary | ICD-10-CM | POA: Diagnosis not present

## 2021-03-20 DIAGNOSIS — Z3A26 26 weeks gestation of pregnancy: Secondary | ICD-10-CM | POA: Diagnosis not present

## 2021-03-20 DIAGNOSIS — O09292 Supervision of pregnancy with other poor reproductive or obstetric history, second trimester: Secondary | ICD-10-CM | POA: Insufficient documentation

## 2021-03-20 DIAGNOSIS — O24410 Gestational diabetes mellitus in pregnancy, diet controlled: Secondary | ICD-10-CM | POA: Diagnosis not present

## 2021-03-20 DIAGNOSIS — O0990 Supervision of high risk pregnancy, unspecified, unspecified trimester: Secondary | ICD-10-CM

## 2021-03-20 DIAGNOSIS — O09299 Supervision of pregnancy with other poor reproductive or obstetric history, unspecified trimester: Secondary | ICD-10-CM

## 2021-03-20 DIAGNOSIS — Z362 Encounter for other antenatal screening follow-up: Secondary | ICD-10-CM

## 2021-03-20 DIAGNOSIS — E669 Obesity, unspecified: Secondary | ICD-10-CM

## 2021-03-20 DIAGNOSIS — Z6841 Body Mass Index (BMI) 40.0 and over, adult: Secondary | ICD-10-CM

## 2021-03-20 DIAGNOSIS — O99210 Obesity complicating pregnancy, unspecified trimester: Secondary | ICD-10-CM

## 2021-03-20 DIAGNOSIS — O09899 Supervision of other high risk pregnancies, unspecified trimester: Secondary | ICD-10-CM

## 2021-03-20 NOTE — Progress Notes (Signed)
C/o" H/A intermittent x 2 weeks-not relieved by Tylenol." Has OB MD appointment 03/21/21

## 2021-03-21 ENCOUNTER — Encounter: Payer: Self-pay | Admitting: Family Medicine

## 2021-03-21 ENCOUNTER — Other Ambulatory Visit: Payer: Self-pay | Admitting: *Deleted

## 2021-03-21 ENCOUNTER — Ambulatory Visit (INDEPENDENT_AMBULATORY_CARE_PROVIDER_SITE_OTHER): Payer: Medicaid Other | Admitting: Family Medicine

## 2021-03-21 VITALS — BP 139/85 | HR 92 | Wt 367.9 lb

## 2021-03-21 DIAGNOSIS — O3430 Maternal care for cervical incompetence, unspecified trimester: Secondary | ICD-10-CM

## 2021-03-21 DIAGNOSIS — R030 Elevated blood-pressure reading, without diagnosis of hypertension: Secondary | ICD-10-CM | POA: Insufficient documentation

## 2021-03-21 DIAGNOSIS — Z6841 Body Mass Index (BMI) 40.0 and over, adult: Secondary | ICD-10-CM

## 2021-03-21 DIAGNOSIS — O24410 Gestational diabetes mellitus in pregnancy, diet controlled: Secondary | ICD-10-CM

## 2021-03-21 DIAGNOSIS — O0990 Supervision of high risk pregnancy, unspecified, unspecified trimester: Secondary | ICD-10-CM

## 2021-03-21 MED ORDER — CYCLOBENZAPRINE HCL 10 MG PO TABS: 10.0000 mg | ORAL_TABLET | Freq: Three times a day (TID) | ORAL | 1 refills | Status: DC | PRN

## 2021-03-21 NOTE — Progress Notes (Signed)
   Subjective:  Cynthia Valenzuela is a 25 y.o. G7P0150 at [redacted]w[redacted]d being seen today for ongoing prenatal care.  She is currently monitored for the following issues for this high-risk pregnancy and has Frequent headaches; Incompetent cervix; Supervision of high risk pregnancy, antepartum; History of incompetent cervix, currently pregnant; Obesity in pregnancy; Bleeding in early pregnancy; History of preterm delivery, currently pregnant; BMI 60.0-69.9, adult (HCC); Gestational diabetes mellitus (GDM), antepartum; Cervical cerclage suture present, antepartum; and Elevated BP without diagnosis of hypertension on their problem list.  Patient reports headache.  Contractions: Not present. Vag. Bleeding: None.  Movement: Present. Denies leaking of fluid.   The following portions of the patient's history were reviewed and updated as appropriate: allergies, current medications, past family history, past medical history, past social history, past surgical history and problem list. Problem list updated.  Objective:   Vitals:   03/21/21 0832  BP: 139/85  Pulse: 92  Weight: (!) 367 lb 14.4 oz (166.9 kg)    Fetal Status: Fetal Heart Rate (bpm): 149   Movement: Present     General:  Alert, oriented and cooperative. Patient is in no acute distress.  Skin: Skin is warm and dry. No rash noted.   Cardiovascular: Normal heart rate noted  Respiratory: Normal respiratory effort, no problems with respiration noted  Abdomen: Soft, gravid, appropriate for gestational age. Pain/Pressure: Absent     Pelvic: Vag. Bleeding: None     Cervical exam deferred        Extremities: Normal range of motion.  Edema: None  Mental Status: Normal mood and affect. Normal behavior. Normal judgment and thought content.   Urinalysis:      Assessment and Plan:  Pregnancy: G7P0150 at [redacted]w[redacted]d  1. Supervision of high risk pregnancy, antepartum BP normal but borderine, see below  Having headaches, pulsating on L side, trial  flexeril FHR normal Discussed contraception, would like post placental IUD  2. Diet controlled gestational diabetes mellitus (GDM), antepartum No log, has pictures but per date on machine has only checked twice in the past week Emphasized importance of checking sugars Also ran out of lancets, getting refilled tomorrow Growth scan normal yesterday, plan to start antenatal testing at 32 weeks  3. Cervical cerclage suture present, antepartum Placed at 14 wks due to hx of 23wk loss  4. BMI 60.0-69.9, adult (HCC)   5. Elevated blood pressure without diagnosis of hypertension Review of chart shows elevated in MAU on 03/10/21 with mild range BP's, taking ASA Reports more elevated BP's at home Extensive review of chart shows these are first elevated BP's she has   Preterm labor symptoms and general obstetric precautions including but not limited to vaginal bleeding, contractions, leaking of fluid and fetal movement were reviewed in detail with the patient. Please refer to After Visit Summary for other counseling recommendations.  Return in 2 weeks (on 04/04/2021) for [facility], ob visit, needs MD.   Venora Maples, MD

## 2021-03-21 NOTE — Patient Instructions (Signed)

## 2021-03-24 LAB — URINE CULTURE, OB REFLEX

## 2021-03-24 LAB — CULTURE, OB URINE

## 2021-04-05 ENCOUNTER — Ambulatory Visit (INDEPENDENT_AMBULATORY_CARE_PROVIDER_SITE_OTHER): Payer: Medicaid Other | Admitting: Family Medicine

## 2021-04-05 ENCOUNTER — Other Ambulatory Visit: Payer: Self-pay

## 2021-04-05 VITALS — BP 90/57 | HR 90 | Wt 364.2 lb

## 2021-04-05 DIAGNOSIS — O24410 Gestational diabetes mellitus in pregnancy, diet controlled: Secondary | ICD-10-CM

## 2021-04-05 DIAGNOSIS — O0990 Supervision of high risk pregnancy, unspecified, unspecified trimester: Secondary | ICD-10-CM

## 2021-04-05 DIAGNOSIS — Z6841 Body Mass Index (BMI) 40.0 and over, adult: Secondary | ICD-10-CM

## 2021-04-05 DIAGNOSIS — O09299 Supervision of pregnancy with other poor reproductive or obstetric history, unspecified trimester: Secondary | ICD-10-CM

## 2021-04-05 DIAGNOSIS — O3430 Maternal care for cervical incompetence, unspecified trimester: Secondary | ICD-10-CM

## 2021-04-05 DIAGNOSIS — Z3A28 28 weeks gestation of pregnancy: Secondary | ICD-10-CM

## 2021-04-05 DIAGNOSIS — O09899 Supervision of other high risk pregnancies, unspecified trimester: Secondary | ICD-10-CM

## 2021-04-05 LAB — POCT URINALYSIS DIP (DEVICE)
Bilirubin Urine: NEGATIVE
Glucose, UA: NEGATIVE mg/dL
Hgb urine dipstick: NEGATIVE
Ketones, ur: 15 mg/dL — AB
Leukocytes,Ua: NEGATIVE
Nitrite: NEGATIVE
Protein, ur: NEGATIVE mg/dL
Specific Gravity, Urine: 1.025 (ref 1.005–1.030)
Urobilinogen, UA: 0.2 mg/dL (ref 0.0–1.0)
pH: 7 (ref 5.0–8.0)

## 2021-04-05 NOTE — Progress Notes (Signed)
   PRENATAL VISIT NOTE  Subjective:  Cynthia Valenzuela is a 25 y.o. G7P0150 at [redacted]w[redacted]d being seen today for ongoing prenatal care.  She is currently monitored for the following issues for this high-risk pregnancy and has Frequent headaches; Incompetent cervix; Supervision of high risk pregnancy, antepartum; History of incompetent cervix, currently pregnant; Obesity in pregnancy; Bleeding in early pregnancy; History of preterm delivery, currently pregnant; BMI 60.0-69.9, adult (HCC); Gestational diabetes mellitus (GDM), antepartum; Cervical cerclage suture present, antepartum; and Elevated BP without diagnosis of hypertension on their problem list.  Patient reports no complaints.  Contractions: Not present. Vag. Bleeding: None.  Movement: Present. Denies leaking of fluid.   The following portions of the patient's history were reviewed and updated as appropriate: allergies, current medications, past family history, past medical history, past social history, past surgical history and problem list.   Objective:   Vitals:   04/05/21 1125  BP: (!) 90/57  Pulse: 90  Weight: (!) 364 lb 3.2 oz (165.2 kg)    Fetal Status: Fetal Heart Rate (bpm): 155   Movement: Present     General:  Alert, oriented and cooperative. Patient is in no acute distress.  Skin: Skin is warm and dry. No rash noted.   Cardiovascular: Normal heart rate noted  Respiratory: Normal respiratory effort, no problems with respiration noted  Abdomen: Soft, gravid, appropriate for gestational age.  Pain/Pressure: Absent     Pelvic: Cervical exam deferred        Extremities: Normal range of motion.  Edema: None  Mental Status: Normal mood and affect. Normal behavior. Normal judgment and thought content.   Assessment and Plan:  Pregnancy: G7P0150 at [redacted]w[redacted]d 1. Supervision of high risk pregnancy, antepartum  2. [redacted] weeks gestation of pregnancy  3. Diet controlled gestational diabetes mellitus (GDM), antepartum Still a little  inconsistent with checking. Discussed writing numbers down on piece of paper, eating at consistent times, etc. Continue diet controlled.  4. BMI 60.0-69.9, adult (HCC)  5. History of incompetent cervix, currently pregnant Cerclage in place  6. History of preterm delivery, currently pregnant Due to cervical incompetance.  7. Cervical cerclage suture present, antepartum   Preterm labor symptoms and general obstetric precautions including but not limited to vaginal bleeding, contractions, leaking of fluid and fetal movement were reviewed in detail with the patient. Please refer to After Visit Summary for other counseling recommendations.   No follow-ups on file.  Future Appointments  Date Time Provider Department Center  04/18/2021 11:15 AM [facility] NURSE [facility]  04/18/2021 11:30 AM [facility]3 [facility]    Levie Heritage, DO

## 2021-04-18 ENCOUNTER — Other Ambulatory Visit: Payer: Self-pay | Admitting: *Deleted

## 2021-04-18 ENCOUNTER — Other Ambulatory Visit: Payer: Self-pay

## 2021-04-18 ENCOUNTER — Ambulatory Visit: Payer: Medicaid Other | Attending: Obstetrics

## 2021-04-18 ENCOUNTER — Encounter: Payer: Self-pay | Admitting: *Deleted

## 2021-04-18 ENCOUNTER — Ambulatory Visit: Payer: Medicaid Other | Admitting: *Deleted

## 2021-04-18 VITALS — BP 120/66 | HR 88

## 2021-04-18 DIAGNOSIS — O09899 Supervision of other high risk pregnancies, unspecified trimester: Secondary | ICD-10-CM

## 2021-04-18 DIAGNOSIS — O09299 Supervision of pregnancy with other poor reproductive or obstetric history, unspecified trimester: Secondary | ICD-10-CM | POA: Insufficient documentation

## 2021-04-18 DIAGNOSIS — O209 Hemorrhage in early pregnancy, unspecified: Secondary | ICD-10-CM

## 2021-04-18 DIAGNOSIS — O0990 Supervision of high risk pregnancy, unspecified, unspecified trimester: Secondary | ICD-10-CM | POA: Insufficient documentation

## 2021-04-18 DIAGNOSIS — O3433 Maternal care for cervical incompetence, third trimester: Secondary | ICD-10-CM | POA: Diagnosis not present

## 2021-04-18 DIAGNOSIS — O99213 Obesity complicating pregnancy, third trimester: Secondary | ICD-10-CM

## 2021-04-18 DIAGNOSIS — Z6841 Body Mass Index (BMI) 40.0 and over, adult: Secondary | ICD-10-CM

## 2021-04-18 DIAGNOSIS — E669 Obesity, unspecified: Secondary | ICD-10-CM

## 2021-04-18 DIAGNOSIS — Z3A30 30 weeks gestation of pregnancy: Secondary | ICD-10-CM

## 2021-04-18 DIAGNOSIS — O99210 Obesity complicating pregnancy, unspecified trimester: Secondary | ICD-10-CM

## 2021-04-18 DIAGNOSIS — O24410 Gestational diabetes mellitus in pregnancy, diet controlled: Secondary | ICD-10-CM | POA: Diagnosis not present

## 2021-04-18 DIAGNOSIS — O2623 Pregnancy care for patient with recurrent pregnancy loss, third trimester: Secondary | ICD-10-CM

## 2021-04-18 DIAGNOSIS — O09213 Supervision of pregnancy with history of pre-term labor, third trimester: Secondary | ICD-10-CM

## 2021-04-18 DIAGNOSIS — Z362 Encounter for other antenatal screening follow-up: Secondary | ICD-10-CM

## 2021-04-19 ENCOUNTER — Ambulatory Visit (INDEPENDENT_AMBULATORY_CARE_PROVIDER_SITE_OTHER): Payer: Medicaid Other | Admitting: Family Medicine

## 2021-04-19 VITALS — BP 129/84 | HR 99 | Wt 369.1 lb

## 2021-04-19 DIAGNOSIS — O0990 Supervision of high risk pregnancy, unspecified, unspecified trimester: Secondary | ICD-10-CM

## 2021-04-19 DIAGNOSIS — O24410 Gestational diabetes mellitus in pregnancy, diet controlled: Secondary | ICD-10-CM

## 2021-04-19 DIAGNOSIS — Z3A30 30 weeks gestation of pregnancy: Secondary | ICD-10-CM

## 2021-04-19 DIAGNOSIS — O09899 Supervision of other high risk pregnancies, unspecified trimester: Secondary | ICD-10-CM

## 2021-04-19 DIAGNOSIS — N883 Incompetence of cervix uteri: Secondary | ICD-10-CM

## 2021-04-19 DIAGNOSIS — O3430 Maternal care for cervical incompetence, unspecified trimester: Secondary | ICD-10-CM

## 2021-04-19 NOTE — Progress Notes (Signed)
   PRENATAL VISIT NOTE  Subjective:  Cynthia Valenzuela is a 25 y.o. G7P0150 at [redacted]w[redacted]d being seen today for ongoing prenatal care.  She is currently monitored for the following issues for this high-risk pregnancy and has Frequent headaches; Incompetent cervix; Supervision of high risk pregnancy, antepartum; History of incompetent cervix, currently pregnant; Obesity in pregnancy; Bleeding in early pregnancy; History of preterm delivery, currently pregnant; BMI 60.0-69.9, adult (HCC); Gestational diabetes mellitus (GDM), antepartum; Cervical cerclage suture present, antepartum; and Elevated BP without diagnosis of hypertension on their problem list.  Patient reports no complaints.  Contractions: Not present. Vag. Bleeding: None.  Movement: (!) Decreased. Denies leaking of fluid.   The following portions of the patient's history were reviewed and updated as appropriate: allergies, current medications, past family history, past medical history, past social history, past surgical history and problem list.   Objective:   Vitals:   04/19/21 1332  BP: 129/84  Pulse: 99  Weight: (!) 369 lb 1.6 oz (167.4 kg)    Fetal Status: Fetal Heart Rate (bpm): 161   Movement: (!) Decreased     General:  Alert, oriented and cooperative. Patient is in no acute distress.  Skin: Skin is warm and dry. No rash noted.   Cardiovascular: Normal heart rate noted  Respiratory: Normal respiratory effort, no problems with respiration noted  Abdomen: Soft, gravid, appropriate for gestational age.  Pain/Pressure: Absent     Pelvic: Cervical exam deferred        Extremities: Normal range of motion.  Edema: Trace  Mental Status: Normal mood and affect. Normal behavior. Normal judgment and thought content.   Assessment and Plan:  Pregnancy: G7P0150 at [redacted]w[redacted]d 1. [redacted] weeks gestation of pregnancy  2. Supervision of high risk pregnancy, antepartum FHT and FH normal  3. Cervical cerclage suture present, antepartum Removal  36-37 weeks  4. Diet controlled gestational diabetes mellitus (GDM), antepartum Fastings - half elevated over past week. Not eating bedtime snack. PP - had several elevated after lunch about 2-3 weeks ago, better now. EFW 13% - AC 29%. Has been tracking on this growth curve.  5. Incompetent cervix Cerclage placed  6. History of preterm delivery, currently pregnant Due to cervical incompetence  Preterm labor symptoms and general obstetric precautions including but not limited to vaginal bleeding, contractions, leaking of fluid and fetal movement were reviewed in detail with the patient. Please refer to After Visit Summary for other counseling recommendations.   No follow-ups on file.  Future Appointments  Date Time Provider Department Center  05/18/2021  3:30 PM [facility] NURSE [facility]  05/18/2021  3:45 PM [facility]4 [facility]    Levie Heritage, DO

## 2021-05-03 ENCOUNTER — Encounter: Payer: Self-pay | Admitting: Obstetrics and Gynecology

## 2021-05-03 ENCOUNTER — Other Ambulatory Visit: Payer: Self-pay

## 2021-05-03 ENCOUNTER — Ambulatory Visit (INDEPENDENT_AMBULATORY_CARE_PROVIDER_SITE_OTHER): Payer: Medicaid Other | Admitting: Obstetrics and Gynecology

## 2021-05-03 VITALS — BP 130/75 | HR 92 | Wt 375.6 lb

## 2021-05-03 DIAGNOSIS — O3430 Maternal care for cervical incompetence, unspecified trimester: Secondary | ICD-10-CM

## 2021-05-03 DIAGNOSIS — R030 Elevated blood-pressure reading, without diagnosis of hypertension: Secondary | ICD-10-CM

## 2021-05-03 DIAGNOSIS — Z3A32 32 weeks gestation of pregnancy: Secondary | ICD-10-CM

## 2021-05-03 DIAGNOSIS — N883 Incompetence of cervix uteri: Secondary | ICD-10-CM

## 2021-05-03 DIAGNOSIS — O24410 Gestational diabetes mellitus in pregnancy, diet controlled: Secondary | ICD-10-CM

## 2021-05-03 DIAGNOSIS — O09899 Supervision of other high risk pregnancies, unspecified trimester: Secondary | ICD-10-CM

## 2021-05-03 DIAGNOSIS — Z6841 Body Mass Index (BMI) 40.0 and over, adult: Secondary | ICD-10-CM

## 2021-05-03 DIAGNOSIS — O0990 Supervision of high risk pregnancy, unspecified, unspecified trimester: Secondary | ICD-10-CM

## 2021-05-03 NOTE — Progress Notes (Signed)
   PRENATAL VISIT NOTE  Subjective:  Cynthia Valenzuela is a 25 y.o. G7P0150 at [redacted]w[redacted]d being seen today for ongoing prenatal care.  She is currently monitored for the following issues for this high-risk pregnancy and has Frequent headaches; Incompetent cervix; Supervision of high risk pregnancy, antepartum; History of incompetent cervix, currently pregnant; Obesity in pregnancy; Bleeding in early pregnancy; History of preterm delivery, currently pregnant; BMI 60.0-69.9, adult (HCC); Gestational diabetes mellitus (GDM), antepartum; Cervical cerclage suture present, antepartum; and Elevated BP without diagnosis of hypertension on their problem list.  Patient reports  pain in right wrist improved with brace, occasional cramping .   .  .  Movement: (!) Decreased. Denies leaking of fluid.   The following portions of the patient's history were reviewed and updated as appropriate: allergies, current medications, past family history, past medical history, past social history, past surgical history and problem list.   Objective:   Vitals:   05/03/21 1356  BP: 130/75  Pulse: 92  Weight: (!) 375 lb 9.6 oz (170.4 kg)    Fetal Status: Fetal Heart Rate (bpm): 154   Movement: (!) Decreased     General:  Alert, oriented and cooperative. Patient is in no acute distress.  Skin: Skin is warm and dry. No rash noted.   Cardiovascular: Normal heart rate noted  Respiratory: Normal respiratory effort, no problems with respiration noted  Abdomen: Soft, gravid, appropriate for gestational age.  Pain/Pressure: Present     Pelvic: Cervical exam deferred        Extremities: Normal range of motion.  Edema: Trace  Mental Status: Normal mood and affect. Normal behavior. Normal judgment and thought content.   Assessment and Plan:  Pregnancy: G7P0150 at [redacted]w[redacted]d  1. Diet controlled gestational diabetes mellitus (GDM), antepartum Reports CBGs are good, forgot to bring log States fasting in high 80s, low 90s, post  prandials are 80s-120s  2. Incompetent cervix  3. Cervical cerclage suture present, antepartum Cerclage to come out 36 weeks  4. Supervision of high risk pregnancy, antepartum - Reviewed decreased movement, patient has been feeling "squirming" rather that sharp movements, reassured her this is normal with decreased room within uterus, reviewed kick counts and reasons to present to hospital - after reviewing, she is feeling regular movements that are just less strong that previous  5. BMI 60.0-69.9, adult (HCC)  6. History of preterm delivery, currently pregnant  7. Elevated BP without diagnosis of hypertension  8. [redacted] weeks gestation of pregnancy   Preterm labor symptoms and general obstetric precautions including but not limited to vaginal bleeding, contractions, leaking of fluid and fetal movement were reviewed in detail with the patient. Please refer to After Visit Summary for other counseling recommendations.   Return in about 2 weeks (around 05/17/2021) for high OB, in person.  Future Appointments  Date Time Provider Department Center  05/18/2021  3:30 PM [facility] NURSE [facility]  05/18/2021  3:45 PM [facility]4 [facility]    Conan Bowens, MD'

## 2021-05-17 ENCOUNTER — Ambulatory Visit (INDEPENDENT_AMBULATORY_CARE_PROVIDER_SITE_OTHER): Payer: Medicaid Other | Admitting: Nurse Practitioner

## 2021-05-17 ENCOUNTER — Other Ambulatory Visit: Payer: Self-pay

## 2021-05-17 VITALS — BP 117/78 | HR 92 | Wt 380.6 lb

## 2021-05-17 DIAGNOSIS — O24410 Gestational diabetes mellitus in pregnancy, diet controlled: Secondary | ICD-10-CM

## 2021-05-17 DIAGNOSIS — Z3A34 34 weeks gestation of pregnancy: Secondary | ICD-10-CM

## 2021-05-17 DIAGNOSIS — O3430 Maternal care for cervical incompetence, unspecified trimester: Secondary | ICD-10-CM

## 2021-05-17 DIAGNOSIS — O0990 Supervision of high risk pregnancy, unspecified, unspecified trimester: Secondary | ICD-10-CM

## 2021-05-17 NOTE — Progress Notes (Signed)
    Subjective:  Cynthia Valenzuela is a 25 y.o. G7P0150 at [redacted]w[redacted]d being seen today for ongoing prenatal care.  She is currently monitored for the following issues for this high-risk pregnancy and has Frequent headaches; Incompetent cervix; Supervision of high risk pregnancy, antepartum; History of incompetent cervix, currently pregnant; Obesity in pregnancy; Bleeding in early pregnancy; History of preterm delivery, currently pregnant; BMI 60.0-69.9, adult (HCC); Gestational diabetes mellitus (GDM), antepartum; Cervical cerclage suture present, antepartum; and Elevated BP without diagnosis of hypertension on their problem list.  Patient reports no complaints.  Contractions: Irritability. Vag. Bleeding: None.  Movement: Present. Denies leaking of fluid.   The following portions of the patient's history were reviewed and updated as appropriate: allergies, current medications, past family history, past medical history, past social history, past surgical history and problem list. Problem list updated.  Objective:   Vitals:   05/17/21 1437  BP: 117/78  Pulse: 92  Weight: (!) 380 lb 9.6 oz (172.6 kg)    Fetal Status: Fetal Heart Rate (bpm): 164   Movement: Present     General:  Alert, oriented and cooperative. Patient is in no acute distress.  Skin: Skin is warm and dry. No rash noted.   Cardiovascular: Normal heart rate noted  Respiratory: Normal respiratory effort, no problems with respiration noted  Abdomen: Soft, gravid, appropriate for gestational age. Pain/Pressure: Absent     Pelvic:  Cervical exam deferred        Extremities: Normal range of motion.  Edema: Trace  Mental Status: Normal mood and affect. Normal behavior. Normal judgment and thought content.   Urinalysis:      Assessment and Plan:  Pregnancy: G7P0150 at [redacted]w[redacted]d  1. Supervision of high risk pregnancy, antepartum Has ultrasound upcoming tomorrow for growth due to severe obesity and gestational diabetes Will need vaginal  swabs done at next visit Reports good fetal movement  2. Cervical cerclage suture present, antepartum No pain or vaginal bleeding Asked about cerclage removal - advised to discuss with MD at next visit  3. Diet controlled gestational diabetes mellitus (GDM), antepartum Did not bring her written glucose levels to today's visit Recalls usual Fasting as 87-88 with 92 as her highest Recalls 2 hr after meals as approx 122-127 Advised to bring written record at next visit - from recall, seem to be in range  4. [redacted] weeks gestation of pregnancy   Preterm labor symptoms and general obstetric precautions including but not limited to vaginal bleeding, contractions, leaking of fluid and fetal movement were reviewed in detail with the patient. Please refer to After Visit Summary for other counseling recommendations.  Return in about 8 days (around 05/25/2021), or or later than 05-25-21, for in person [facility] with MD for vaginal swabs and cerclage removal.  Nolene Bernheim, RN, MSN, NP-BC Nurse Practitioner, [facility], [facility] 05/17/2021 3:11 PM

## 2021-05-18 ENCOUNTER — Other Ambulatory Visit: Payer: Self-pay | Admitting: Maternal & Fetal Medicine

## 2021-05-18 ENCOUNTER — Encounter: Payer: Self-pay | Admitting: *Deleted

## 2021-05-18 ENCOUNTER — Ambulatory Visit: Payer: Medicaid Other | Admitting: *Deleted

## 2021-05-18 ENCOUNTER — Ambulatory Visit: Payer: Medicaid Other | Attending: Maternal & Fetal Medicine

## 2021-05-18 VITALS — BP 147/71 | HR 77

## 2021-05-18 DIAGNOSIS — O09299 Supervision of pregnancy with other poor reproductive or obstetric history, unspecified trimester: Secondary | ICD-10-CM | POA: Diagnosis present

## 2021-05-18 DIAGNOSIS — O3433 Maternal care for cervical incompetence, third trimester: Secondary | ICD-10-CM | POA: Insufficient documentation

## 2021-05-18 DIAGNOSIS — O2623 Pregnancy care for patient with recurrent pregnancy loss, third trimester: Secondary | ICD-10-CM | POA: Insufficient documentation

## 2021-05-18 DIAGNOSIS — O209 Hemorrhage in early pregnancy, unspecified: Secondary | ICD-10-CM | POA: Insufficient documentation

## 2021-05-18 DIAGNOSIS — O99210 Obesity complicating pregnancy, unspecified trimester: Secondary | ICD-10-CM

## 2021-05-18 DIAGNOSIS — Z362 Encounter for other antenatal screening follow-up: Secondary | ICD-10-CM | POA: Diagnosis not present

## 2021-05-18 DIAGNOSIS — O0990 Supervision of high risk pregnancy, unspecified, unspecified trimester: Secondary | ICD-10-CM | POA: Diagnosis present

## 2021-05-18 DIAGNOSIS — O09893 Supervision of other high risk pregnancies, third trimester: Secondary | ICD-10-CM | POA: Diagnosis not present

## 2021-05-18 DIAGNOSIS — Z6841 Body Mass Index (BMI) 40.0 and over, adult: Secondary | ICD-10-CM

## 2021-05-18 DIAGNOSIS — O09213 Supervision of pregnancy with history of pre-term labor, third trimester: Secondary | ICD-10-CM | POA: Diagnosis not present

## 2021-05-18 DIAGNOSIS — O09293 Supervision of pregnancy with other poor reproductive or obstetric history, third trimester: Secondary | ICD-10-CM | POA: Diagnosis not present

## 2021-05-18 DIAGNOSIS — O99213 Obesity complicating pregnancy, third trimester: Secondary | ICD-10-CM | POA: Insufficient documentation

## 2021-05-18 DIAGNOSIS — Z3A35 35 weeks gestation of pregnancy: Secondary | ICD-10-CM | POA: Insufficient documentation

## 2021-05-18 DIAGNOSIS — O09899 Supervision of other high risk pregnancies, unspecified trimester: Secondary | ICD-10-CM | POA: Diagnosis present

## 2021-05-18 DIAGNOSIS — O24410 Gestational diabetes mellitus in pregnancy, diet controlled: Secondary | ICD-10-CM | POA: Insufficient documentation

## 2021-05-18 DIAGNOSIS — O0993 Supervision of high risk pregnancy, unspecified, third trimester: Secondary | ICD-10-CM | POA: Insufficient documentation

## 2021-05-19 ENCOUNTER — Other Ambulatory Visit: Payer: Self-pay | Admitting: *Deleted

## 2021-05-19 DIAGNOSIS — Z6841 Body Mass Index (BMI) 40.0 and over, adult: Secondary | ICD-10-CM

## 2021-05-24 ENCOUNTER — Other Ambulatory Visit (HOSPITAL_COMMUNITY)
Admission: RE | Admit: 2021-05-24 | Discharge: 2021-05-24 | Disposition: A | Discharge status: Home / Self Care | Payer: Medicaid Other | Source: Ambulatory Visit | Attending: Obstetrics and Gynecology | Admitting: Obstetrics and Gynecology

## 2021-05-24 ENCOUNTER — Ambulatory Visit (INDEPENDENT_AMBULATORY_CARE_PROVIDER_SITE_OTHER): Payer: Medicaid Other | Admitting: Obstetrics and Gynecology

## 2021-05-24 ENCOUNTER — Other Ambulatory Visit: Payer: Self-pay

## 2021-05-24 ENCOUNTER — Encounter: Payer: Self-pay | Admitting: Obstetrics and Gynecology

## 2021-05-24 VITALS — BP 152/77 | HR 89 | Wt 379.1 lb

## 2021-05-24 DIAGNOSIS — O0990 Supervision of high risk pregnancy, unspecified, unspecified trimester: Secondary | ICD-10-CM | POA: Insufficient documentation

## 2021-05-24 DIAGNOSIS — O09899 Supervision of other high risk pregnancies, unspecified trimester: Secondary | ICD-10-CM

## 2021-05-24 DIAGNOSIS — Z3A35 35 weeks gestation of pregnancy: Secondary | ICD-10-CM

## 2021-05-24 DIAGNOSIS — O24410 Gestational diabetes mellitus in pregnancy, diet controlled: Secondary | ICD-10-CM

## 2021-05-24 DIAGNOSIS — O09299 Supervision of pregnancy with other poor reproductive or obstetric history, unspecified trimester: Secondary | ICD-10-CM

## 2021-05-24 DIAGNOSIS — O3430 Maternal care for cervical incompetence, unspecified trimester: Secondary | ICD-10-CM

## 2021-05-24 DIAGNOSIS — N883 Incompetence of cervix uteri: Secondary | ICD-10-CM

## 2021-05-24 NOTE — Progress Notes (Signed)
Subjective:  Cynthia Valenzuela is a 25 y.o. G7P0150 at [redacted]w[redacted]d being seen today for ongoing prenatal care.  She is currently monitored for the following issues for this high-risk pregnancy and has Frequent headaches; Incompetent cervix; Supervision of high risk pregnancy, antepartum; History of incompetent cervix, currently pregnant; Obesity in pregnancy; History of preterm delivery, currently pregnant; BMI 60.0-69.9, adult (HCC); Gestational diabetes mellitus (GDM), antepartum; Cervical cerclage suture present, antepartum; and Elevated BP without diagnosis of hypertension on their problem list.  Patient reports  general discomforts of pregnancy.  Denies HA or visual changes.  Contractions: Irritability. Vag. Bleeding: None.  Movement: Present. Denies leaking of fluid.   The following portions of the patient's history were reviewed and updated as appropriate: allergies, current medications, past family history, past medical history, past social history, past surgical history and problem list. Problem list updated.  Objective:   Vitals:   05/24/21 0917 05/24/21 0943  BP: (!) 171/119 (!) 181/120  Pulse: 89   Weight: (!) 379 lb 1.6 oz (172 kg)     Fetal Status: Fetal Heart Rate (bpm): 145   Movement: Present     General:  Alert, oriented and cooperative. Patient is in no acute distress.  Skin: Skin is warm and dry. No rash noted.   Cardiovascular: Normal heart rate noted  Respiratory: Normal respiratory effort, no problems with respiration noted  Abdomen: Soft, gravid, appropriate for gestational age. Pain/Pressure: Absent     Pelvic:  Cervical exam performed        Extremities: Normal range of motion.  Edema: Trace  Mental Status: Normal mood and affect. Normal behavior. Normal judgment and thought content.   Urinalysis:        Procedure: Informed consent obtained. Pt placed in DL position. Speculum placed. Cerclage tag visualized and grasped with [place] clamp. Cut and removed. Speculum  removed. Pt tolerated well.   Assessment and Plan:  Pregnancy: G7P0150 at [redacted]w[redacted]d  1. Supervision of high risk pregnancy, antepartum Stable Vaginal cultures today  2. History of incompetent cervix, currently pregnant Cerclage removed today  3. History of preterm delivery, currently pregnant Cerclage removed today  4. Diet controlled gestational diabetes mellitus (GDM), antepartum CBG's stable U/S 05/26/21  5. Cervical cerclage suture present, antepartum Removed today  6. Incompetent cervix Cerclage removed today  Preterm labor symptoms and general obstetric precautions including but not limited to vaginal bleeding, contractions, leaking of fluid and fetal movement were reviewed in detail with the patient. Please refer to After Visit Summary for other counseling recommendations.  Return in about 1 week (around 05/31/2021) for OB visit, face to face, MD only.   Hermina Staggers, MD

## 2021-05-24 NOTE — Patient Instructions (Signed)
Vaginal Delivery ?Vaginal delivery means that you give birth by pushing your baby out of your birth canal (vagina). Your health care team will help you before, during, and after vaginal delivery. ?Birth experiences are unique for every woman and every pregnancy, and birth experiences vary depending on where you choose to give birth. ?What are the risks and benefits? ?Generally, this is safe. However, problems may occur, including: ?Bleeding. ?Infection. ?Damage to other structures such as vaginal tearing. ?Allergic reactions to medicines. ?Despite the risks, benefits of vaginal delivery include less risk of bleeding and infection and a shorter recovery time compared to a Cesarean delivery. Cesarean delivery, or C-section, is the surgical delivery of a baby. ?What happens when I arrive at the birth center or hospital? ?Once you are in labor and have been admitted into the hospital or birth center, your health care team may: ?Review your pregnancy history and any concerns that you have. ?Talk with you about your birth plan and discuss pain control options. ?Check your blood pressure, breathing, and heartbeat. ?Assess your baby's heartbeat. ?Monitor your uterus for contractions. ?Check whether your bag of water (amniotic sac) has broken (ruptured). ?Insert an IV into one of your veins. This may be used to give you fluids and medicines. ?Monitoring ?Your health care team may assess your contractions (uterine monitoring) and your baby's heart rate (fetal monitoring). You may need to be monitored: ?Often, but not continuously (intermittently). ?All the time or for long periods at a time (continuously). Continuous monitoring may be needed if: ?You are taking certain medicines, such as medicine to relieve pain or make your contractions stronger. ?You have pregnancy or labor complications. ?Monitoring may be done by: ?Placing a special stethoscope or a handheld monitoring device on your abdomen to check your baby's heartbeat  and to check for contractions. ?Placing monitors on your abdomen (external monitors) to record your baby's heartbeat and the frequency and length of contractions. ?Placing monitors inside your uterus through your vagina (internal monitors) to record your baby's heartbeat and the frequency, length, and strength of your contractions. Depending on the type of monitor, it may remain in your uterus or on your baby's head until birth. ?Telemetry. This is a type of continuous monitoring that can be done with external or internal monitors. Instead of having to stay in bed, you are able to move around. ?Physical exam ?Your health care team may perform frequent physical exams. This may include: ?Checking how and where your baby is positioned in your uterus. ?Checking your cervix to determine: ?Whether it is thinning out (effacing). ?Whether it is opening up (dilating). ?What happens during labor and delivery? ?Normal labor and delivery is divided into the following three stages: ?Stage 1 ?This is the longest stage of labor. ?Throughout this stage, you will feel contractions. Contractions generally feel mild, infrequent, and irregular at first. They get stronger, more frequent, and more regular as you move through this stage. You may have contractions about every 2-3 minutes. ?This stage ends when your cervix is completely dilated to 4 inches (10 cm) and completely effaced. ?Stage 2 ?This stage starts once your cervix is completely effaced and dilated and lasts until the delivery of your baby. ?This is the stage where you will feel an urge to push your baby out of your vagina. ?You may feel stretching and burning pain, especially when the widest part of your baby's head passes through the vaginal opening (crowning). ?Once your baby is delivered, the umbilical cord will be clamped and   cut. Timing of cutting the cord will depend on your wishes, your baby's health, and your health care provider's practices. ?Your baby will be  placed on your bare chest (skin-to-skin contact) in an upright position and covered with a warm blanket. If you are choosing to breastfeed, watch your baby for feeding cues, like rooting or sucking, and help the baby to your breast for his or her first feeding. ?Stage 3 ?This stage starts immediately after the birth of your baby and ends after you deliver the placenta. ?This stage may take anywhere from 5 to 30 minutes. ?After your baby has been delivered, you will feel contractions as your body expels the placenta. These contractions also help your uterus get smaller and reduce bleeding. ?What can I expect after labor and delivery? ?After labor is over, you and your baby will be assessed closely until you are ready to go home. Your health care team will teach you how to care for yourself and your baby. ?You and your baby may be encouraged to stay in the same room (rooming in) during your hospital stay. This will help promote early bonding and successful breastfeeding. ?Your uterus will be checked and massaged regularly (fundal massage). ?You may continue to receive fluids and medicines through an IV. ?You will have some soreness and pain in your abdomen, vagina, and the area of skin between your vaginal opening and your anus (perineum). ?If an incision was made near your vagina (episiotomy) or if you had some vaginal tearing during delivery, cold compresses may be placed on your episiotomy or your tear. This helps to reduce pain and swelling. ?It is normal to have vaginal bleeding after delivery. Wear a sanitary pad for vaginal bleeding and discharge. ?Summary ?Vaginal delivery means that you will give birth by pushing your baby out of your birth canal (vagina). ?Your health care team will monitor you and your baby throughout the stages of labor. ?After you deliver your baby, your health care team will continue to assess you and your baby to ensure you are both recovering as expected after delivery. ?This  information is not intended to replace advice given to you by your health care provider. Make sure you discuss any questions you have with your health care provider. ?Document Revised: 08/22/2020 Document Reviewed: 08/22/2020 ?Elsevier Patient Education ? 2022 Elsevier Inc. ? ?

## 2021-05-24 NOTE — Addendum Note (Signed)
Addended by: Denyce Robert E on: 05/24/2021 11:13 AM   Modules accepted: Orders

## 2021-05-24 NOTE — Progress Notes (Signed)
FHR obtained via informal bedside ultrasound, 145bpm

## 2021-05-25 LAB — GC/CHLAMYDIA PROBE AMP (~~LOC~~) NOT AT ARMC
Chlamydia: NEGATIVE
Comment: NEGATIVE
Comment: NORMAL
Neisseria Gonorrhea: NEGATIVE

## 2021-05-26 ENCOUNTER — Other Ambulatory Visit: Payer: Self-pay

## 2021-05-26 ENCOUNTER — Ambulatory Visit: Payer: Medicaid Other | Admitting: *Deleted

## 2021-05-26 ENCOUNTER — Encounter: Payer: Self-pay | Admitting: *Deleted

## 2021-05-26 ENCOUNTER — Ambulatory Visit: Payer: Medicaid Other | Attending: Obstetrics and Gynecology

## 2021-05-26 VITALS — BP 125/75 | HR 73

## 2021-05-26 DIAGNOSIS — Z6841 Body Mass Index (BMI) 40.0 and over, adult: Secondary | ICD-10-CM

## 2021-05-26 DIAGNOSIS — O99213 Obesity complicating pregnancy, third trimester: Secondary | ICD-10-CM | POA: Insufficient documentation

## 2021-05-26 DIAGNOSIS — O09293 Supervision of pregnancy with other poor reproductive or obstetric history, third trimester: Secondary | ICD-10-CM | POA: Diagnosis not present

## 2021-05-26 DIAGNOSIS — O0990 Supervision of high risk pregnancy, unspecified, unspecified trimester: Secondary | ICD-10-CM

## 2021-05-26 DIAGNOSIS — O09899 Supervision of other high risk pregnancies, unspecified trimester: Secondary | ICD-10-CM

## 2021-05-26 DIAGNOSIS — O09299 Supervision of pregnancy with other poor reproductive or obstetric history, unspecified trimester: Secondary | ICD-10-CM

## 2021-05-26 DIAGNOSIS — O99210 Obesity complicating pregnancy, unspecified trimester: Secondary | ICD-10-CM

## 2021-05-26 DIAGNOSIS — Z3A36 36 weeks gestation of pregnancy: Secondary | ICD-10-CM | POA: Insufficient documentation

## 2021-05-26 DIAGNOSIS — O24410 Gestational diabetes mellitus in pregnancy, diet controlled: Secondary | ICD-10-CM | POA: Diagnosis not present

## 2021-05-26 DIAGNOSIS — O09213 Supervision of pregnancy with history of pre-term labor, third trimester: Secondary | ICD-10-CM

## 2021-05-26 DIAGNOSIS — O3433 Maternal care for cervical incompetence, third trimester: Secondary | ICD-10-CM | POA: Diagnosis not present

## 2021-05-26 DIAGNOSIS — O2623 Pregnancy care for patient with recurrent pregnancy loss, third trimester: Secondary | ICD-10-CM

## 2021-05-28 ENCOUNTER — Encounter: Payer: Self-pay | Admitting: Obstetrics and Gynecology

## 2021-05-28 DIAGNOSIS — O99820 Streptococcus B carrier state complicating pregnancy: Secondary | ICD-10-CM | POA: Insufficient documentation

## 2021-05-28 LAB — CULTURE, BETA STREP (GROUP B ONLY): Strep Gp B Culture: POSITIVE — AB

## 2021-05-31 ENCOUNTER — Ambulatory Visit (INDEPENDENT_AMBULATORY_CARE_PROVIDER_SITE_OTHER): Payer: Medicaid Other | Admitting: Obstetrics and Gynecology

## 2021-05-31 ENCOUNTER — Other Ambulatory Visit: Payer: Self-pay

## 2021-05-31 VITALS — BP 140/85 | HR 92 | Wt 376.0 lb

## 2021-05-31 DIAGNOSIS — Z6841 Body Mass Index (BMI) 40.0 and over, adult: Secondary | ICD-10-CM

## 2021-05-31 DIAGNOSIS — O09299 Supervision of pregnancy with other poor reproductive or obstetric history, unspecified trimester: Secondary | ICD-10-CM

## 2021-05-31 DIAGNOSIS — Z3A36 36 weeks gestation of pregnancy: Secondary | ICD-10-CM

## 2021-05-31 DIAGNOSIS — O99820 Streptococcus B carrier state complicating pregnancy: Secondary | ICD-10-CM

## 2021-05-31 DIAGNOSIS — O133 Gestational [pregnancy-induced] hypertension without significant proteinuria, third trimester: Secondary | ICD-10-CM | POA: Insufficient documentation

## 2021-05-31 DIAGNOSIS — O24410 Gestational diabetes mellitus in pregnancy, diet controlled: Secondary | ICD-10-CM

## 2021-05-31 DIAGNOSIS — O99210 Obesity complicating pregnancy, unspecified trimester: Secondary | ICD-10-CM

## 2021-05-31 LAB — COMPREHENSIVE METABOLIC PANEL
ALT: 13 IU/L (ref 0–32)
AST: 18 IU/L (ref 0–40)
Albumin/Globulin Ratio: 1.3 (ref 1.2–2.2)
Albumin: 3.4 g/dL — ABNORMAL LOW (ref 3.9–5.0)
Alkaline Phosphatase: 283 IU/L — ABNORMAL HIGH (ref 44–121)
BUN/Creatinine Ratio: 20 (ref 9–23)
BUN: 12 mg/dL (ref 6–20)
Bilirubin Total: <0.2 mg/dL (ref 0.0–1.2)
CO2: 21 mmol/L (ref 20–29)
Calcium: 9.8 mg/dL (ref 8.7–10.2)
Chloride: 103 mmol/L (ref 96–106)
Creatinine, Ser: 0.61 mg/dL (ref 0.57–1.00)
Globulin, Total: 2.6 g/dL (ref 1.5–4.5)
Glucose: 83 mg/dL (ref 65–99)
Potassium: 4.3 mmol/L (ref 3.5–5.2)
Sodium: 137 mmol/L (ref 134–144)
Total Protein: 6 g/dL (ref 6.0–8.5)
eGFR: 126 mL/min/{1.73_m2} (ref >59)

## 2021-05-31 LAB — CBC
Hematocrit: 40.3 % (ref 34.0–46.6)
Hemoglobin: 13.1 g/dL (ref 11.1–15.9)
MCH: 28.9 pg (ref 26.6–33.0)
MCHC: 32.5 g/dL (ref 31.5–35.7)
MCV: 89 fL (ref 79–97)
Platelets: 376 10*3/uL (ref 150–450)
RBC: 4.54 x10E6/uL (ref 3.77–5.28)
RDW: 13.7 % (ref 11.7–15.4)
WBC: 10 10*3/uL (ref 3.4–10.8)

## 2021-05-31 NOTE — Progress Notes (Addendum)
   PRENATAL VISIT NOTE  Subjective:  Cynthia Valenzuela is a 25 y.o. G7P0150 at [redacted]w[redacted]d being seen today for ongoing prenatal care.  She is currently monitored for the following issues for this high-risk pregnancy and has Frequent headaches; Incompetent cervix; Supervision of high risk pregnancy, antepartum; History of incompetent cervix, currently pregnant; Obesity in pregnancy; History of preterm delivery, currently pregnant; BMI 60.0-69.9, adult (HCC); Gestational diabetes mellitus (GDM), antepartum; Cervical cerclage suture present, antepartum; Elevated BP without diagnosis of hypertension; GBS (group B Streptococcus carrier), +RV culture, currently pregnant; and Gestational hypertension, third trimester on their problem list.  Patient reports no complaints.  Contractions: Irritability. Vag. Bleeding: None.  Movement: Present. Denies leaking of fluid.   The following portions of the patient's history were reviewed and updated as appropriate: allergies, current medications, past family history, past medical history, past social history, past surgical history and problem list.   Objective:   Vitals:   05/31/21 1552  BP: 140/85  Pulse: 92  Weight: (!) 376 lb (170.6 kg)    Fetal Status: Fetal Heart Rate (bpm): 161   Movement: Present  Presentation: Vertex  General:  Alert, oriented and cooperative. Patient is in no acute distress.  Skin: Skin is warm and dry. No rash noted.   Cardiovascular: Normal heart rate noted  Respiratory: Normal respiratory effort, no problems with respiration noted  Abdomen: Soft, gravid, appropriate for gestational age.  Pain/Pressure: Absent     Pelvic: Cervical exam deferred        Extremities: Normal range of motion.  Edema: None  Mental Status: Normal mood and affect. Normal behavior. Normal judgment and thought content.   Assessment and Plan:  Pregnancy: G7P0150 at [redacted]w[redacted]d 1. Gestational hypertension, third trimester Diagnosed today based on persistently  high BPs at her prenatal visits; pt denies any h/o HTN outside of pregnancy and pt denies any s/s of pre-eclampsia. Pt set up for day time 8/25 IOL. Screening labs today. OB triage precautions given.  - CBC - Comprehensive metabolic panel  2. Diet controlled gestational diabetes mellitus (GDM), antepartum No log today but states they are normal  3. GBS (group B Streptococcus carrier), +RV culture, currently pregnant Tx in labor  4. BMI 60.0-69.9, adult (HCC) Bedside u/s with FHR at 161, subjectively normal AF, +FM and hiccups, cephalic  5. Obesity in pregnancy  6. History of incompetent cervix, currently pregnant Cerclage removed last visit  7. Supervision of high risk pregnancy 8/19: bpp 8/8, cephalic 8/11: 11%, 2182gm, ac 31%  Preterm labor symptoms and general obstetric precautions including but not limited to vaginal bleeding, contractions, leaking of fluid and fetal movement were reviewed in detail with the patient. Please refer to After Visit Summary for other counseling recommendations.   Return in about 2 weeks (around 06/14/2021) for in person, rn visit, bp check.  Future Appointments  Date Time Provider Department Center  06/01/2021  7:50 AM [facility] SCHED ROOM [facility] None  06/02/2021  1:30 PM [facility] NURSE [facility]  06/02/2021  1:45 PM [facility] US4 [facility]  06/09/2021  1:30 PM [facility] NURSE [facility]  06/09/2021  1:45 PM [facility] US4 [facility]  06/15/2021 10:20 AM [facility] NURSE [facility]  06/30/2021  9:35 AM Milas Hock, MD [facility]    Dailey Bing, MD

## 2021-06-01 ENCOUNTER — Other Ambulatory Visit: Payer: Self-pay

## 2021-06-01 ENCOUNTER — Encounter (HOSPITAL_COMMUNITY): Payer: Self-pay | Admitting: Obstetrics and Gynecology

## 2021-06-01 ENCOUNTER — Inpatient Hospital Stay (HOSPITAL_COMMUNITY): Payer: Medicaid Other

## 2021-06-01 ENCOUNTER — Inpatient Hospital Stay (HOSPITAL_COMMUNITY)
Admission: RE | Admit: 2021-06-01 | Discharge: 2021-06-04 | DRG: 807 | Disposition: A | Discharge status: Home / Self Care | Payer: Medicaid Other | Attending: Obstetrics and Gynecology | Admitting: Obstetrics and Gynecology

## 2021-06-01 DIAGNOSIS — O24420 Gestational diabetes mellitus in childbirth, diet controlled: Secondary | ICD-10-CM | POA: Diagnosis present

## 2021-06-01 DIAGNOSIS — Z3A37 37 weeks gestation of pregnancy: Secondary | ICD-10-CM | POA: Diagnosis not present

## 2021-06-01 DIAGNOSIS — O24419 Gestational diabetes mellitus in pregnancy, unspecified control: Secondary | ICD-10-CM | POA: Diagnosis present

## 2021-06-01 DIAGNOSIS — O99820 Streptococcus B carrier state complicating pregnancy: Secondary | ICD-10-CM | POA: Diagnosis not present

## 2021-06-01 DIAGNOSIS — Z7982 Long term (current) use of aspirin: Secondary | ICD-10-CM

## 2021-06-01 DIAGNOSIS — O1414 Severe pre-eclampsia complicating childbirth: Secondary | ICD-10-CM | POA: Diagnosis present

## 2021-06-01 DIAGNOSIS — Z23 Encounter for immunization: Secondary | ICD-10-CM

## 2021-06-01 DIAGNOSIS — Z20822 Contact with and (suspected) exposure to covid-19: Secondary | ICD-10-CM | POA: Diagnosis present

## 2021-06-01 DIAGNOSIS — O09299 Supervision of pregnancy with other poor reproductive or obstetric history, unspecified trimester: Secondary | ICD-10-CM

## 2021-06-01 DIAGNOSIS — N883 Incompetence of cervix uteri: Secondary | ICD-10-CM | POA: Diagnosis present

## 2021-06-01 DIAGNOSIS — Z87891 Personal history of nicotine dependence: Secondary | ICD-10-CM

## 2021-06-01 DIAGNOSIS — Z8616 Personal history of COVID-19: Secondary | ICD-10-CM

## 2021-06-01 DIAGNOSIS — O99824 Streptococcus B carrier state complicating childbirth: Secondary | ICD-10-CM | POA: Diagnosis present

## 2021-06-01 DIAGNOSIS — Z8632 Personal history of gestational diabetes: Secondary | ICD-10-CM | POA: Diagnosis present

## 2021-06-01 DIAGNOSIS — Z8759 Personal history of other complications of pregnancy, childbirth and the puerperium: Secondary | ICD-10-CM

## 2021-06-01 DIAGNOSIS — O139 Gestational [pregnancy-induced] hypertension without significant proteinuria, unspecified trimester: Secondary | ICD-10-CM | POA: Diagnosis present

## 2021-06-01 DIAGNOSIS — O99210 Obesity complicating pregnancy, unspecified trimester: Secondary | ICD-10-CM | POA: Diagnosis present

## 2021-06-01 DIAGNOSIS — O0990 Supervision of high risk pregnancy, unspecified, unspecified trimester: Secondary | ICD-10-CM

## 2021-06-01 DIAGNOSIS — O134 Gestational [pregnancy-induced] hypertension without significant proteinuria, complicating childbirth: Secondary | ICD-10-CM | POA: Diagnosis present

## 2021-06-01 DIAGNOSIS — O99214 Obesity complicating childbirth: Secondary | ICD-10-CM | POA: Diagnosis present

## 2021-06-01 LAB — RESP PANEL BY RT-PCR (FLU A&B, COVID) ARPGX2
Influenza A by PCR: NEGATIVE
Influenza B by PCR: NEGATIVE
SARS Coronavirus 2 by RT PCR: NEGATIVE

## 2021-06-01 LAB — COMPREHENSIVE METABOLIC PANEL
ALT: 15 U/L (ref 0–44)
AST: 22 U/L (ref 15–41)
Albumin: 2.6 g/dL — ABNORMAL LOW (ref 3.5–5.0)
Alkaline Phosphatase: 211 U/L — ABNORMAL HIGH (ref 38–126)
Anion gap: 9 (ref 5–15)
BUN: 14 mg/dL (ref 6–20)
CO2: 18 mmol/L — ABNORMAL LOW (ref 22–32)
Calcium: 9 mg/dL (ref 8.9–10.3)
Chloride: 110 mmol/L (ref 98–111)
Creatinine, Ser: 0.58 mg/dL (ref 0.44–1.00)
GFR, Estimated: >60 mL/min (ref >60)
Glucose, Bld: 79 mg/dL (ref 70–99)
Potassium: 3.9 mmol/L (ref 3.5–5.1)
Sodium: 137 mmol/L (ref 135–145)
Total Bilirubin: 0.3 mg/dL (ref 0.3–1.2)
Total Protein: 5.9 g/dL — ABNORMAL LOW (ref 6.5–8.1)

## 2021-06-01 LAB — CBC
HCT: 37.1 % (ref 36.0–46.0)
HCT: 36.2 % (ref 36.0–46.0)
Hemoglobin: 12.4 g/dL (ref 12.0–15.0)
Hemoglobin: 12.1 g/dL (ref 12.0–15.0)
MCH: 29.4 pg (ref 26.0–34.0)
MCH: 29.6 pg (ref 26.0–34.0)
MCHC: 33.4 g/dL (ref 30.0–36.0)
MCHC: 33.4 g/dL (ref 30.0–36.0)
MCV: 87.9 fL (ref 80.0–100.0)
MCV: 88.5 fL (ref 80.0–100.0)
Platelets: 354 10*3/uL (ref 150–400)
Platelets: 374 10*3/uL (ref 150–400)
RBC: 4.22 MIL/uL (ref 3.87–5.11)
RBC: 4.09 MIL/uL (ref 3.87–5.11)
RDW: 14.7 % (ref 11.5–15.5)
RDW: 14.7 % (ref 11.5–15.5)
WBC: 10.1 10*3/uL (ref 4.0–10.5)
WBC: 10 10*3/uL (ref 4.0–10.5)
nRBC: 0 % (ref 0.0–0.2)
nRBC: 0 % (ref 0.0–0.2)

## 2021-06-01 LAB — PROTEIN / CREATININE RATIO, URINE
Creatinine, Urine: 118.81 mg/dL
Protein Creatinine Ratio: 1.52 mg/mg{Cre} — ABNORMAL HIGH (ref 0.00–0.15)
Total Protein, Urine: 181 mg/dL

## 2021-06-01 LAB — GLUCOSE, CAPILLARY
Glucose-Capillary: 90 mg/dL (ref 70–99)
Glucose-Capillary: 91 mg/dL (ref 70–99)
Glucose-Capillary: 112 mg/dL — ABNORMAL HIGH (ref 70–99)

## 2021-06-01 LAB — TYPE AND SCREEN
ABO/RH(D): A POS
Antibody Screen: NEGATIVE

## 2021-06-01 MED ORDER — HYDRALAZINE HCL 20 MG/ML IJ SOLN: 10.0000 mg | INTRAMUSCULAR | Status: DC | PRN

## 2021-06-01 MED ORDER — OXYTOCIN BOLUS FROM INFUSION
333.0000 mL | Freq: Once | INTRAVENOUS | Status: AC
  Administered 2021-06-02: 333 mL via INTRAVENOUS

## 2021-06-01 MED ORDER — MAGNESIUM SULFATE BOLUS VIA INFUSION
4.0000 g | Freq: Once | INTRAVENOUS | Status: AC
  Administered 2021-06-01: 4 g via INTRAVENOUS
  Filled 2021-06-01: qty 1000

## 2021-06-01 MED ORDER — LABETALOL HCL 5 MG/ML IV SOLN: 80.0000 mg | INTRAVENOUS | Status: DC | PRN

## 2021-06-01 MED ORDER — ACETAMINOPHEN 325 MG PO TABS: 650.0000 mg | ORAL_TABLET | ORAL | Status: DC | PRN

## 2021-06-01 MED ORDER — LABETALOL HCL 5 MG/ML IV SOLN: 20.0000 mg | INTRAVENOUS | Status: DC | PRN

## 2021-06-01 MED ORDER — TERBUTALINE SULFATE 1 MG/ML IJ SOLN: 0.2500 mg | Freq: Once | INTRAMUSCULAR | Status: DC | PRN

## 2021-06-01 MED ORDER — LACTATED RINGERS IV SOLN
INTRAVENOUS | Status: DC
  Administered 2021-06-02: 68 mL via INTRAVENOUS

## 2021-06-01 MED ORDER — MAGNESIUM SULFATE 40 GM/1000ML IV SOLN
INTRAVENOUS | Status: AC
  Filled 2021-06-01: qty 1000

## 2021-06-01 MED ORDER — MISOPROSTOL 50MCG HALF TABLET: 50.0000 ug | ORAL_TABLET | ORAL | Status: DC

## 2021-06-01 MED ORDER — LACTATED RINGERS IV SOLN: 500.0000 mL | INTRAVENOUS | Status: DC | PRN

## 2021-06-01 MED ORDER — MISOPROSTOL 50MCG HALF TABLET
ORAL_TABLET | ORAL | Status: AC
  Filled 2021-06-01: qty 1

## 2021-06-01 MED ORDER — OXYTOCIN-SODIUM CHLORIDE 30-0.9 UT/500ML-% IV SOLN
1.0000 m[IU]/min | INTRAVENOUS | Status: DC
  Administered 2021-06-01: 2 m[IU]/min via INTRAVENOUS

## 2021-06-01 MED ORDER — ACETAMINOPHEN 500 MG PO TABS
1000.0000 mg | ORAL_TABLET | Freq: Once | ORAL | Status: AC
  Administered 2021-06-01: 1000 mg via ORAL
  Filled 2021-06-01: qty 2

## 2021-06-01 MED ORDER — ONDANSETRON HCL 4 MG/2ML IJ SOLN: 4.0000 mg | Freq: Four times a day (QID) | INTRAMUSCULAR | Status: DC | PRN

## 2021-06-01 MED ORDER — PENICILLIN G POT IN DEXTROSE 60000 UNIT/ML IV SOLN
3.0000 10*6.[IU] | INTRAVENOUS | Status: DC
  Administered 2021-06-01 – 2021-06-02 (×4): 3 10*6.[IU] via INTRAVENOUS
  Filled 2021-06-01 (×4): qty 50

## 2021-06-01 MED ORDER — LIDOCAINE HCL (PF) 1 % IJ SOLN
30.0000 mL | INTRAMUSCULAR | Status: AC | PRN
  Administered 2021-06-02: 30 mL via SUBCUTANEOUS
  Filled 2021-06-01: qty 30

## 2021-06-01 MED ORDER — FENTANYL CITRATE (PF) 100 MCG/2ML IJ SOLN
50.0000 ug | INTRAMUSCULAR | Status: DC | PRN
  Administered 2021-06-02 (×2): 50 ug via INTRAVENOUS
  Filled 2021-06-01 (×2): qty 2

## 2021-06-01 MED ORDER — MISOPROSTOL 25 MCG QUARTER TABLET: 25.0000 ug | ORAL_TABLET | ORAL | Status: DC | PRN

## 2021-06-01 MED ORDER — OXYTOCIN-SODIUM CHLORIDE 30-0.9 UT/500ML-% IV SOLN
2.5000 [IU]/h | INTRAVENOUS | Status: DC
  Administered 2021-06-02: 2.5 [IU]/h via INTRAVENOUS
  Filled 2021-06-01: qty 500

## 2021-06-01 MED ORDER — LABETALOL HCL 5 MG/ML IV SOLN: 40.0000 mg | INTRAVENOUS | Status: DC | PRN

## 2021-06-01 MED ORDER — SOD CITRATE-CITRIC ACID 500-334 MG/5ML PO SOLN: 30.0000 mL | ORAL | Status: DC | PRN

## 2021-06-01 MED ORDER — MISOPROSTOL 50MCG HALF TABLET
50.0000 ug | ORAL_TABLET | ORAL | Status: DC
  Administered 2021-06-01: 50 ug via ORAL

## 2021-06-01 MED ORDER — MAGNESIUM SULFATE 40 GM/1000ML IV SOLN
2.0000 g/h | INTRAVENOUS | Status: DC
  Filled 2021-06-01: qty 1000

## 2021-06-01 MED ORDER — LABETALOL HCL 5 MG/ML IV SOLN
INTRAVENOUS | Status: AC
  Filled 2021-06-01: qty 4

## 2021-06-01 MED ORDER — SODIUM CHLORIDE 0.9 % IV SOLN
5.0000 10*6.[IU] | Freq: Once | INTRAVENOUS | Status: AC
  Administered 2021-06-01: 5 10*6.[IU] via INTRAVENOUS
  Filled 2021-06-01: qty 5

## 2021-06-01 MED ORDER — LABETALOL HCL 5 MG/ML IV SOLN
20.0000 mg | Freq: Once | INTRAVENOUS | Status: AC
  Administered 2021-06-01: 20 mg via INTRAVENOUS

## 2021-06-01 NOTE — H&P (Addendum)
OBSTETRIC ADMISSION HISTORY AND PHYSICAL  Cynthia Valenzuela is a 25 y.o. female G48P0150 with IUP at 40w0dby 8 wk [place] for IOL for gTHN and A1GDM. She reports +FMs, no LOF, no VB, no blurry vision, headaches, peripheral edema, or RUQ pain.  She plans on breast feeding. She requests an IUD outpatient for birth control.  [facility]  Dating: By 8 wk [place]--->  Estimated Date of Delivery: 06/22/21  Sono:   _0 , CWD, normal anatomy, cephalic presentation, 24628M 11% EFW  Prenatal History/Complications:  Hx of cervical insufficiency, antepartum cerclage placed at 14wks removed 8/17, hx of 23 week loss, gHTN, A1GDM  Past Medical History: Past Medical History:  Diagnosis Date   COVID-19 08/2019   Headache    Infection    UTI   Ob[place] morbid [place]    Preterm labor     Past Surgical History: Past Surgical History:  Procedure Laterality Date   CERVICAL CERCLAGE     CERVICAL CERCLAGE N/A 12/26/2020   Procedure: CERCLAGE CERVICAL;  Surgeon: CMora Bellman MD;  Location: [facility];  Service: Gynecology;  Laterality: N/A;   WISDOM TOOTH EXTRACTION      Obstetrical History: OB History     Gravida  7   Para  1   Term  0   Preterm  1   AB  5   Living  0      SAB  4   IAB  1   Ectopic  0   Multiple  0   Live Births  1        Obstetric Comments  Preterm child deceased due to loss from incompetent cervix (Had cerclage)         Social History Social History   Socioeconomic History   Marital status: Married    Spouse name: Not on file   Number of children: 0   Years of education: Not on file   Highest education level: Not on file  Occupational History   Not on file  Tobacco Use   Smoking status: Former    Types: Cigarettes    Quit date: 11/21/2018    Years since quitting: 2.5   Smokeless tobacco: Never   Tobacco comments:    vapes  Vaping Use   Vaping Use: Former   Quit date: 11/08/2020   Substances:  Nicotine, Flavoring  Substance and Sexual Activity   Alcohol use: Not Currently    Comment: occasionally, weekends   Drug use: Not Currently    Types: Marijuana    Comment: 04/22/2015 quit using marijuana    Sexual activity: Yes    Birth control/protection: None  Other Topics Concern   Not on file  Social History Narrative   ** Merged History Encounter **       Social Determinants of Health   Financial Resource Strain: Not on file  Food Insecurity: No Food Insecurity   Worried About RCharity fundraiserin the Last Year: Never true   [place] the Last Year: Never true  Transportation Needs: No Transportation Needs   Lack of Transportation (Medical): No   Lack of Transportation (Non-Medical): No  Physical Activity: Not on file  Stress: Not on file  Social Connections: Not on file    Family History: Family History  Problem Relation Age of Onset   Alcohol abuse Father    Diabetes Mother    Diabetes Sister     Allergies: No Known  Allergies  Medications Prior to Admission  Medication Sig Dispense Refill Last Dose   acetaminophen (TYLENOL) 500 MG tablet Take 500-1,000 mg by mouth every 6 (six) hours as needed (pain).   06/01/2021   aspirin 81 MG chewable tablet Chew 1 tablet (81 mg total) by mouth daily. 30 tablet 6 06/01/2021   Blood Pressure Monitoring (BLOOD PRESSURE KIT) DEVI 1 Device by Does not apply route as needed. 1 each 0 05/31/2021   glucose blood test strip 1 each by Other route 4 (four) times daily. Use as instructed 100 each 4 05/31/2021   Prenatal Vit-Fe Fumarate-FA (PRENATAL VITAMIN PO) Take 1 tablet by mouth in the morning.   05/31/2021   Accu-Chek Softclix Lancets lancets To check blood sugars 4 times a day. 100 each 12      Review of Systems  All systems reviewed and negative except as stated in HPI  Blood pressure (!) 153/99, pulse 78, temperature 97.9 F (36.6 C), temperature source Oral, resp. rate 18, height 5' 5" (1.651 m), weight (!) 171.6  kg, last menstrual period 09/07/2020, unknown if currently breastfeeding.  General appearance: alert, cooperative, no distress, and morbidly obese Lungs: normal work of breathing on room air Heart: normal rate Abdomen: soft, gravid, nontender to palpation Extremities: Homans sign is negative, no sign of DVT Presentation: cephalic confirmed by BSUS Fetal monitoring: Baseline: 145 bpm, Variability: Good {> 6 bpm), and Decelerations: Absent Uterine activity: None Dilation: Closed Effacement (%): 100 Station: -3 Exam by:: Gwenlyn Perking, MD  Prenatal labs: ABO, Rh: --/--/A POS (08/25 1112) Antibody: NEG (08/25 1112) Rubella: 1.76 (02/21 1158) RPR: Non Reactive (02/21 1158)  HBsAg: Negative (02/21 1158)  HIV: Non Reactive (02/21 1158)  GBS: Positive/-- (08/17 1123)  A1c 6.4 Genetic screening  Low risk NIPS Anatomy US limited by maternal body habitus  Prenatal Transfer Tool  Maternal Diabetes: Yes:  Diabetes Type:  Diet controlled Genetic Screening: Normal Maternal Ultrasounds/Referrals: Other:limited by body habitus Fetal Ultrasounds or other Referrals: Fetal echo normal Maternal Substance Abuse:  No Significant Maternal Medications:  None Significant Maternal Lab Results: Group B Strep positive  Results for orders placed or performed during the hospital encounter of 06/01/21 (from the past 24 hour(s))  Resp Panel by RT-PCR (Flu A&B, Covid) Nasopharyngeal Swab   Collection Time: 06/01/21 10:38 AM   Specimen: Nasopharyngeal Swab; Nasopharyngeal(NP) swabs in vial transport medium  Result Value Ref Range   SARS Coronavirus 2 by RT PCR NEGATIVE NEGATIVE   Influenza A by PCR NEGATIVE NEGATIVE   Influenza B by PCR NEGATIVE NEGATIVE  CBC   Collection Time: 06/01/21 11:12 AM  Result Value Ref Range   WBC 10.1 4.0 - 10.5 K/uL   RBC 4.22 3.87 - 5.11 MIL/uL   Hemoglobin 12.4 12.0 - 15.0 g/dL   HCT 37.1 36.0 - 46.0 %   MCV 87.9 80.0 - 100.0 fL   MCH 29.4 26.0 - 34.0 pg   MCHC 33.4 30.0 -  36.0 g/dL   RDW 14.7 11.5 - 15.5 %   Platelets 354 150 - 400 K/uL   nRBC 0.0 0.0 - 0.2 %  Comprehensive metabolic panel   Collection Time: 06/01/21 11:12 AM  Result Value Ref Range   Sodium 137 135 - 145 mmol/L   Potassium 3.9 3.5 - 5.1 mmol/L   Chloride 110 98 - 111 mmol/L   CO2 18 (L) 22 - 32 mmol/L   Glucose, Bld 79 70 - 99 mg/dL   BUN 14 6 - 20 mg/dL  Creatinine, Ser 0.58 0.44 - 1.00 mg/dL   Calcium 9.0 8.9 - 10.3 mg/dL   Total Protein 5.9 (L) 6.5 - 8.1 g/dL   Albumin 2.6 (L) 3.5 - 5.0 g/dL   AST 22 15 - 41 U/L   ALT 15 0 - 44 U/L   Alkaline Phosphatase 211 (H) 38 - 126 U/L   Total Bilirubin 0.3 0.3 - 1.2 mg/dL   GFR, Estimated >60 >60 mL/min   Anion gap 9 5 - 15  Type and screen   Collection Time: 06/01/21 11:12 AM  Result Value Ref Range   ABO/RH(D) A POS    Antibody Screen NEG    Sample Expiration      06/04/2021,2359 Performed at [facility], [place] [address], [place], [place] [address]   Glucose, capillary   Collection Time: 06/01/21 11:28 AM  Result Value Ref Range   Glucose-Capillary 90 70 - 99 mg/dL  Protein / creatinine ratio, urine   Collection Time: 06/01/21 11:32 AM  Result Value Ref Range   Creatinine, Urine 118.81 mg/dL   Total Protein, Urine 181 mg/dL   Protein Creatinine Ratio 1.52 (H) 0.00 - 0.15 mg/mg[Cre]  Results for orders placed or performed in visit on 05/31/21 (from the past 24 hour(s))  CBC   Collection Time: 05/31/21  4:51 PM  Result Value Ref Range   WBC 10.0 3.4 - 10.8 x10E3/uL   RBC 4.54 3.77 - 5.28 x10E6/uL   Hemoglobin 13.1 11.1 - 15.9 g/dL   Hematocrit 40.3 34.0 - 46.6 %   MCV 89 79 - 97 fL   MCH 28.9 26.6 - 33.0 pg   MCHC 32.5 31.5 - 35.7 g/dL   RDW 13.7 11.7 - 15.4 %   Platelets 376 150 - 450 x10E3/uL  Comprehensive metabolic panel   Collection Time: 05/31/21  4:51 PM  Result Value Ref Range   Glucose 83 65 - 99 mg/dL   BUN 12 6 - 20 mg/dL   Creatinine, Ser 0.61 0.57 - 1.00 mg/dL   eGFR 126 >59 mL/min/1.73    BUN/Creatinine Ratio 20 9 - 23   Sodium 137 134 - 144 mmol/L   Potassium 4.3 3.5 - 5.2 mmol/L   Chloride 103 96 - 106 mmol/L   CO2 21 20 - 29 mmol/L   Calcium 9.8 8.7 - 10.2 mg/dL   Total Protein 6.0 6.0 - 8.5 g/dL   Albumin 3.4 (L) 3.9 - 5.0 g/dL   Globulin, Total 2.6 1.5 - 4.5 g/dL   Albumin/Globulin Ratio 1.3 1.2 - 2.2   Bilirubin Total <0.2 0.0 - 1.2 mg/dL   Alkaline Phosphatase 283 (H) 44 - 121 IU/L   AST 18 0 - 40 IU/L   ALT 13 0 - 32 IU/L    Patient Active Problem List   Diagnosis Date Noted   Gestational hypertension 06/01/2021   Gestational hypertension, third trimester 05/31/2021   GBS (group B Streptococcus carrier), +RV culture, currently pregnant 05/28/2021   Elevated BP without diagnosis of hypertension 03/21/2021   Cervical cerclage suture present, antepartum 03/02/2021   Gestational diabetes mellitus (GDM), antepartum 01/19/2021   BMI 60.0-69.9, adult ([place]) 12/19/2020   Supervision of high risk pregnancy, antepartum 11/21/2020   History of incompetent cervix, currently pregnant 11/21/2020   Obesity in pregnancy 11/21/2020   History of preterm delivery, currently pregnant 11/21/2020   Incompetent cervix 01/25/2016   Frequent headaches 12/08/2015    Assessment/Plan:  Heiley Shaikh is a 25 y.o. [id number] at 76w0dhere for IOL for  gHTN and A1GDM.  #Labor: Cervix is closed, though effaced. Likely 2/2 prior cerclage placement. Will start with buccal cytotec. Expect her cervical dilation to happen quickly given her history.  #Pain: PRN, planning epidural  #FWB: Cat I #ID:  GBS Pos >PCN #MOF: Breast #MOC: IUD outpatient #Circ:  Yes if boy #gHTN > Pre-Eclampsia: Last BP 144/90. PCR 1.52 with normal LFTs and platelets. No symptoms at this time. Will continue to monitor closely. #A1GDM: Glucose 90 on arrvial. Will continue to monitor with Q4hr glucose checks.  Genia Del, MD  OB Fellow  Faculty Practice 06/01/2021, 1:41 PM  GME ATTESTATION:  I saw and  evaluated the patient. I agree with the findings and the plan of care as documented in the resident's note. I have made changes to documentation as necessary.  SVE closed though thin. Cephalic confirmed by BSUS. Will start induction with Cytotec 50 mcg buccal. Elevated BP with PCR of 1.52. Meets criteria for pre-eclampsia though asymptomatic and mild range BP. Will continue to monitor closely. Q4h glucose checks.   Vilma Meckel, MD OB Fellow, [facility] [place] 06/01/2021 1:55 PM

## 2021-06-01 NOTE — Progress Notes (Signed)
Labor Progress Note Cynthia Valenzuela is a 25 y.o. G7P0150 at [redacted]w[redacted]d presented for IOL 2/2 gHTN and A1GDM.   S: Doing well, not really feeling contractions yet   O:  BP 132/65   Pulse 90   Temp 97.8 F (36.6 C) (Oral)   Resp (!) 21   Ht 5\' 5"  (1.651 m)   Wt (!) 171.6 kg   LMP 09/07/2020 (Exact Date)   SpO2 100%   BMI 62.94 kg/m  EFM: 145/mod/15x15  CVE: Dilation: 4.5 Effacement (%): 70 Cervical Position: Posterior Station: Ballotable Presentation: Vertex Exam by:: [email or web address], DO   A&P: 25 y.o. G7P0150 [redacted]w[redacted]d.  #Labor: Minimal progression since last check. Cont to titrate pit as tolerated. Hopeful for AROM/IUPC on next check when head better applied.  #Pain: PRN  #FWB: Cat 1  #GBS positive (PCN)   #A1GDM: Stable. CBGS appropriate.   #gHTN, now Pre-e with severe features: BP stable, no further severe ranges. Pre-e labs WNL w/ exception of elevated PCR. Cont to monitor. Mild headache, given tylenol and will reassess.   [redacted]w[redacted]d, DO 11:06 PM

## 2021-06-01 NOTE — Progress Notes (Signed)
Cynthia Valenzuela is a 25 y.o. G7P0150 at [redacted]w[redacted]d by 8 week ultrasound admitted for IOL secondary to gHTN and A1GDM.  Subjective: Patient is doing well. No concerns at this time.   Objective: BP (!) 164/107   Pulse 73   Temp 97.9 F (36.6 C) (Oral)   Resp 20   Ht 5\' 5"  (1.651 m)   Wt (!) 171.6 kg   LMP 09/07/2020 (Exact Date)   BMI 62.94 kg/m   FHT:  Baseline 140 bpm, moderate variability, + accels, - decels UC:   Irregular contractions SVE:   Dilation: 2 Effacement (%): 80 Station: -3 Exam by:: [email or web address] RN/J. Williams  Labs: Lab Results  Component Value Date   WBC 10.1 06/01/2021   HGB 12.4 06/01/2021   HCT 37.1 06/01/2021   MCV 87.9 06/01/2021   PLT 354 06/01/2021    Assessment / Plan: 25 y.o. G7P0150 at [redacted]w[redacted]d by 8 week ultrasound admitted for IOL secondary to gHTN and A1GDM.  Labor: SVE 2/80/-3. FB placed without complication. Will hold off on additional Cytotec at this time due to increased cramping and favorable exam. Suspect FB will come out sooner than 4 hours. Will place Pitocin once FB is out.  Fetal Wellbeing:  Category I Pain Control:  PRN; planning for epidural I/D:  GBS positive; PCN Anticipated MOD:  NSVD  #gHTN > Pre-Eclampsia: Last BP severe range. PCR 1.52 with normal LFTs and platelets on admission. Remains asymptomatic. Will recheck in 15 min. If BP remains in severe range, will discuss starting magnesium.  #A1GDM: Glucose has remained in normal range since admission. Continue Q4hr glucose checks.  11-26-1998, MD  OB Fellow  Faculty Practice 06/01/2021, 5:16 PM

## 2021-06-02 ENCOUNTER — Ambulatory Visit: Payer: Medicaid Other

## 2021-06-02 ENCOUNTER — Encounter (HOSPITAL_COMMUNITY): Payer: Self-pay | Admitting: Obstetrics and Gynecology

## 2021-06-02 DIAGNOSIS — O1414 Severe pre-eclampsia complicating childbirth: Secondary | ICD-10-CM

## 2021-06-02 DIAGNOSIS — Z8759 Personal history of other complications of pregnancy, childbirth and the puerperium: Secondary | ICD-10-CM

## 2021-06-02 DIAGNOSIS — O134 Gestational [pregnancy-induced] hypertension without significant proteinuria, complicating childbirth: Secondary | ICD-10-CM

## 2021-06-02 DIAGNOSIS — Z3A37 37 weeks gestation of pregnancy: Secondary | ICD-10-CM

## 2021-06-02 DIAGNOSIS — O24420 Gestational diabetes mellitus in childbirth, diet controlled: Secondary | ICD-10-CM

## 2021-06-02 DIAGNOSIS — O99820 Streptococcus B carrier state complicating pregnancy: Secondary | ICD-10-CM

## 2021-06-02 LAB — GLUCOSE, CAPILLARY
Glucose-Capillary: 103 mg/dL — ABNORMAL HIGH (ref 70–99)
Glucose-Capillary: 103 mg/dL — ABNORMAL HIGH (ref 70–99)

## 2021-06-02 LAB — RPR: RPR Ser Ql: NONREACTIVE

## 2021-06-02 MED ORDER — DIBUCAINE (PERIANAL) 1 % EX OINT: 1.0000 "application " | TOPICAL_OINTMENT | CUTANEOUS | Status: DC | PRN

## 2021-06-02 MED ORDER — COCONUT OIL OIL: 1.0000 "application " | TOPICAL_OIL | Status: DC | PRN

## 2021-06-02 MED ORDER — SIMETHICONE 80 MG PO CHEW: 80.0000 mg | CHEWABLE_TABLET | ORAL | Status: DC | PRN

## 2021-06-02 MED ORDER — BENZOCAINE-MENTHOL 20-0.5 % EX AERO: 1.0000 "application " | INHALATION_SPRAY | CUTANEOUS | Status: DC | PRN

## 2021-06-02 MED ORDER — SODIUM CHLORIDE 0.9% FLUSH
3.0000 mL | Freq: Two times a day (BID) | INTRAVENOUS | Status: DC
  Administered 2021-06-03: 3 mL via INTRAVENOUS

## 2021-06-02 MED ORDER — IBUPROFEN 600 MG PO TABS
600.0000 mg | ORAL_TABLET | Freq: Four times a day (QID) | ORAL | Status: DC
  Administered 2021-06-02 – 2021-06-04 (×7): 600 mg via ORAL
  Filled 2021-06-02 (×7): qty 1

## 2021-06-02 MED ORDER — ENOXAPARIN SODIUM 100 MG/ML IJ SOSY
0.5000 mg/kg | PREFILLED_SYRINGE | INTRAMUSCULAR | Status: DC
  Administered 2021-06-02 – 2021-06-03 (×2): 85 mg via SUBCUTANEOUS
  Filled 2021-06-02 (×3): qty 0.85

## 2021-06-02 MED ORDER — SODIUM CHLORIDE 0.9 % IV SOLN: 250.0000 mL | INTRAVENOUS | Status: DC | PRN

## 2021-06-02 MED ORDER — LACTATED RINGERS AMNIOINFUSION
INTRAVENOUS | Status: DC
  Filled 2021-06-02 (×7): qty 1000

## 2021-06-02 MED ORDER — PRENATAL MULTIVITAMIN CH
1.0000 | ORAL_TABLET | Freq: Every day | ORAL | Status: DC
  Administered 2021-06-02 – 2021-06-04 (×3): 1 via ORAL
  Filled 2021-06-02 (×3): qty 1

## 2021-06-02 MED ORDER — ACETAMINOPHEN 325 MG PO TABS
650.0000 mg | ORAL_TABLET | ORAL | Status: DC | PRN
  Administered 2021-06-03: 650 mg via ORAL
  Filled 2021-06-02: qty 2

## 2021-06-02 MED ORDER — WITCH HAZEL-GLYCERIN EX PADS: 1.0000 "application " | MEDICATED_PAD | CUTANEOUS | Status: DC | PRN

## 2021-06-02 MED ORDER — MEASLES, MUMPS & RUBELLA VAC IJ SOLR: 0.5000 mL | Freq: Once | INTRAMUSCULAR | Status: DC

## 2021-06-02 MED ORDER — SENNOSIDES-DOCUSATE SODIUM 8.6-50 MG PO TABS
2.0000 | ORAL_TABLET | ORAL | Status: DC
  Administered 2021-06-03 – 2021-06-04 (×2): 2 via ORAL
  Filled 2021-06-02 (×2): qty 2

## 2021-06-02 MED ORDER — ONDANSETRON HCL 4 MG/2ML IJ SOLN: 4.0000 mg | INTRAMUSCULAR | Status: DC | PRN

## 2021-06-02 MED ORDER — ONDANSETRON HCL 4 MG PO TABS
4.0000 mg | ORAL_TABLET | ORAL | Status: DC | PRN
  Administered 2021-06-04: 4 mg via ORAL
  Filled 2021-06-02: qty 1

## 2021-06-02 MED ORDER — TETANUS-DIPHTH-ACELL PERTUSSIS 5-2.5-18.5 LF-MCG/0.5 IM SUSY
0.5000 mL | PREFILLED_SYRINGE | Freq: Once | INTRAMUSCULAR | Status: AC
  Administered 2021-06-03: 0.5 mL via INTRAMUSCULAR
  Filled 2021-06-02: qty 0.5

## 2021-06-02 MED ORDER — SODIUM CHLORIDE 0.9% FLUSH: 3.0000 mL | INTRAVENOUS | Status: DC | PRN

## 2021-06-02 MED ORDER — DIPHENHYDRAMINE HCL 25 MG PO CAPS: 25.0000 mg | ORAL_CAPSULE | Freq: Four times a day (QID) | ORAL | Status: DC | PRN

## 2021-06-02 MED ORDER — NIFEDIPINE ER OSMOTIC RELEASE 30 MG PO TB24
30.0000 mg | ORAL_TABLET | Freq: Every day | ORAL | Status: DC
  Administered 2021-06-02 – 2021-06-03 (×2): 30 mg via ORAL
  Filled 2021-06-02 (×2): qty 1

## 2021-06-02 MED ORDER — MAGNESIUM SULFATE 40 GM/1000ML IV SOLN
2.0000 g/h | INTRAVENOUS | Status: DC
  Administered 2021-06-02: 2 g/h via INTRAVENOUS

## 2021-06-02 MED ORDER — ZOLPIDEM TARTRATE 5 MG PO TABS: 5.0000 mg | ORAL_TABLET | Freq: Every evening | ORAL | Status: DC | PRN

## 2021-06-02 NOTE — Progress Notes (Signed)
Labor Progress Note Cynthia Valenzuela is a 25 y.o. [id number] at [redacted]w[redacted]d presented for IOL due to gHTN (now pre-e with SF) and A1GDM.   S: Had recent possible leakage of fluid in the past 30 minutes. Since then has been having to breath through contractions and feeling shakes. Called by RN due to recurrent shallow variables on FHT despite position changes, having difficulty monitoring with certain positions as well.   O:  BP (!) 148/84   Pulse 80   Temp 97.8 F (36.6 C) (Oral)   Resp (!) 21   Ht 5\' 5"  (1.651 m)   Wt (!) 171.6 kg   LMP 09/07/2020 (Exact Date)   SpO2 100%   BMI 62.94 kg/m  EFM: 150/Mod var/none/early and recurrent shallow variables with recovery   CVE: Dilation: 4.5 Effacement (%): 80 Cervical Position: Posterior Station: Ballotable Presentation: Vertex Exam by:: [email or web address], DO   A&P: 25 y.o. 30 [redacted]w[redacted]d  #Labor: Minimal cervical dilation change, however improved cervical effacement and positioning. SROM confirmed with clear fluid, IUPC placed. Recurrent variables noted, increased since SROM. Will continue position changes as tolerated and anticipate further cervical change with recent ROM. Amnioinfusion started and 500cc IVF bolus ordered. Pit currently at 12, if persistent will half and consider FSE if needed.   #Pain: PRN  #FWB: Cat II, see above  #GBS positive PCN   #Pre-e with severe features: Currently asymptomatic. No further severe ranges, last 8/25 at 1720. Cont Mg.   #A1GDM: CBG stable. Monitor q4.   9/25, DO 2:24 AM

## 2021-06-02 NOTE — Discharge Summary (Addendum)
Postpartum Discharge Summary  Date of Service updated     Patient Name: Cynthia Valenzuela DOB: 16-Apr-1996 MRN: [id number]  Date of admission: 06/01/2021 Delivery date:06/02/2021  Delivering provider: Patriciaann Clan  Date of discharge: 06/04/2021  Admitting diagnosis: Gestational hypertension [O13.9] Intrauterine pregnancy: [redacted]w[redacted]d    Secondary diagnosis:  Active Problems:   Incompetent cervix   Supervision of high risk pregnancy, antepartum   History of incompetent cervix, currently pregnant   Obesity in pregnancy   Gestational diabetes mellitus (GDM), antepartum   Gestational hypertension   Severe pre-eclampsia, with delivery  Additional problems:     Discharge diagnosis: Term Pregnancy Delivered, Preeclampsia (severe), and GDM A1                                              Post partum procedures: None Augmentation: Pitocin, Cytotec, and OP Foley Complications: None  Hospital course: Induction of Labor With Vaginal Delivery   25y.o. yo [id number] admitted to the hospital 06/01/2021 for induction of labor.  Indication for induction: Gestational hypertension and Preeclampsia.  Patient had an uncomplicated labor course as follows: Membrane Rupture Time/Date: 2:15 AM ,06/02/2021   Delivery Method:Vaginal, Spontaneous  Episiotomy: None  Lacerations:  2nd degree  Details of delivery can be found in separate delivery note.  Patient had a routine postpartum course with the exception of her PreE with severe features. She was given Magnesium for 24 hours and is on Procardia 30 BID for blood pressure control which has been adequate.   Patient is discharged home 06/02/21.  Newborn Data: Birth date:06/02/2021  Birth time:5:38 AM  Gender:Female  Living status:Living  Apgars:8 ,9  Weight:2130 g   Magnesium Sulfate received: Yes: Seizure prophylaxis BMZ received: No Rhophylac:No MMR:No T-DaP:Given prenatally Flu: N/A Transfusion:No  Physical exam  Vitals:    06/02/21 0620 06/02/21 0635 06/02/21 0650 06/02/21 0700  BP: 107/76 119/64 (!) 105/56 115/62  Pulse: (!) 104 100 95 98  Resp:      Temp:      TempSrc:      SpO2:      Weight:      Height:       General: alert, cooperative, and no distress Lochia: appropriate Uterine Fundus: firm Incision: N/A DVT Evaluation: No evidence of DVT seen on physical exam. No cords or calf tenderness. Labs: Lab Results  Component Value Date   WBC 10.0 06/01/2021   HGB 12.1 06/01/2021   HCT 36.2 06/01/2021   MCV 88.5 06/01/2021   PLT 374 06/01/2021   CMP Latest Ref Rng & Units 06/01/2021  Glucose 70 - 99 mg/dL 79  BUN 6 - 20 mg/dL 14  Creatinine 0.44 - 1.00 mg/dL 0.58  Sodium 135 - 145 mmol/L 137  Potassium 3.5 - 5.1 mmol/L 3.9  Chloride 98 - 111 mmol/L 110  CO2 22 - 32 mmol/L 18(L)  Calcium 8.9 - 10.3 mg/dL 9.0  Total Protein 6.5 - 8.1 g/dL 5.9(L)  Total Bilirubin 0.3 - 1.2 mg/dL 0.3  Alkaline Phos 38 - 126 U/L 211(H)  AST 15 - 41 U/L 22  ALT 0 - 44 U/L 15   Edinburgh Score: No flowsheet data found.   After visit meds:  Allergies as of 06/04/2021   No Known Allergies      Medication List     STOP taking these medications  Accu-Chek Softclix Lancets lancets   aspirin 81 MG chewable tablet   Blood Pressure Kit Devi   glucose blood test strip       TAKE these medications    acetaminophen 325 MG tablet Commonly known as: Tylenol Take 2 tablets (650 mg total) by mouth every 4 (four) hours as needed (for pain scale < 4). What changed:  medication strength how much to take when to take this reasons to take this   ibuprofen 600 MG tablet Commonly known as: ADVIL Take 1 tablet (600 mg total) by mouth every 6 (six) hours.   NIFEdipine 30 MG 24 hr tablet Commonly known as: ADALAT CC Take 1 tablet (30 mg total) by mouth 2 (two) times daily.   prenatal multivitamin Tabs tablet Take 1 tablet by mouth daily at 12 noon. What changed: when to take this          Discharge home in stable condition Infant Feeding: Breast Infant Disposition:home with mother Discharge instruction: per After Visit Summary and Postpartum booklet. Activity: Advance as tolerated. Pelvic rest for 6 weeks.  Diet: routine diet Future Appointments: Future Appointments  Date Time Provider [place]  06/15/2021 10:20 AM [facility] NURSE [facility]  06/30/2021  9:35 AM Radene Gunning, MD [facility]   Follow up Visit:  Message sent by Dr. Higinio Plan to [facility] on 06/02/2021:   Please schedule this patient for a In person postpartum visit in 4 weeks with the following provider: MD. Additional Postpartum F/U:2 hour GTT and BP check 1 week  High risk pregnancy complicated by: GDM, HTN, and Severe pre-e Delivery mode:  Vaginal, Spontaneous  Anticipated Birth Control:  IUD (outpatient during postpartum appt)   Radene Gunning, MD

## 2021-06-02 NOTE — Lactation Note (Addendum)
This note was copied from a baby's chart. Lactation Consultation Note  Patient Name: Cynthia Valenzuela [id number] Date: 06/02/2021 Reason for consult: Initial assessment;Early term 37-38.6wks;Infant < 6lbs;1st time breastfeeding;Maternal endocrine disorder Age:26 hours  P2, (First child passed at 2 weeks, born early). This baby [redacted]w[redacted]d < 5 lbs. Mother had baby latched the second time LC visit with mother in football hold for 25 min..  Assisted with re-latching baby as she came off and on during feeding but had good periods of sucking. Discussed LPI feeding plan.   Mother consented to supplement with donor milk. Reviewed paced feeding with FOB.  Baby received 7 ml. Observed pumping. Drops expressed and give to baby on spoon.  Feed on demand with cues.  Goal 8-12+ times per day after first 24 hrs.  Place baby STS if not cueing.   Mother knows to pump after.   Lactation brochure given.   Maternal Data Has patient been taught Hand Expression?: Yes Does the patient have breastfeeding experience prior to this delivery?: No (pumped with first child)  Feeding Mother's Current Feeding Choice: Breast Milk and Donor Milk  LATCH Score Latch: Repeated attempts needed to sustain latch, nipple held in mouth throughout feeding, stimulation needed to elicit sucking reflex.  Audible Swallowing: A few with stimulation  Type of Nipple: Everted at rest and after stimulation  Comfort (Breast/Nipple): Soft / non-tender  Hold (Positioning): Assistance needed to correctly position infant at breast and maintain latch.  LATCH Score: 7   Lactation Tools Discussed/Used Tools: Pump Breast pump type: Double-Electric Breast Pump Pump Education: Setup, frequency, and cleaning;Milk Storage Reason for Pumping: stimulation and supplementation Pumping frequency:  (q 3 hours) Pumped volume:  (drops)  Interventions Interventions: Breast feeding basics reviewed;Assisted with latch;Skin to skin;Adjust  position;DEBP;Education;Pace feeding  Discharge WIC Program: Yes  Consult Status Consult Status: Follow-up Date: 06/03/21 Follow-up type: In-patient    Dahlia Byes [facility] 06/02/2021, 10:38 AM

## 2021-06-03 LAB — CBC
HCT: 33 % — ABNORMAL LOW (ref 36.0–46.0)
Hemoglobin: 11 g/dL — ABNORMAL LOW (ref 12.0–15.0)
MCH: 29.6 pg (ref 26.0–34.0)
MCHC: 33.3 g/dL (ref 30.0–36.0)
MCV: 88.7 fL (ref 80.0–100.0)
Platelets: 327 10*3/uL (ref 150–400)
RBC: 3.72 MIL/uL — ABNORMAL LOW (ref 3.87–5.11)
RDW: 15.1 % (ref 11.5–15.5)
WBC: 9.4 10*3/uL (ref 4.0–10.5)
nRBC: 0 % (ref 0.0–0.2)

## 2021-06-03 LAB — COMPREHENSIVE METABOLIC PANEL
ALT: 18 U/L (ref 0–44)
AST: 42 U/L — ABNORMAL HIGH (ref 15–41)
Albumin: 2.4 g/dL — ABNORMAL LOW (ref 3.5–5.0)
Alkaline Phosphatase: 178 U/L — ABNORMAL HIGH (ref 38–126)
Anion gap: 8 (ref 5–15)
BUN: 10 mg/dL (ref 6–20)
CO2: 19 mmol/L — ABNORMAL LOW (ref 22–32)
Calcium: 7.5 mg/dL — ABNORMAL LOW (ref 8.9–10.3)
Chloride: 108 mmol/L (ref 98–111)
Creatinine, Ser: 0.51 mg/dL (ref 0.44–1.00)
GFR, Estimated: >60 mL/min (ref >60)
Glucose, Bld: 101 mg/dL — ABNORMAL HIGH (ref 70–99)
Potassium: 4.5 mmol/L (ref 3.5–5.1)
Sodium: 135 mmol/L (ref 135–145)
Total Bilirubin: 1 mg/dL (ref 0.3–1.2)
Total Protein: 5.4 g/dL — ABNORMAL LOW (ref 6.5–8.1)

## 2021-06-03 MED ORDER — NIFEDIPINE ER OSMOTIC RELEASE 30 MG PO TB24
30.0000 mg | ORAL_TABLET | Freq: Two times a day (BID) | ORAL | Status: DC
  Administered 2021-06-03 – 2021-06-04 (×2): 30 mg via ORAL
  Filled 2021-06-03 (×2): qty 1

## 2021-06-03 NOTE — Lactation Note (Signed)
This note was copied from a baby's chart. Lactation Consultation Note  Patient Name: Girl Shadoe Cryan [id number] Date: 06/03/2021 Reason for consult: Follow-up assessment;Early term 37-38.6wks;Infant < 6lbs;1st time breastfeeding Age:26 hours  P1, Baby out of room for hearing screen. 6% weight loss. Baby is being supplemented with 10-15 of Donor Milk.  Inquired about baby's energy level when breastfeeding. Baby is becoming sleepy at breast. Suggest offering breast and if baby is not actively sucking to give baby supplement of donor milk with extra slow flow nipple.   Donor milk is being fortified for baby. Mother is pumping small amounts of colostrum. Encouraged mother to pump q 3 hours.  Maternal Data Does the patient have breastfeeding experience prior to this delivery?: No  Feeding Mother's Current Feeding Choice: Breast Milk and Donor Milk   Interventions Interventions: Education  Discharge Pump:  (Spoke with WIC and will pick up pump on Monday, if not they will do a [facility] loaner)  Consult Status Consult Status: Follow-up Date: 06/04/21 Follow-up type: In-patient    Dahlia Byes [facility] 06/03/2021, 10:50 AM

## 2021-06-03 NOTE — Progress Notes (Signed)
Daily Postpartum Note  Admission Date: 06/01/2021 Current Date: 06/03/2021   Cynthia Valenzuela is a 25 y.o. [id number] PPD#1 s/p SVD/2nd degree @ [redacted]w[redacted]d  Patient admitted for IOL for gHTN that evolved in severe pre-x based on BPs.  Pregnancy complicated by: Patient Active Problem List   Diagnosis Date Noted   Severe pre-eclampsia, with delivery 06/02/2021   Gestational hypertension 06/01/2021   Gestational hypertension, third trimester 05/31/2021   GBS (group B Streptococcus carrier), +RV culture, currently pregnant 05/28/2021   Elevated BP without diagnosis of hypertension 03/21/2021   Cervical cerclage suture present, antepartum 03/02/2021   Gestational diabetes mellitus (GDM), antepartum 01/19/2021   BMI 60.0-69.9, adult ([place] 12/19/2020   Supervision of high risk pregnancy, antepartum 11/21/2020   History of incompetent cervix, currently pregnant 11/21/2020   Obesity in pregnancy 11/21/2020   History of preterm delivery, currently pregnant 11/21/2020   Incompetent cervix 01/25/2016   Frequent headaches 12/08/2015    Overnight/24hr events:  none  Subjective:  No s/s of Mg toxicity or pre-eclampsia; pt doing well  Objective:    Patient Vitals for the past 24 hrs:  BP Temp Temp src Pulse Resp SpO2  06/03/21 0815 131/84 -- -- 87 -- 100 %  06/03/21 0811 131/84 97.8 F (36.6 C) -- 96 18 100 %  06/03/21 0316 131/77 98 F (36.7 C) Oral 88 18 100 %  06/02/21 2346 132/78 98 F (36.7 C) Oral 97 18 100 %  06/02/21 1943 138/84 98 F (36.7 C) Oral 93 18 --  06/02/21 1723 139/60 98.2 F (36.8 C) Oral 95 18 --    Physical exam: General: Well nourished, well developed female in no acute distress. Pt breastfeeding Abdomen: soft, nttp, obese Respiratory: No respiratory distress Extremities: no clubbing, cyanosis or edema Skin: Warm and dry.   Medications: Current Facility-Administered Medications  Medication Dose Route Frequency Provider Last Rate Last Admin   sodium chloride  flush (NS) 0.9 % injection 3 mL  3 mL Intravenous Q12H Beard, Samantha N, DO   3 mL at 06/03/21 1406   And   sodium chloride flush (NS) 0.9 % injection 3 mL  3 mL Intravenous PRN BHiginio Plan Samantha N, DO       And   0.9 %  sodium chloride infusion  250 mL Intravenous PRN BHiginio Plan Samantha N, DO       acetaminophen (TYLENOL) tablet 650 mg  650 mg Oral Q4H PRN BHiginio Plan Samantha N, DO   650 mg at 06/03/21 1220   benzocaine-Menthol (DERMOPLAST) 20-0.5 % topical spray 1 application  1 application Topical PRN Beard, Samantha N, DO       coconut oil  1 application Topical PRN BPatriciaann Clan DO       witch hazel-glycerin (TUCKS) pad 1 application  1 application Topical PRN BHiginio Plan Samantha N, DO       And   dibucaine (NUPERCAINAL) 1 % rectal ointment 1 application  1 application Rectal PRN BPatriciaann Clan DO       diphenhydrAMINE (BENADRYL) capsule 25 mg  25 mg Oral Q6H PRN Beard, Samantha N, DO       enoxaparin (LOVENOX) injection 85 mg  0.5 mg/kg Subcutaneous Q24H PAletha Halim MD   85 mg at 06/02/21 1722   labetalol (NORMODYNE) injection 20 mg  20 mg Intravenous PRN AGenia Del MD       And   labetalol (NORMODYNE) injection 40 mg  40 mg Intravenous PRN AGenia Del MD  And   labetalol (NORMODYNE) injection 80 mg  80 mg Intravenous PRN Genia Del, MD       And   hydrALAZINE (APRESOLINE) injection 10 mg  10 mg Intravenous PRN Genia Del, MD       ibuprofen (ADVIL) tablet 600 mg  600 mg Oral Q6H Beard, Samantha N, DO   600 mg at 06/03/21 1217   measles, mumps & rubella vaccine (MMR) injection 0.5 mL  0.5 mL Subcutaneous Once Beard, Samantha N, DO       NIFEdipine (PROCARDIA-XL/NIFEDICAL-XL) 24 hr tablet 30 mg  30 mg Oral Daily Ozan, Jennifer, DO   30 mg at 06/03/21 1026   ondansetron (ZOFRAN) tablet 4 mg  4 mg Oral Q4H PRN Darrelyn Hillock N, DO       Or   ondansetron (ZOFRAN) injection 4 mg  4 mg Intravenous Q4H PRN Higinio Plan, Samantha N, DO       prenatal  multivitamin tablet 1 tablet  1 tablet Oral Q1200 Higinio Plan, Samantha N, DO   1 tablet at 06/03/21 1217   senna-docusate (Senokot-S) tablet 2 tablet  2 tablet Oral Q24H Darrelyn Hillock N, DO   2 tablet at 06/03/21 1026   simethicone (MYLICON) chewable tablet 80 mg  80 mg Oral PRN Patriciaann Clan, DO       zolpidem (AMBIEN) tablet 5 mg  5 mg Oral QHS PRN Patriciaann Clan, DO        Labs:  Recent Labs  Lab 06/01/21 1112 06/01/21 1824 06/03/21 0356  WBC 10.1 10.0 9.4  HGB 12.4 12.1 11.0*  HCT 37.1 36.2 33.0*  PLT 354 374 327    Recent Labs  Lab 05/31/21 1651 06/01/21 1112 06/03/21 0356  NA 137 137 135  K 4.3 3.9 4.5  CL 103 110 108  CO2 21 18* 19*  BUN 12 14 10   CREATININE 0.61 0.58 0.51  CALCIUM 9.8 9.0 7.5*  PROT 6.0 5.9* 5.4*  BILITOT <0.2 0.3 1.0  ALKPHOS 283* 211* 178*  ALT 13 15 18   AST 18 22 42*  GLUCOSE 83 79 101*     Radiology:  none  Assessment & Plan:  Pt doing well *PP: routine care. Breast. A POS. BC d/w her, pt undecided. Girl *Severe pre-eclampsia: continue procardia. Mg off at 0600 this morning *PPx: lovenox due to bmi *FEN/GI: regular diet *Dispo: likely home tomorrow  Aletha Halim, Brooke Bonito MD Attending Center for [place] (Faculty Practice) GYN Consult Phone: [phone] (M-F, [phone]) & [phone]  (Off hours, weekends, holidays)

## 2021-06-04 MED ORDER — IBUPROFEN 600 MG PO TABS
600.0000 mg | ORAL_TABLET | Freq: Four times a day (QID) | ORAL | 0 refills | Status: DC
Start: 2021-06-04 — End: 2022-02-13

## 2021-06-04 MED ORDER — ACETAMINOPHEN 325 MG PO TABS: 650.0000 mg | ORAL_TABLET | ORAL | Status: DC | PRN

## 2021-06-04 MED ORDER — PRENATAL MULTIVITAMIN CH: 1.0000 | ORAL_TABLET | Freq: Every day | ORAL | 1 refills | Status: AC

## 2021-06-04 MED ORDER — NIFEDIPINE ER 30 MG PO TB24: 30.0000 mg | ORAL_TABLET | Freq: Two times a day (BID) | ORAL | 1 refills | Status: DC

## 2021-06-04 NOTE — Lactation Note (Addendum)
This note was copied from a baby's chart. Lactation Consultation Note  Patient Name: Cynthia Valenzuela [id number] Date: 06/04/2021 Reason for consult: Follow-up assessment;Infant < 6lbs;1st time breastfeeding Age:26 hours  P1, < 5 lbs.  Weight stabilized.  Started fortified DM yesterday and now using Nfant nipple. Mother allows baby to briefly breastfeed or nuzzle at breast before supplementation is given. Mother has been using hand pump, states she likes it better but has not been pumping 8 times per day. Discussed hands free pumping.   Suggest using DEBP for efficiency also and for a minimum of 8 times per day with 27 flanges. Discussed increasing volume per day of life and as baby desires.  Family will use formula once home with breastmilk supplementation until supply is full.  Reviewed engorgement care and monitoring voids/stools. Provided WIC paperwork in case discharge happens today.  Maternal Data Does the patient have breastfeeding experience prior to this delivery?: No  Feeding Mother's Current Feeding Choice: Breast Milk and Donor Milk Nipple Type: Nfant Slow Flow (purple)   Lactation Tools Discussed/Used Tools: Pump;Flanges Flange Size: 27 Breast pump type: Double-Electric Breast Pump;Manual Reason for Pumping: stimulation and supplementation Pumping frequency: recommend q 3 hours Pumped volume: 5 mL  Interventions Interventions: Breast feeding basics reviewed;DEBP;Hand pump;Education;Pace feeding  Discharge Discharge Education: Engorgement and breast care;Warning signs for feeding baby Pump:  (Mother has 11a appt tomorrow/WIC paperwork given) WIC Program: Yes  Consult Status Consult Status: Complete Date: 06/04/21    Cynthia Valenzuela [facility] 06/04/2021, 8:57 AM

## 2021-06-09 ENCOUNTER — Ambulatory Visit: Payer: Medicaid Other

## 2021-06-14 ENCOUNTER — Telehealth (HOSPITAL_COMMUNITY): Payer: Self-pay | Admitting: *Deleted

## 2021-06-14 NOTE — Telephone Encounter (Signed)
Attempted hospital discharge follow-up call. Left message for patient to return RN call. Deforest Hoyles, RN, 06/14/21, 1729.

## 2021-06-15 ENCOUNTER — Ambulatory Visit: Payer: Medicaid Other

## 2021-06-15 ENCOUNTER — Telehealth: Payer: Self-pay

## 2021-06-15 NOTE — Telephone Encounter (Signed)
Called pt to follow up on missed BP check appt this morning. VM left with callback number. MyChart message sent.

## 2021-06-29 ENCOUNTER — Encounter: Payer: Self-pay | Admitting: General Practice

## 2021-06-30 ENCOUNTER — Other Ambulatory Visit: Payer: Self-pay

## 2021-06-30 ENCOUNTER — Ambulatory Visit (INDEPENDENT_AMBULATORY_CARE_PROVIDER_SITE_OTHER): Payer: Medicaid Other | Admitting: Obstetrics and Gynecology

## 2021-06-30 ENCOUNTER — Encounter: Payer: Self-pay | Admitting: Obstetrics and Gynecology

## 2021-06-30 DIAGNOSIS — K625 Hemorrhage of anus and rectum: Secondary | ICD-10-CM | POA: Diagnosis not present

## 2021-06-30 MED ORDER — NORETHIN ACE-ETH ESTRAD-FE 1-20 MG-MCG PO TABS: 1.0000 | ORAL_TABLET | Freq: Every day | ORAL | 11 refills | Status: DC

## 2021-06-30 NOTE — Progress Notes (Signed)
Post Partum Visit Note  Cynthia Valenzuela is a 25 y.o. [phone] female who presents for a postpartum visit. She is 4 weeks postpartum following a normal spontaneous vaginal delivery.  I have fully reviewed the prenatal and intrapartum course. The delivery was at 37/1 gestational weeks.  Anesthesia: local. Postpartum course has been complicated by svere PreE. Baby is doing well. Baby is feeding by both breast and bottle - Carn[place] Good Start. Bleeding staining only. Bowel function is  Constipation . Bladder function is normal. Patient is not sexually active. Contraception method is none. Postpartum depression screening: negative.  She does note some pain rectally and some intermittent rectal bleeding. She just started the hemorrhoid therapies OTC 2 days ago. She will give it more time for treating.   The pregnancy intention screening data noted above was reviewed. Potential methods of contraception were discussed. The patient elected to proceed with OCP.    Edinburgh Postnatal Depression Scale - 06/30/21 0937       Edinburgh Postnatal Depression Scale:  In the Past 7 Days   I have been able to laugh and see the funny side of things. 0    I have looked forward with enjoyment to things. 0    I have blamed myself unnecessarily when things went wrong. 0    I have been anxious or worried for no good reason. 0    I have felt scared or panicky for no good reason. 0    Things have been getting on top of me. 0    I have been so unhappy that I have had difficulty sleeping. 0    I have felt sad or miserable. 0    I have been so unhappy that I have been crying. 0    The thought of harming myself has occurred to me. 0    Edinburgh Postnatal Depression Scale Total 0             Health Maintenance Due  Topic Date Due   URINE MICROALBUMIN  Never done   HPV VACCINES (1 - 2-dose series) Never done   INFLUENZA VACCINE  Never done    The following portions of the patient's history were reviewed  and updated as appropriate: allergies, current medications, past family history, past medical history, past social history, past surgical history, and problem list.  Review of Systems Pertinent items are noted in HPI.  Objective:  BP 115/67   Pulse 73   Ht 5\' 5"  (1.651 m)   Wt (!) 354 lb 9.6 oz (160.8 kg)   BMI 59.01 kg/m    General:  alert and no distress   Breasts:  not indicated  Lungs: clear to auscultation bilaterally  Heart:  regular rate and rhythm  Abdomen: soft, non-tender; bowel sounds normal; no masses,  no organomegaly   Wound N/a  GU exam:  not indicated       Assessment:    There are no diagnoses linked to this encounter.  4 wks postpartum exam.   Plan:   Essential components of care per ACOG recommendations:  1.  Mood and well being: Patient with negative depression screening today. Reviewed local resources for support.  - Patient tobacco use? No.   - hx of drug use? No.    2. Infant care and feeding:  -Patient currently breastmilk feeding? Yes. Reviewed importance of draining breast regularly to support lactation.  -Social determinants of health (SDOH) reviewed in EPIC. No concerns  3. Sexuality, contraception and birth  spacing - Patient does not want a pregnancy in the next year.  Desired family size is unknown children but maybe one more. Plans gastric bypass in next year - knows delay child bearing typically 18 months.  - Reviewed forms of contraception in tiered fashion. Patient desired oral contraceptives (estrogen/progesterone) today.  We did discussed LARC options. She tried IUD in the past and had side effects of swelling and weight gain. She will consider the Nexplanon.  - Discussed birth spacing of 18 months  4. Sleep and fatigue -Encouraged family/partner/community support of 4 hrs of uninterrupted sleep to help with mood and fatigue  5. Physical Recovery  - Discussed patients delivery and complications. She describes her labor as good. -  Patient had a Vaginal, no problems at delivery. Patient had a 2nd degree laceration. Perineal healing reviewed. Patient expressed understanding - Patient has urinary incontinence? No. - Patient is safe to resume physical and sexual activity  6.  Health Maintenance - HM due items addressed Yes - Last pap smear  Diagnosis  Date Value Ref Range Status  11/28/2020   Final   - Negative for intraepithelial lesion or malignancy (NILM)   Pap smear not done at today's visit.  -Breast Cancer screening indicated? No.   7. Chronic Disease/Pregnancy Condition follow up: Gestational Diabetes. Ordered 2hr GTT for 6 weeks from now. Discussed with pt.  - Can stop procardia  - PCP follow up  Milas Hock, MD Center for [facility], [facility]

## 2021-06-30 NOTE — Progress Notes (Signed)
Pt states wants BC pills.  Has been having constipation, is seeing blood in stool, has hemorrhoids also.

## 2021-07-08 ENCOUNTER — Encounter: Payer: Self-pay | Admitting: Radiology

## 2021-07-17 ENCOUNTER — Ambulatory Visit: Payer: Medicaid Other | Admitting: Obstetrics and Gynecology

## 2021-07-17 ENCOUNTER — Encounter: Payer: Self-pay | Admitting: Obstetrics and Gynecology

## 2021-07-17 ENCOUNTER — Other Ambulatory Visit: Payer: Self-pay

## 2021-07-17 DIAGNOSIS — K5900 Constipation, unspecified: Secondary | ICD-10-CM | POA: Diagnosis not present

## 2021-07-17 MED ORDER — HYDROCORT-PRAMOXINE (PERIANAL) 1-1 % EX FOAM: 1.0000 | Freq: Two times a day (BID) | CUTANEOUS | Status: DC

## 2021-07-17 NOTE — Progress Notes (Signed)
  CC: pain with defecation Subjective:    Patient ID: Cynthia Valenzuela, female    DOB: April 16, 1996, 25 y.o.   MRN: [id number]  HPI Pt seen.  Recent SVD 06/02/21.  Pt has also had her postpartum visit.  Primary complaint today was concern regarding pain with defecation and intermittent bloody stool.  Pt notes she will have intermittent sharp pain in that region when she defecates.  Pt was unsureif she had any hemorrhoids.   Review of Systems     Objective:   Physical Exam Vitals:   07/17/21 1517  BP: (!) 113/45  Pulse: 70   Genitalia:  second degree tear well healed. Hypopigmented area on bilateral inner buttocks. No obvious hemorrhoids externally.          Assessment & Plan:   1. Dyschezia Pt will try OTC dulcolax and rx for proctofoam.  If discomfort or bleeding continues, pt advised to seek care for evaluation with a general surgeon to see if she has any internal hemorrhoids. - hydrocortisone-pramoxine (PROCTOFOAM-HC) rectal foam 1 applicator  F/u prn  Warden Fillers, MD Faculty Attending, Center for [facility]

## 2021-07-23 ENCOUNTER — Other Ambulatory Visit (INDEPENDENT_AMBULATORY_CARE_PROVIDER_SITE_OTHER): Payer: Self-pay

## 2021-10-08 NOTE — L&D Delivery Note (Addendum)
I was called to room 10 as faculty attendings were both in OR cases. Introduced myself to patient, aware of situation, briefed by staff. Approx 5--6cm forebag protruding beyond introitus, clear SROM. Fetal vertex at introitus, patient with urge to push. Pushed with good control, no pain medication at this time. Pushed and delivered live but non-viable female infant in totality at 32. Spontaneous movements noted. Cord milked, clamped after one minute. Cut and passed infant onto mother's chest. Repeat vaginal exam revealed an intact cerclage with multiple knots at 11-12o'clock but approx 2-3cm segment that had pulled through cervix as stated in earlier notes. Very tense bag now present at 0 station. Given turgid nature and amount of bag protruding beyond cerclage, did not feel comfortable attempting to lift and replace bag. Patient now feeling pressure, pushed twice and spontaneously delivered second live but non-viable female infant; SROM occurred at time of delivery. Spontaneous movement of limbs noted again. Two umbilical clamps placed, cord milk, cut and camped, infant placed on mother's chest. At this time, Dr Alysia Penna of faculty presented and took over care in regards to placental delivery and removal of cerlcage Baby A 1459, vtx Baby B 1505, vtx

## 2022-02-13 ENCOUNTER — Inpatient Hospital Stay (HOSPITAL_COMMUNITY)
Admission: AD | Admit: 2022-02-13 | Discharge: 2022-02-13 | Disposition: A | Discharge status: Home / Self Care | Payer: Medicaid Other | Attending: Obstetrics and Gynecology | Admitting: Obstetrics and Gynecology

## 2022-02-13 ENCOUNTER — Inpatient Hospital Stay (HOSPITAL_COMMUNITY): Payer: Medicaid Other

## 2022-02-13 ENCOUNTER — Other Ambulatory Visit: Payer: Self-pay

## 2022-02-13 ENCOUNTER — Encounter (HOSPITAL_COMMUNITY): Payer: Self-pay | Admitting: Obstetrics and Gynecology

## 2022-02-13 DIAGNOSIS — O30041 Twin pregnancy, dichorionic/diamniotic, first trimester: Secondary | ICD-10-CM | POA: Diagnosis not present

## 2022-02-13 DIAGNOSIS — Z3A11 11 weeks gestation of pregnancy: Secondary | ICD-10-CM | POA: Insufficient documentation

## 2022-02-13 DIAGNOSIS — O26891 Other specified pregnancy related conditions, first trimester: Secondary | ICD-10-CM | POA: Insufficient documentation

## 2022-02-13 DIAGNOSIS — R109 Unspecified abdominal pain: Secondary | ICD-10-CM | POA: Diagnosis not present

## 2022-02-13 LAB — URINALYSIS, ROUTINE W REFLEX MICROSCOPIC
Bilirubin Urine: NEGATIVE
Glucose, UA: NEGATIVE mg/dL
Hgb urine dipstick: NEGATIVE
Ketones, ur: NEGATIVE mg/dL
Leukocytes,Ua: NEGATIVE
Nitrite: NEGATIVE
Protein, ur: NEGATIVE mg/dL
Specific Gravity, Urine: 1.028 (ref 1.005–1.030)
pH: 5 (ref 5.0–8.0)

## 2022-02-13 LAB — CBC
HCT: 38 % (ref 36.0–46.0)
Hemoglobin: 12.5 g/dL (ref 12.0–15.0)
MCH: 28 pg (ref 26.0–34.0)
MCHC: 32.9 g/dL (ref 30.0–36.0)
MCV: 85.2 fL (ref 80.0–100.0)
Platelets: 373 10*3/uL (ref 150–400)
RBC: 4.46 MIL/uL (ref 3.87–5.11)
RDW: 14.9 % (ref 11.5–15.5)
WBC: 8.5 10*3/uL (ref 4.0–10.5)
nRBC: 0 % (ref 0.0–0.2)

## 2022-02-13 LAB — HCG, QUANTITATIVE, PREGNANCY: hCG, Beta Chain, Quant, S: 131186 m[IU]/mL — ABNORMAL HIGH (ref <5)

## 2022-02-13 LAB — WET PREP, GENITAL
Clue Cells Wet Prep HPF POC: NONE SEEN
Sperm: NONE SEEN
Trich, Wet Prep: NONE SEEN
WBC, Wet Prep HPF POC: <10 (ref <10)
Yeast Wet Prep HPF POC: NONE SEEN

## 2022-02-13 LAB — POCT PREGNANCY, URINE: Preg Test, Ur: POSITIVE — AB

## 2022-02-13 NOTE — Discharge Instructions (Signed)

## 2022-02-13 NOTE — MAU Provider Note (Signed)
?History  ?  ? ?CSN: [id number] ? ?Arrival date and time: 02/13/22 Cynthia Valenzuela ? ? Event Date/Time  ? First Provider Initiated Contact with Patient 02/13/22 2109   ?  ? ?Chief Complaint  ?Patient presents with  ? Abdominal Pain  ? ?HPI ?Cynthia Valenzuela is a 26 y.o. [id number] at [redacted]w[redacted]d who presents with lower abdominal cramping since this am. She reports the cramping is a 6/10 and she has not tried anything for the pain. She denies any bleeding or discharge. She reports a positive home pregnancy test but reports feeling like she is in denial about the positive result. She has an 70 month old at home.  ? ?OB History   ? ? Gravida  ?8  ? Para  ?2  ? Term  ?1  ? Preterm  ?1  ? AB  ?5  ? Living  ?1  ?  ? ? SAB  ?4  ? IAB  ?1  ? Ectopic  ?0  ? Multiple  ?0  ? Live Births  ?2  ?   ?  ? Obstetric Comments  ?Preterm child deceased due to loss from incompetent cervix (Had cerclage)  ?  ? ?  ? ? ?Past Medical History:  ?Diagnosis Date  ? COVID-19 08/2019  ? Headache   ? Infection   ? UTI  ? Obesities, morbid (HCC)   ? Preterm labor   ? ? ?Past Surgical History:  ?Procedure Laterality Date  ? CERVICAL CERCLAGE    ? CERVICAL CERCLAGE N/A 12/26/2020  ? Procedure: CERCLAGE CERVICAL;  Surgeon: Catalina Antigua, MD;  Location: [facility];  Service: Gynecology;  Laterality: N/A;  ? WISDOM TOOTH EXTRACTION    ? ? ?Family History  ?Problem Relation Age of Onset  ? Alcohol abuse Father   ? Diabetes Mother   ? Diabetes Sister   ? ? ?Social History  ? ?Tobacco Use  ? Smoking status: Former  ?  Types: Cigarettes  ?  Quit date: 11/21/2018  ?  Years since quitting: 3.2  ? Smokeless tobacco: Never  ? Tobacco comments:  ?  vapes  ?Vaping Use  ? Vaping Use: Former  ? Quit date: 11/08/2020  ? Substances: Nicotine, Flavoring  ?Substance Use Topics  ? Alcohol use: Not Currently  ?  Comment: occasionally, weekends  ? Drug use: Not Currently  ?  Types: Marijuana  ?  Comment: 04/22/2015 quit using marijuana   ? ? ?Allergies: No Known Allergies ? ?Facility-Administered  Medications Prior to Admission  ?Medication Dose Route Frequency Provider Last Rate Last Admin  ? hydrocortisone-pramoxine (PROCTOFOAM-HC) rectal foam 1 applicator  1 applicator Rectal BID Warden Fillers, MD      ? ?Medications Prior to Admission  ?Medication Sig Dispense Refill Last Dose  ? acetaminophen (TYLENOL) 325 MG tablet Take 2 tablets (650 mg total) by mouth every 4 (four) hours as needed (for pain scale < 4). (Patient not taking: Reported on 07/17/2021)     ? ibuprofen (ADVIL) 600 MG tablet Take 1 tablet (600 mg total) by mouth every 6 (six) hours. (Patient not taking: Reported on 07/17/2021) 30 tablet 0   ? NIFEdipine (ADALAT CC) 30 MG 24 hr tablet Take 1 tablet (30 mg total) by mouth 2 (two) times daily. (Patient not taking: Reported on 07/17/2021) 30 tablet 1   ? norethindrone-ethinyl estradiol-FE (JUNEL FE 1/20) 1-20 MG-MCG tablet Take 1 tablet by mouth daily. 28 tablet 11   ? Prenatal Vit-Fe Fumarate-FA (PRENATAL MULTIVITAMIN) TABS tablet Take 1  tablet by mouth daily at 12 noon. 90 tablet 1   ? ? ?Review of Systems  ?Constitutional: Negative.  Negative for fatigue and fever.  ?HENT: Negative.    ?Respiratory: Negative.  Negative for shortness of breath.   ?Cardiovascular: Negative.  Negative for chest pain.  ?Gastrointestinal:  Positive for abdominal pain. Negative for constipation, diarrhea, nausea and vomiting.  ?Genitourinary: Negative.  Negative for dysuria, vaginal bleeding and vaginal discharge.  ?Neurological: Negative.  Negative for dizziness and headaches.  ?Physical Exam  ? ?Blood pressure 138/68, pulse 85, temperature 98.6 ?F (37 ?C), resp. rate 17, height 5\' 5"  (1.651 m), weight (!) 168.3 kg, last menstrual period 11/26/2021, SpO2 99 %, not currently breastfeeding. ? ?Physical Exam ?Vitals and nursing note reviewed.  ?Constitutional:   ?   General: She is not in acute distress. ?   Appearance: She is well-developed.  ?HENT:  ?   Head: Normocephalic.  ?Eyes:  ?   Pupils: Pupils are equal,  round, and reactive to light.  ?Cardiovascular:  ?   Rate and Rhythm: Normal rate and regular rhythm.  ?   Heart sounds: Normal heart sounds.  ?Pulmonary:  ?   Effort: Pulmonary effort is normal. No respiratory distress.  ?   Breath sounds: Normal breath sounds.  ?Abdominal:  ?   General: Bowel sounds are normal. There is no distension.  ?   Palpations: Abdomen is soft.  ?   Tenderness: There is no abdominal tenderness.  ?Skin: ?   General: Skin is warm and dry.  ?Neurological:  ?   Mental Status: She is alert and oriented to person, place, and time.  ?Psychiatric:     ?   Mood and Affect: Mood normal.     ?   Behavior: Behavior normal.     ?   Thought Content: Thought content normal.     ?   Judgment: Judgment normal.  ? ? ?MAU Course  ?Procedures ?Results for orders placed or performed during the hospital encounter of 02/13/22 (from the past 24 hour(s))  ?Pregnancy, urine POC     Status: Abnormal  ? Collection Time: 02/13/22  6:36 PM  ?Result Value Ref Range  ? Preg Test, Ur POSITIVE (A) NEGATIVE  ?Urinalysis, Routine w reflex microscopic Urine, Clean Catch     Status: Abnormal  ? Collection Time: 02/13/22  6:40 PM  ?Result Value Ref Range  ? Color, Urine YELLOW YELLOW  ? APPearance HAZY (A) CLEAR  ? Specific Gravity, Urine 1.028 1.005 - 1.030  ? pH 5.0 5.0 - 8.0  ? Glucose, UA NEGATIVE NEGATIVE mg/dL  ? Hgb urine dipstick NEGATIVE NEGATIVE  ? Bilirubin Urine NEGATIVE NEGATIVE  ? Ketones, ur NEGATIVE NEGATIVE mg/dL  ? Protein, ur NEGATIVE NEGATIVE mg/dL  ? Nitrite NEGATIVE NEGATIVE  ? Leukocytes,Ua NEGATIVE NEGATIVE  ?Wet prep, genital     Status: None  ? Collection Time: 02/13/22  9:20 PM  ? Specimen: Vaginal  ?Result Value Ref Range  ? Yeast Wet Prep HPF POC NONE SEEN NONE SEEN  ? Trich, Wet Prep NONE SEEN NONE SEEN  ? Clue Cells Wet Prep HPF POC NONE SEEN NONE SEEN  ? WBC, Wet Prep HPF POC <10 <10  ? Sperm NONE SEEN   ?CBC     Status: None  ? Collection Time: 02/13/22  9:45 PM  ?Result Value Ref Range  ? WBC  8.5 4.0 - 10.5 K/uL  ? RBC 4.46 3.87 - 5.11 MIL/uL  ? Hemoglobin 12.5 12.0 -  15.0 g/dL  ? HCT 38.0 36.0 - 46.0 %  ? MCV 85.2 80.0 - 100.0 fL  ? MCH 28.0 26.0 - 34.0 pg  ? MCHC 32.9 30.0 - 36.0 g/dL  ? RDW 14.9 11.5 - 15.5 %  ? Platelets 373 150 - 400 K/uL  ? nRBC 0.0 0.0 - 0.2 %  ?  ?US OB Comp AddL Gest Less 14 Wks ? ?Result Date: 02/13/2022 ?CLINICAL DATA:  Cramping. LMP: 11/26/2021 corresponding to an estimated gestational age of [redacted] weeks, 2 days. EXAM: TWIN OBSTETRIC <14WK US AND TRANSVAGINAL OB US TECHNIQUE: Both transabdominal and transvaginal ultrasound examinations were performed for complete evaluation of the gestation as well as the maternal uterus, adnexal regions, and pelvic cul-de-sac. Transvaginal technique was performed to assess early pregnancy. COMPARISON:  None Available. FINDINGS: Number of IUPs:  2 Chorionicity/Amnionicity:  Dichorionic-diamniotic (thick membrane) TWIN 1 Yolk sac:  Seen Embryo:  Present Cardiac Activity: Detected Heart Rate: 180 bpm CRL:  41 mm   11 w 0 d                  US EDC: 09/04/2022 TWIN 2 Yolk sac:  Seen Embryo:  Present Cardiac Activity: The detected Heart Rate: 174 bpm CRL:  34 mm   10 w 1 d                  US EDC: 09/10/2022 Subchorionic hemorrhage:  None visualized. Maternal uterus/adnexae: The maternal ovary is unremarkable IMPRESSION: Live twin intrauterine pregnancy as above. Electronically Signed   By: Elgie CollardArash  Radparvar M.D.   On: 02/13/2022 22:21  ? ?US OB LESS THAN 14 WEEKS WITH OB TRANSVAGINAL ? ?Result Date: 02/13/2022 ?CLINICAL DATA:  Cramping. LMP: 11/26/2021 corresponding to an estimated gestational age of [redacted] weeks, 2 days. EXAM: TWIN OBSTETRIC <14WK US AND TRANSVAGINAL OB US TECHNIQUE: Both transabdominal and transvaginal ultrasound examinations were performed for complete evaluation of the gestation as well as the maternal uterus, adnexal regions, and pelvic cul-de-sac. Transvaginal technique was performed to assess early pregnancy. COMPARISON:  None  Available. FINDINGS: Number of IUPs:  2 Chorionicity/Amnionicity:  Dichorionic-diamniotic (thick membrane) TWIN 1 Yolk sac:  Seen Embryo:  Present Cardiac Activity: Detected Heart Rate: 180 bpm CRL:  41 mm

## 2022-02-13 NOTE — MAU Note (Signed)
Cleone Slim CNM in Family RM to discuss test result and plan of care ?

## 2022-02-13 NOTE — MAU Note (Signed)
.  Cynthia Valenzuela is a 26 y.o. at [redacted]w[redacted]d here in MAU reporting abdominal cramping since this am. Denies vaginal bleeding or discharge.  ?LMP: 2/19 ?Onset of complaint: this am ?Pain score: 6 ?Vitals:  ? 02/13/22 1941 02/13/22 1943  ?BP:  138/68  ?Pulse: 85   ?Resp: 17   ?Temp: 98.6 ?F (37 ?C)   ?SpO2: 99%   ?   ?FHT:did not listen in Triage due to pt's habitus ?Lab orders placed from triage:  u/a placed earlier ? ?

## 2022-02-14 ENCOUNTER — Telehealth: Payer: Self-pay | Admitting: Family Medicine

## 2022-02-14 LAB — GC/CHLAMYDIA PROBE AMP (~~LOC~~) NOT AT ARMC
Chlamydia: NEGATIVE
Comment: NEGATIVE
Comment: NORMAL
Neisseria Gonorrhea: NEGATIVE

## 2022-02-14 NOTE — Telephone Encounter (Signed)
Called patient to schedule new ob appointments, there was no answer to the phone call so a voicemail was left with the call back number for the office and a mychart message was sent.  

## 2022-02-21 ENCOUNTER — Encounter: Payer: Self-pay | Admitting: General Practice

## 2022-02-21 ENCOUNTER — Telehealth (INDEPENDENT_AMBULATORY_CARE_PROVIDER_SITE_OTHER): Payer: Medicaid Other

## 2022-02-21 DIAGNOSIS — O09299 Supervision of pregnancy with other poor reproductive or obstetric history, unspecified trimester: Secondary | ICD-10-CM

## 2022-02-21 DIAGNOSIS — O0990 Supervision of high risk pregnancy, unspecified, unspecified trimester: Secondary | ICD-10-CM | POA: Insufficient documentation

## 2022-02-21 MED ORDER — GOJJI WEIGHT SCALE MISC: 1.0000 | 0 refills | Status: DC | PRN

## 2022-02-21 NOTE — Progress Notes (Signed)
New OB Intake  I connected with  Peter Minium on 02/21/22 at 10:15 AM EDT by MyChart Video Visit and verified that I am speaking with the correct person using two identifiers. Nurse is located at [facility] and pt is located at home.  I discussed the limitations, risks, security and privacy concerns of performing an evaluation and management service by telephone and the availability of in person appointments. I also discussed with the patient that there may be a patient responsible charge related to this service. The patient expressed understanding and agreed to proceed.  I explained I am completing New OB Intake today. We discussed her EDD of 09/02/2022 that is based on LMP of 11/26/2021. Pt is G8/P1. I reviewed her allergies, medications, Medical/Surgical/OB history, and appropriate screenings. I informed her of [facility] services. Based on history, this is an  pregnancy complicated by:  Patient Active Problem List   Diagnosis Date Noted   Supervision of high risk pregnancy, antepartum 02/21/2022   Dyschezia 07/17/2021   History of pre-eclampsia 06/02/2021   Gestational diabetes mellitus (GDM), antepartum 01/19/2021   BMI 60.0-69.9, adult ([place]) 12/19/2020   Incompetent cervix 01/25/2016    Concerns addressed today: -Hx of gestational diabetes -Incompetent cervix, had a cerclage at her last pregnancy. Pt is scheduled for a cervical length with MFM on 03/07/2022. Consult with Dr. Elgie Congo.  -Pap is not due, last pap was 11/28/2020.    Delivery Plans:  Plans to deliver at [facility].   MyChart/Babyscripts MyChart access verified. I explained pt will have some visits in office and some virtually. Babyscripts instructions given and order placed. Patient verifies receipt of registration text/e-mail. Account successfully created and app downloaded.  Blood Pressure Cuff  Patient still has her blood pressure cuff from her previous pregnancy.   Weight scale: Patient does not  have weight scale.  Weight scale ordered for patient to pick up from [employer].   Anatomy US Explained first scheduled [place] will be around 19 weeks. Anatomy US scheduled for 04/11/2022 at 10:45am. Pt notified to arrive at 10:30am.   Labs Discussed Natera genetic screening with patient. Would like both Panorama and Horizon drawn at new OB visit.Also if interested in genetic testing, tell patient she will need AFP 15-21 weeks to complete genetic testing .Routine prenatal labs needed.  Covid Vaccine Patient has not covid vaccine.   Is patient a CenteringPregnancy candidate?  Declined Declined due to Childcare  Is patient a Mom+Baby Combined Care candidate?  Not a candidate    Is patient interested in [place]?  No    Informed patient of [facility] website  and placed link in her AVS.   Social Determinants of Health Food Insecurity: Patient denies food insecurity. WIC Referral: Patient is interested in referral to [facility].  Transportation: Patient denies transportation needs. Childcare: Discussed no children allowed at ultrasound appointments. Offered childcare services; patient declines childcare services at this time.  Send link to Pregnancy Navigators   Placed OB Box on problem list and updated  First visit review I reviewed new OB appt with pt. I explained she will have a pelvic exam, ob bloodwork with genetic screening, and PAP smear. Explained pt will be seen by Aletha Halim MD at first visit; encounter routed to appropriate provider. Explained that patient will be seen by pregnancy navigator following visit with provider. [facility] information placed in AVS.   Mariane Baumgarten, CMA 02/21/2022  1:24 PM

## 2022-02-25 ENCOUNTER — Encounter: Payer: Self-pay | Admitting: Obstetrics and Gynecology

## 2022-02-25 DIAGNOSIS — O09891 Supervision of other high risk pregnancies, first trimester: Secondary | ICD-10-CM | POA: Insufficient documentation

## 2022-03-07 ENCOUNTER — Ambulatory Visit: Payer: Medicaid Other | Attending: Family Medicine | Admitting: Maternal & Fetal Medicine

## 2022-03-07 ENCOUNTER — Ambulatory Visit: Payer: Medicaid Other | Admitting: *Deleted

## 2022-03-07 ENCOUNTER — Ambulatory Visit: Payer: Medicaid Other | Attending: Family Medicine

## 2022-03-07 VITALS — BP 138/76 | HR 88

## 2022-03-07 DIAGNOSIS — Z8759 Personal history of other complications of pregnancy, childbirth and the puerperium: Secondary | ICD-10-CM | POA: Diagnosis present

## 2022-03-07 DIAGNOSIS — O0990 Supervision of high risk pregnancy, unspecified, unspecified trimester: Secondary | ICD-10-CM

## 2022-03-07 DIAGNOSIS — O30042 Twin pregnancy, dichorionic/diamniotic, second trimester: Secondary | ICD-10-CM | POA: Diagnosis not present

## 2022-03-07 DIAGNOSIS — O09299 Supervision of pregnancy with other poor reproductive or obstetric history, unspecified trimester: Secondary | ICD-10-CM

## 2022-03-07 DIAGNOSIS — O99210 Obesity complicating pregnancy, unspecified trimester: Secondary | ICD-10-CM | POA: Diagnosis not present

## 2022-03-08 ENCOUNTER — Other Ambulatory Visit: Payer: Self-pay | Admitting: *Deleted

## 2022-03-08 DIAGNOSIS — Z8759 Personal history of other complications of pregnancy, childbirth and the puerperium: Secondary | ICD-10-CM

## 2022-03-08 DIAGNOSIS — O09299 Supervision of pregnancy with other poor reproductive or obstetric history, unspecified trimester: Secondary | ICD-10-CM

## 2022-03-08 NOTE — Progress Notes (Signed)
MFM Brief Note  Ms.  Valenzuela is a 26 yo G8P1 who si 14w3 d with an EDD of 09/02/22 seen today at the reques of Dr. Clayton Lefort.  Limited exam due to prior history of midtrimester loss [name] has a normal cervical length today. We discussed her first pregnancy requiring a rescue cerclage Her second pregnancy she had a history indicated cerclage that resulted in a term pregnancy  This current pregnancy she has diamniotic dichorionic twin pregnancy, however she is concerned about a midtrimester loss and would like consideration for a cerclage.  I reviewed that most data related to cerlcage placement in twin pregnancy is related to rescue cerclage or shortened cervix. There is a paucity of data evaluating a history indicated cerlage placement in twin pregnancies.  There are no randomized controlled trials this subcategory of twin pregnancies. However, a study in the Ultrasound in Obstetrics and Gynecology journal in 2019 titled History-indicated cervical cerclage in management of twin pregnancy reported positive outcomes when a cerclage is placed in the first trimester vs expectant management.   I reviewed with [name] that provider preference is mixed therefore individualized managment plans are recommended.   After review of today's findings she would like to consider a cerclage and discuss with her significant other.   She is scheduled to return in 2 weeks for CL measurement.  Lastly, I discussed this case with Dr. Donalee Citrin who we both agreed to that a cerclage is reasonable for [name].   She is scheduled to return in 2 weeks but will attempt to discuss a cerclage with her in 1 week.  All questions answered   I spent 30 minutes with > 50% in face to face consulation.  Vikki Ports, MD

## 2022-03-09 ENCOUNTER — Encounter: Payer: Self-pay | Admitting: Obstetrics and Gynecology

## 2022-03-09 ENCOUNTER — Ambulatory Visit (INDEPENDENT_AMBULATORY_CARE_PROVIDER_SITE_OTHER): Payer: Medicaid Other | Admitting: Obstetrics and Gynecology

## 2022-03-09 VITALS — BP 119/45 | HR 89 | Wt 366.0 lb

## 2022-03-09 DIAGNOSIS — O30042 Twin pregnancy, dichorionic/diamniotic, second trimester: Secondary | ICD-10-CM | POA: Insufficient documentation

## 2022-03-09 DIAGNOSIS — O0990 Supervision of high risk pregnancy, unspecified, unspecified trimester: Secondary | ICD-10-CM | POA: Diagnosis not present

## 2022-03-09 DIAGNOSIS — N883 Incompetence of cervix uteri: Secondary | ICD-10-CM

## 2022-03-09 DIAGNOSIS — O99210 Obesity complicating pregnancy, unspecified trimester: Secondary | ICD-10-CM

## 2022-03-09 DIAGNOSIS — Z8632 Personal history of gestational diabetes: Secondary | ICD-10-CM

## 2022-03-09 DIAGNOSIS — O09892 Supervision of other high risk pregnancies, second trimester: Secondary | ICD-10-CM

## 2022-03-09 DIAGNOSIS — Z6841 Body Mass Index (BMI) 40.0 and over, adult: Secondary | ICD-10-CM

## 2022-03-09 DIAGNOSIS — Z8759 Personal history of other complications of pregnancy, childbirth and the puerperium: Secondary | ICD-10-CM

## 2022-03-09 DIAGNOSIS — Z3A14 14 weeks gestation of pregnancy: Secondary | ICD-10-CM | POA: Diagnosis not present

## 2022-03-09 MED ORDER — ASPIRIN 81 MG PO TBEC: 162.0000 mg | DELAYED_RELEASE_TABLET | Freq: Every day | ORAL | 1 refills | Status: DC

## 2022-03-11 LAB — CBC/D/PLT+RPR+RH+ABO+RUBIGG...
Antibody Screen: NEGATIVE
Basophils Absolute: 0 10*3/uL (ref 0.0–0.2)
Basos: 0 %
EOS (ABSOLUTE): 0.1 10*3/uL (ref 0.0–0.4)
Eos: 1 %
HCV Ab: NONREACTIVE
HIV Screen 4th Generation wRfx: NONREACTIVE
Hematocrit: 37.1 % (ref 34.0–46.6)
Hemoglobin: 12.1 g/dL (ref 11.1–15.9)
Hepatitis B Surface Ag: NEGATIVE
Immature Grans (Abs): 0 10*3/uL (ref 0.0–0.1)
Immature Granulocytes: 1 %
Lymphocytes Absolute: 1.7 10*3/uL (ref 0.7–3.1)
Lymphs: 20 %
MCH: 27.6 pg (ref 26.6–33.0)
MCHC: 32.6 g/dL (ref 31.5–35.7)
MCV: 85 fL (ref 79–97)
Monocytes Absolute: 0.3 10*3/uL (ref 0.1–0.9)
Monocytes: 4 %
Neutrophils Absolute: 6.4 10*3/uL (ref 1.4–7.0)
Neutrophils: 74 %
Platelets: 375 10*3/uL (ref 150–450)
RBC: 4.38 x10E6/uL (ref 3.77–5.28)
RDW: 14.1 % (ref 11.7–15.4)
RPR Ser Ql: NONREACTIVE
Rh Factor: POSITIVE
Rubella Antibodies, IGG: 2.16 index (ref >0.99)
WBC: 8.5 10*3/uL (ref 3.4–10.8)

## 2022-03-11 LAB — COMPREHENSIVE METABOLIC PANEL
ALT: 23 IU/L (ref 0–32)
AST: 20 IU/L (ref 0–40)
Albumin/Globulin Ratio: 1.4 (ref 1.2–2.2)
Albumin: 3.6 g/dL — ABNORMAL LOW (ref 3.9–5.0)
Alkaline Phosphatase: 55 IU/L (ref 44–121)
BUN/Creatinine Ratio: 18 (ref 9–23)
BUN: 7 mg/dL (ref 6–20)
Bilirubin Total: 0.2 mg/dL (ref 0.0–1.2)
CO2: 19 mmol/L — ABNORMAL LOW (ref 20–29)
Calcium: 9.1 mg/dL (ref 8.7–10.2)
Chloride: 104 mmol/L (ref 96–106)
Creatinine, Ser: 0.38 mg/dL — ABNORMAL LOW (ref 0.57–1.00)
Globulin, Total: 2.5 g/dL (ref 1.5–4.5)
Glucose: 112 mg/dL — ABNORMAL HIGH (ref 70–99)
Potassium: 3.6 mmol/L (ref 3.5–5.2)
Sodium: 136 mmol/L (ref 134–144)
Total Protein: 6.1 g/dL (ref 6.0–8.5)
eGFR: 142 mL/min/{1.73_m2} (ref >59)

## 2022-03-11 LAB — HEMOGLOBIN A1C
Est. average glucose Bld gHb Est-mCnc: 126 mg/dL
Hgb A1c MFr Bld: 6 % — ABNORMAL HIGH (ref 4.8–5.6)

## 2022-03-11 LAB — PROTEIN / CREATININE RATIO, URINE
Creatinine, Urine: 293.7 mg/dL
Protein, Ur: 53.3 mg/dL
Protein/Creat Ratio: 181 mg/g creat (ref 0–200)

## 2022-03-11 LAB — HCV INTERPRETATION

## 2022-03-11 LAB — CULTURE, OB URINE

## 2022-03-11 LAB — URINE CULTURE, OB REFLEX

## 2022-03-11 LAB — TSH RFX ON ABNORMAL TO FREE T4: TSH: 0.864 u[IU]/mL (ref 0.450–4.500)

## 2022-03-12 ENCOUNTER — Telehealth: Payer: Self-pay

## 2022-03-12 ENCOUNTER — Encounter (HOSPITAL_COMMUNITY): Payer: Self-pay | Admitting: *Deleted

## 2022-03-12 ENCOUNTER — Encounter (HOSPITAL_COMMUNITY): Payer: Self-pay | Admitting: Obstetrics and Gynecology

## 2022-03-12 ENCOUNTER — Encounter: Payer: Self-pay | Admitting: Obstetrics and Gynecology

## 2022-03-12 ENCOUNTER — Encounter: Payer: Self-pay | Admitting: *Deleted

## 2022-03-12 ENCOUNTER — Telehealth (HOSPITAL_COMMUNITY): Payer: Self-pay | Admitting: *Deleted

## 2022-03-12 NOTE — Progress Notes (Signed)
New OB Note  03/09/2022   Clinic: [facility]  Chief Complaint: new OB  Transfer of Care Patient: no  History of Present Illness: Cynthia Valenzuela is a 26 y.o. [id number] @ 14/5 weeks (EDC 11/26, based on Patient's last menstrual period was 11/26/2021.=11wk u/s).  Preg complicated by has History of Incompetent cervix; Obesity in pregnancy; BMI 60.0-69.9, adult (HCC); History of gestational diabetes mellitus (GDM); History of severe pre-eclampsia; Supervision of high risk pregnancy, antepartum; Short interval between pregnancies affecting pregnancy in second trimester, antepartum; and Dichorionic diamniotic twin pregnancy in second trimester on their problem list.   Her periods were: regular, qmonth She was using no method when she conceived.  She has Negative signs or symptoms of nausea/vomiting of pregnancy. She has Negative signs or symptoms of miscarriage or preterm labor  ROS: A 12-point review of systems was performed and negative, except as stated in the above HPI.  OBGYN History: As per HPI. OB History  Gravida Para Term Preterm AB Living  SAB IAB Ectopic Multiple Live Births  4 1 0 0 2    # Outcome Date GA Lbr Len/2nd Weight Sex Delivery Anes PTL Lv  8 Current           7 Term 06/02/21 [redacted]w[redacted]d 02:08 / 00:20 4 lb 11.1 oz (2.13 kg) F Vag-Spont None  LIV     Birth Comments: wnl  6 SAB 05/03/20 [redacted]w[redacted]d   U SAB     5 SAB 10/08/18 [redacted]w[redacted]d   U SAB  N   4 SAB 01/2018 [redacted]w[redacted]d         3 Preterm 02/09/16 [redacted]w[redacted]d 01:35 / 00:03 1 lb 6.6 oz (0.64 kg) M Vag-Spont None  DEC     Birth Comments: extreme prematurity     Complications: Cervix incompetence  2 SAB 04/2015 [redacted]w[redacted]d         1 IAB 11/2014            Obstetric Comments  Feb 23, 2016: Preterm child deceased due to loss from incompetent cervix (Had rescue cerclage)   Prior children are healthy, doing well, and without any problems or issues: yes History of pap smears: Yes. Last pap smear 2021/02/22 and results were  wnl   Past Medical History: Past Medical History:  Diagnosis Date   COVID-19 08/2019   Headache    Infection    UTI   Obesities, morbid (HCC)    Preterm labor     Past Surgical History: Past Surgical History:  Procedure Laterality Date   CERVICAL CERCLAGE     CERVICAL CERCLAGE N/A 12/26/2020   Procedure: CERCLAGE CERVICAL;  Surgeon: Catalina Antigua, MD;  Location: [facility] LD [facility];  Service: Gynecology;  Laterality: N/A;   WISDOM TOOTH EXTRACTION      Family History:  Family History  Problem Relation Age of Onset   Diabetes Mother    Alcohol abuse Father    Diabetes Sister    Asthma Neg Hx    Heart disease Neg Hx    Hypertension Neg Hx    Cancer Neg Hx    Social History:  Social History   Socioeconomic History   Marital status: Married    Spouse name: Not on file   Number of children: 0   Years of education: Not on file   Highest education level: Not on file  Occupational History   Not on file  Tobacco Use   Smoking status: Former  Types: Cigarettes    Quit date: 11/21/2018    Years since quitting: 3.3   Smokeless tobacco: Never   Tobacco comments:    vapes  Vaping Use   Vaping Use: Former   Quit date: 11/08/2020   Substances: Nicotine, Flavoring  Substance and Sexual Activity   Alcohol use: Not Currently    Comment: occasionally, weekends   Drug use: Not Currently    Types: Marijuana    Comment: 04/22/2015 quit using marijuana    Sexual activity: Yes    Birth control/protection: None  Other Topics Concern   Not on file  Social History Narrative   ** Merged History Encounter **       Social Determinants of Health   Financial Resource Strain: Not on file  Food Insecurity: No Food Insecurity   Worried About Programme researcher, broadcasting/film/videounning Out of Food in the Last Year: Never true   Ran Out of Food in the Last Year: Never true  Transportation Needs: No Transportation Needs   Lack of Transportation (Medical): No   Lack of Transportation (Non-Medical): No  Physical Activity:  Not on file  Stress: Not on file  Social Connections: Not on file  Intimate Partner Violence: Not on file   Allergy: No Known Allergies  Health Maintenance:  Mammogram Up to Date: not applicable  Current Outpatient Medications: Prenatal vitamins  Physical Exam:   BP (!) 119/45   Pulse 89   Wt (!) 366 lb (166 kg)   LMP 11/26/2021   BMI 60.91 kg/m  Body mass index is 60.91 kg/m. Contractions: Not present Vag. Bleeding: None.  General appearance: Well nourished, well developed female in no acute distress.  Neck:  Supple, normal appearance, and no thyromegaly  Cardiovascular: S1, S2 normal, no murmur, rub or gallop, regular rate and rhythm Respiratory:  Clear to auscultation bilateral. Normal respiratory effort Abdomen: obese, nttp Neuro/Psych:  Normal mood and affect.  Skin:  Warm and dry.   Laboratory: MAU labs reviewed  Imaging:  Narrative & Impression  ----------------------------------------------------------------------  OBSTETRICS REPORT                       (Signed Final 03/08/2022 12:35 pm) ---------------------------------------------------------------------- Patient Info  ID #:       [id number]                          D.O.B.:  06-09-1996 (25 yrs)  Name:       Cynthia Valenzuela               Visit Date: 03/07/2022 04:03 pm ---------------------------------------------------------------------- Performed By  Attending:        Lin Landsmanorenthian Booker      Ref. Address:     [address]                    [address]                                                             [address]  [place], [place]                                                             [address]  Performed By:     Joanna Hews         [facility]  Referred By:      Venora Maples MD ---------------------------------------------------------------------- Orders  #  Description                           Code        Ordered By  1  [place] MFM OB TRANSVAGINAL                [id number]     MATTHEW ECKSTAT  2  [place] MFM OB LIMITED                     76815.01    MATTHEW ECKSTAT ----------------------------------------------------------------------  #  Order #                     Accession #                Episode #  1  [id number]                   [id number]                 [id number]  2  [id number]                   [id number]                 [id number] ---------------------------------------------------------------------- Indications  Weeks of gestation of pregnancy not            Z3A.00  specified  Maternal morbid obesity (Pre-G BMI: 60)        O99.210 E66.01  Poor obstetric history: Previous midtrimester  O09.299  loss  Cervical insufficiency, 2nd  O34.32  LR-NIPS/ Horizon-Neg  Twin pregnancy, di/di, second trimester        O30.042 ---------------------------------------------------------------------- Fetal Evaluation (Fetus A)  Num Of Fetuses:         2  Fetal Heart Rate(bpm):  167  Cardiac Activity:       Observed  Fetal Lie:              Lower Fetus  Presentation:           Transverse, head to maternal left  Placenta:               Posterior  Amniotic Fluid  AFI FV:      Within normal limits ---------------------------------------------------------------------- OB History  Gravidity:    8         Term:   1        Prem:   1        SAB:   5  TOP:          0       Ectopic:  0        Living: 1 ---------------------------------------------------------------------- Fetal Evaluation (Fetus B)  Num Of Fetuses:         2  Fetal Heart Rate(bpm):  158  Cardiac Activity:       Observed  Fetal Lie:              Upper Fetus  Presentation:           Transverse,  head to maternal right  Placenta:               Posterior  Amniotic Fluid  AFI FV:      Within normal limits ---------------------------------------------------------------------- Cervix Uterus Adnexa  Cervix  Length:            3.7  cm.  Normal appearance by transvaginal scan  Uterus  No abnormality visualized.  Right Ovary  Not visualized.  Left Ovary  Not visualized. ---------------------------------------------------------------------- Impression  MFM Brief Note  Ms.  Valenzuela is a 26 yo G8P1 who si 14w3 d with an EDD of  09/02/22 seen today at the reques of Dr. Merian Capron.  Limited exam due to prior history of midtrimester loss  Cynthia Valenzuela has a normal cervical length today.  We discussed her first pregnancy requiring a rescue cerclage  Her second pregnancy she had a history indicated cerclage  that resulted in a term pregnancy  This current pregnancy she has diamniotic dichorionic twin  pregnancy, however she is concerned about a midtrimester  loss and would like consideration for a cerclage.  I reviewed that most data related to cerlcage placement in  twin pregnancy is related to rescue cerclage or shortened  cervix. There is a paucity of data evaluating a history  indicated cerlage placement in twin pregnancies.  There are no randomized controlled trials this subcategory of  twin pregnancies. However, a study in the Ultrasound in  Obstetrics and Gynecology journal in 2019 titled History-  indicated cervical cerclage in management of twin pregnancy  reported positive outcomes when a cerclage is placed in the  first trimester vs expectant management.  I reviewed with Cynthia Valenzuela that provider preference is  mixed therefore individualized managment plans are  recommended.  After review of today's findings she would like to consider a  cerclage and discuss with her significant other.  She is scheduled to return in 2 weeks for CL measurement.  Lastly, I  discussed this case with Dr. Judeth Cornfield  who we both  agreed to that a cerclage is reasonable for Cynthia Valenzuela.  She is scheduled to return in 2 weeks but will attempt to  discuss a cerclage with her in 1 week.  All questions answered  I spent 30 minutes with > 50% in face to face consulation.  Novella Olive, MD ----------------------------------------------------------------------               Lin Landsman, MD Electronically Signed Final Report   03/08/2022 12:35 pm ----------------------------------------------------------------------   Narrative & Impression  CLINICAL DATA:  Cramping. LMP: 11/26/2021 corresponding to an estimated gestational age of [redacted] weeks, 2 days.   EXAM: TWIN OBSTETRIC <14WK [place] AND TRANSVAGINAL OB US   TECHNIQUE: Both transabdominal and transvaginal ultrasound examinations were performed for complete evaluation of the gestation as well as the maternal uterus, adnexal regions, and pelvic cul-de-sac. Transvaginal technique was performed to assess early pregnancy.   COMPARISON:  None Available.   FINDINGS: Number of IUPs:  2   Chorionicity/Amnionicity:  Dichorionic-diamniotic (thick membrane)   TWIN 1   Yolk sac:  Seen   Embryo:  Present   Cardiac Activity: Detected   Heart Rate: 180 bpm   CRL:  41 mm   11 w 0 d                  [place] EDC: 09/04/2022   TWIN 2   Yolk sac:  Seen   Embryo:  Present   Cardiac Activity: The detected   Heart Rate: 174 bpm   CRL:  34 mm   10 w 1 d                  [place] EDC: 09/10/2022   Subchorionic hemorrhage:  None visualized.   Maternal uterus/adnexae: The maternal ovary is unremarkable   IMPRESSION: Live twin intrauterine pregnancy as above.     Electronically Signed   By: Elgie Collard M.D.   On: 02/13/2022 22:21   Assessment: pt doing well  Plan: 1. Supervision of high risk pregnancy, antepartum Follow up labs. Offer afp next visit. Pt amenable to low dose asa; will do  qday, Rx  sent in. Anatomy u/s already scheduled for 7/5 - CBC/D/Plt+RPR+Rh+ABO+RubIgG... - Hemoglobin A1c - Genetic Screening - Culture, OB Urine - Comprehensive metabolic panel - Protein / creatinine ratio, urine - TSH Rfx on Abnormal to Free T4  2. Short interval between pregnancies affecting pregnancy in second trimester, antepartum  3. History of Incompetent cervix Seen by mfm last week (see consult note) but patient had ppx cerclage placed last pregnancy and went to term with no issues. D/w pt and she is amenable to prophylactic cerclage placement. Request sent to scheduling. Pt has 6/15 mfm cx length already scheduled  4. Obesity in pregnancy Goals d/w pt  5. BMI 60.0-69.9, adult (HCC)  6. History of severe pre-eclampsia Baseline labs today  7. History of gestational diabetes mellitus (GDM) F/u a1c  8. Dichorionic diamniotic twin pregnancy in second trimester No current issues.   Problem list reviewed and updated.  Follow up in 3 weeks.   >50% of 35 min visit spent on counseling and coordination of care.     Cornelia Copa MD Attending Center for [facility])

## 2022-03-12 NOTE — Telephone Encounter (Signed)
Called and spoke with patient, surgery date, time, location and preop instructions given to patient. Patient expressed understanding.

## 2022-03-12 NOTE — Telephone Encounter (Signed)
Preadmission screen  

## 2022-03-13 ENCOUNTER — Encounter (HOSPITAL_COMMUNITY)
Admission: RE | Disposition: A | Discharge status: Home / Self Care | Payer: Self-pay | Source: Home / Self Care | Attending: Obstetrics and Gynecology

## 2022-03-13 ENCOUNTER — Other Ambulatory Visit: Payer: Self-pay

## 2022-03-13 ENCOUNTER — Encounter: Payer: Self-pay | Admitting: Obstetrics and Gynecology

## 2022-03-13 ENCOUNTER — Ambulatory Visit (HOSPITAL_COMMUNITY)
Admission: RE | Admit: 2022-03-13 | Discharge: 2022-03-13 | Disposition: A | Discharge status: Home / Self Care | Payer: Medicaid Other | Attending: Obstetrics and Gynecology | Admitting: Obstetrics and Gynecology

## 2022-03-13 ENCOUNTER — Inpatient Hospital Stay (HOSPITAL_BASED_OUTPATIENT_CLINIC_OR_DEPARTMENT_OTHER): Payer: Medicaid Other | Admitting: Anesthesiology

## 2022-03-13 ENCOUNTER — Inpatient Hospital Stay (HOSPITAL_COMMUNITY): Payer: Medicaid Other | Admitting: Anesthesiology

## 2022-03-13 ENCOUNTER — Encounter (HOSPITAL_COMMUNITY): Payer: Self-pay | Admitting: Obstetrics and Gynecology

## 2022-03-13 DIAGNOSIS — Z6841 Body Mass Index (BMI) 40.0 and over, adult: Secondary | ICD-10-CM | POA: Insufficient documentation

## 2022-03-13 DIAGNOSIS — O30042 Twin pregnancy, dichorionic/diamniotic, second trimester: Secondary | ICD-10-CM | POA: Insufficient documentation

## 2022-03-13 DIAGNOSIS — Z3A15 15 weeks gestation of pregnancy: Secondary | ICD-10-CM

## 2022-03-13 DIAGNOSIS — O99810 Abnormal glucose complicating pregnancy: Secondary | ICD-10-CM | POA: Insufficient documentation

## 2022-03-13 DIAGNOSIS — O3432 Maternal care for cervical incompetence, second trimester: Secondary | ICD-10-CM | POA: Diagnosis not present

## 2022-03-13 DIAGNOSIS — Z87891 Personal history of nicotine dependence: Secondary | ICD-10-CM | POA: Diagnosis not present

## 2022-03-13 DIAGNOSIS — O99212 Obesity complicating pregnancy, second trimester: Secondary | ICD-10-CM | POA: Insufficient documentation

## 2022-03-13 DIAGNOSIS — O09292 Supervision of pregnancy with other poor reproductive or obstetric history, second trimester: Secondary | ICD-10-CM | POA: Insufficient documentation

## 2022-03-13 DIAGNOSIS — N883 Incompetence of cervix uteri: Secondary | ICD-10-CM | POA: Diagnosis not present

## 2022-03-13 DIAGNOSIS — Z9889 Other specified postprocedural states: Secondary | ICD-10-CM | POA: Diagnosis present

## 2022-03-13 DIAGNOSIS — Z8616 Personal history of COVID-19: Secondary | ICD-10-CM | POA: Diagnosis not present

## 2022-03-13 HISTORY — PX: CERVICAL CERCLAGE: SHX1329

## 2022-03-13 LAB — TYPE AND SCREEN
ABO/RH(D): A POS
Antibody Screen: NEGATIVE

## 2022-03-13 SURGERY — CERCLAGE, CERVIX, VAGINAL APPROACH
Anesthesia: Choice

## 2022-03-13 MED ORDER — CHLOROPROCAINE HCL (PF) 3 % IJ SOLN
INTRAMUSCULAR | Status: DC | PRN
  Administered 2022-03-13: 1.6 mL via EPIDURAL

## 2022-03-13 MED ORDER — KETOROLAC TROMETHAMINE 30 MG/ML IJ SOLN
INTRAMUSCULAR | Status: AC
  Filled 2022-03-13: qty 1

## 2022-03-13 MED ORDER — KETOROLAC TROMETHAMINE 15 MG/ML IJ SOLN
15.0000 mg | INTRAMUSCULAR | Status: AC
  Administered 2022-03-13: 15 mg via INTRAVENOUS
  Filled 2022-03-13: qty 1

## 2022-03-13 MED ORDER — SODIUM CHLORIDE 0.45 % IV SOLN: INTRAVENOUS | Status: DC

## 2022-03-13 MED ORDER — KETOROLAC TROMETHAMINE 30 MG/ML IJ SOLN
15.0000 mg | Freq: Once | INTRAMUSCULAR | Status: AC
  Administered 2022-03-13: 15 mg via INTRAVENOUS

## 2022-03-13 MED ORDER — FENTANYL CITRATE (PF) 100 MCG/2ML IJ SOLN
INTRAMUSCULAR | Status: AC
  Filled 2022-03-13: qty 2

## 2022-03-13 MED ORDER — IBUPROFEN 600 MG PO TABS: 600.0000 mg | ORAL_TABLET | Freq: Four times a day (QID) | ORAL | 0 refills | Status: DC

## 2022-03-13 MED ORDER — LACTATED RINGERS IV SOLN: INTRAVENOUS | Status: DC

## 2022-03-13 MED ORDER — POVIDONE-IODINE 10 % EX SWAB
2.0000 "application " | Freq: Once | CUTANEOUS | Status: AC
  Administered 2022-03-13: 2 via TOPICAL

## 2022-03-13 MED ORDER — ONDANSETRON HCL 4 MG/2ML IJ SOLN
INTRAMUSCULAR | Status: DC | PRN
  Administered 2022-03-13: 4 mg via INTRAVENOUS

## 2022-03-13 MED ORDER — FENTANYL CITRATE (PF) 100 MCG/2ML IJ SOLN
INTRAMUSCULAR | Status: DC | PRN
Start: 2022-03-13 — End: 2022-03-13
  Administered 2022-03-13: 15 ug via INTRATHECAL

## 2022-03-13 MED ORDER — CHLOROPROCAINE HCL (PF) 3 % IJ SOLN
INTRAMUSCULAR | Status: AC
  Filled 2022-03-13: qty 20

## 2022-03-13 MED ORDER — ONDANSETRON HCL 4 MG/2ML IJ SOLN
INTRAMUSCULAR | Status: AC
  Filled 2022-03-13: qty 2

## 2022-03-13 SURGICAL SUPPLY — 17 items
CANISTER SUCT 3000ML PPV (MISCELLANEOUS) ×2 IMPLANT
GLOVE SURG ORTHO LTX SZ8 (GLOVE) ×2 IMPLANT
GLOVE SURG POLYISO LF SZ8 (GLOVE) ×2 IMPLANT
GOWN STRL REUS W/TWL LRG LVL3 (GOWN DISPOSABLE) ×6 IMPLANT
MAT PREVALON FULL STRYKER (MISCELLANEOUS) ×1 IMPLANT
NDL MAYO CATGUT SZ4 TPR NDL (NEEDLE) ×1 IMPLANT
NEEDLE MAYO CATGUT SZ4 (NEEDLE) ×2 IMPLANT
PACK VAGINAL MINOR WOMEN LF (CUSTOM PROCEDURE TRAY) ×2 IMPLANT
PAD OB MATERNITY 4.3X12.25 (PERSONAL CARE ITEMS) ×2 IMPLANT
PAD PREP 24X48 CUFFED NSTRL (MISCELLANEOUS) ×2 IMPLANT
RETRACTOR TRAXI PANNICULUS (MISCELLANEOUS) IMPLANT
SUT MERSILENE 5MM BP 1 12 (SUTURE) ×2 IMPLANT
TOWEL OR 17X24 6PK STRL BLUE (TOWEL DISPOSABLE) ×4 IMPLANT
TRAXI PANNICULUS RETRACTOR (MISCELLANEOUS) ×1
TRAY FOLEY W/BAG SLVR 14FR (SET/KITS/TRAYS/PACK) ×2 IMPLANT
TUBING NON-CON 1/4 X 20 CONN (TUBING) IMPLANT
YANKAUER SUCT BULB TIP NO VENT (SUCTIONS) IMPLANT

## 2022-03-13 NOTE — H&P (Signed)
OBSTETRIC ADMISSION HISTORY AND PHYSICAL  Kathreen Dileo is a 26 y.o. female [phone] with IUP at [redacted]w[redacted]d by LMP presenting for cerclage placement. She receives her [facility] at [facility]   Dating: By LMP --->  Estimated Date of Delivery: 09/02/22    Prenatal History/Complications: hx of midtrimester loss, di/di twins, morbid obesity with BMI of 60  Past Medical History: Past Medical History:  Diagnosis Date   COVID-19 08/2019   Headache    History of pre-eclampsia in prior pregnancy, currently pregnant    Infection    UTI   Obesities, morbid (HCC)    Preterm labor     Past Surgical History: Past Surgical History:  Procedure Laterality Date   CERVICAL CERCLAGE     CERVICAL CERCLAGE N/A 12/26/2020   Procedure: CERCLAGE CERVICAL;  Surgeon: Catalina Antigua, MD;  Location: [facility] LD ORS;  Service: Gynecology;  Laterality: N/A;   WISDOM TOOTH EXTRACTION      Obstetrical History: OB History     Gravida  8   Para  2   Term  1   Preterm  1   AB  5   Living  1      SAB  4   IAB  1   Ectopic  0   Multiple  0   Live Births  2        Obstetric Comments  01/29/16: Preterm child deceased due to loss from incompetent cervix (Had rescue cerclage)         Social History Social History   Socioeconomic History   Marital status: Married    Spouse name: Not on file   Number of children: 0   Years of education: Not on file   Highest education level: Not on file  Occupational History   Not on file  Tobacco Use   Smoking status: Former    Types: Cigarettes    Quit date: 11/21/2018    Years since quitting: 3.3   Smokeless tobacco: Never   Tobacco comments:    vapes  Vaping Use   Vaping Use: Former   Quit date: 11/08/2020   Substances: Nicotine, Flavoring  Substance and Sexual Activity   Alcohol use: Not Currently    Comment: occasionally, weekends   Drug use: Not Currently    Types: Marijuana    Comment: 04/22/2015 quit using marijuana    Sexual activity: Yes     Birth control/protection: None  Other Topics Concern   Not on file  Social History Narrative   ** Merged History Encounter **       Social Determinants of Health   Financial Resource Strain: Not on file  Food Insecurity: No Food Insecurity   Worried About Programme researcher, broadcasting/film/video in the Last Year: Never true   Ran Out of Food in the Last Year: Never true  Transportation Needs: No Transportation Needs   Lack of Transportation (Medical): No   Lack of Transportation (Non-Medical): No  Physical Activity: Not on file  Stress: Not on file  Social Connections: Not on file    Family History: Family History  Problem Relation Age of Onset   Diabetes Mother    Alcohol abuse Father    Diabetes Sister    Asthma Neg Hx    Heart disease Neg Hx    Hypertension Neg Hx    Cancer Neg Hx     Allergies: No Known Allergies  Facility-Administered Medications Prior to Admission  Medication Dose Route Frequency Provider Last Rate Last Admin  hydrocortisone-pramoxine (PROCTOFOAM-HC) rectal foam 1 applicator  1 applicator Rectal BID Warden Fillers, MD       Medications Prior to Admission  Medication Sig Dispense Refill Last Dose   acetaminophen (TYLENOL) 500 MG tablet Take 500 mg by mouth every 6 (six) hours as needed for moderate pain.   03/13/2022   Prenatal Vit-Fe Fumarate-FA (PRENATAL MULTIVITAMIN) TABS tablet Take 1 tablet by mouth daily at 12 noon. 90 tablet 1 03/13/2022 at 1000   aspirin EC 81 MG tablet Take 2 tablets (162 mg total) by mouth daily. Swallow whole. (Patient taking differently: Take 81 mg by mouth 2 (two) times daily. Swallow whole.) 120 tablet 1 Unknown   Misc. Devices (GOJJI WEIGHT SCALE) MISC 1 Device by Does not apply route as needed. 1 each 0      Review of Systems   All systems reviewed and negative except as stated in HPI  Blood pressure 118/73, pulse 81, temperature 97.9 F (36.6 C), temperature source Oral, height 5\' 5"  (1.651 m), weight (!) 166 kg, last  menstrual period 11/26/2021, SpO2 100 %, not currently breastfeeding. General appearance: alert, cooperative, and morbidly obese Lungs: clear to auscultation bilaterally Heart: regular rate and rhythm Abdomen: soft, non-tender; bowel sounds normal Pelvic: deferred Extremities: Homans sign is negative, no sign of DVT Fetal monitoring : due to obesity bedside ultrasound was performed, fetal movement x 2 was verified Uterine activityNone     Prenatal labs: ABO, Rh: --/--/A POS (06/06 1416) Antibody: NEG (06/06 1416) Rubella: 2.16 (06/02 1135) RPR: Non Reactive (06/02 1135)  HBsAg: Negative (06/02 1135)  HIV: Non Reactive (06/02 1135)  GBS: Positive/-- (08/17 1123)   Prenatal Transfer Tool  Maternal Diabetes: No Genetic Screening: pending Maternal Ultrasounds/Referrals: Other: Fetal Ultrasounds or other Referrals:  Referred to Materal Fetal Medicine  Maternal Substance Abuse:  No Significant Maternal Medications:  None Significant Maternal Lab Results: Other: A1c:  6  Results for orders placed or performed during the hospital encounter of 03/13/22 (from the past 24 hour(s))  Type and screen [facility]   Collection Time: 03/13/22  2:16 PM  Result Value Ref Range   ABO/RH(D) A POS    Antibody Screen NEG    Sample Expiration      03/16/2022,2359 Performed at [facility], [address] [place], [place]     Patient Active Problem List   Diagnosis Date Noted   Short interval between pregnancies affecting pregnancy in second trimester, antepartum 03/09/2022   Dichorionic diamniotic twin pregnancy in second trimester 03/09/2022   Supervision of high risk pregnancy, antepartum 02/21/2022   History of severe pre-eclampsia 06/02/2021   History of gestational diabetes mellitus (GDM) 01/19/2021   BMI 60.0-69.9, adult (HCC) 12/19/2020   Obesity in pregnancy 11/21/2020   History of Incompetent cervix 01/25/2016    Assessment/Plan:  Dayton Sherr is a 26 y.o. 30 at [redacted]w[redacted]d here for cervical cerclage  Pt has been given risks and benefits of the procedure including bleeding, infection, involvement of other organs, inadvertent ROM and fetal loss.  Pt wishes to proceed.  Consent is signed  [redacted]w[redacted]d, MD  03/13/2022, 3:14 PM

## 2022-03-13 NOTE — Progress Notes (Signed)
Pt scanned in PACU with bedside ultrasound.  Two viable IUP noted with some difficulty on scan.  Pt is comfortable and denies any vaginal bleeding, abdominal pain or loss of fluid.   Mariel Aloe, MD

## 2022-03-13 NOTE — Anesthesia Procedure Notes (Signed)
Spinal  Patient location during procedure: OB Start time: 03/13/2022 4:30 PM End time: 03/13/2022 4:35 PM Reason for block: surgical anesthesia Staffing Performed: anesthesiologist  Anesthesiologist: Lynda Rainwater, MD Preanesthetic Checklist Completed: patient identified, IV checked, risks and benefits discussed, surgical consent, monitors and equipment checked, pre-op evaluation and timeout performed Spinal Block Patient position: sitting Prep: DuraPrep and site prepped and draped Patient monitoring: heart rate, cardiac monitor, continuous pulse ox and blood pressure Approach: midline Location: L3-4 Injection technique: single-shot Needle Needle type: Quincke  Needle gauge: 22 G Needle length: 12.7 cm Assessment Sensory level: T4 Events: CSF return Additional Notes 1st attempt with 24g Pencan with insufficient length to get into intrathecal space.  Successful with 12.7cm Quincke.

## 2022-03-13 NOTE — Op Note (Signed)
Cynthia Valenzuela   PROCEDURE DATE: 03/13/2022  PREOPERATIVE DIAGNOSIS: Intrauterine pregnancy at [redacted]w[redacted]d, history of cervical incompetence, di/di twins   POSTOPERATIVE DIAGNOSIS: The same PROCEDURE: Transvaginal McDonald Cervical Cerclage Placement SURGEON:  Dr. Mariel Aloe Assistant: Candelaria Celeste, MD  INDICATIONS: 26 y.o. [phone] at [redacted]w[redacted]d with history of cervical incompetence, here for cerclage placement.   The risks of surgery were discussed in detail with the patient including but not limited to: bleeding; infection which may require antibiotic therapy; injury to cervix, vagina other surrounding organs; risk of ruptured membranes and/or preterm delivery and other postoperative or anesthesia complications.  Written informed consent was obtained.    FINDINGS:  About 2.7 cm palpable cervical length in the vagina, closed cervix, suture knot placed anteriorly.  ANESTHESIA:  Spinal INTRAVENOUS FLUIDS: 500  ml ESTIMATED BLOOD LOSS: 5 ml UOP: 185 ml COMPLICATIONS: None immediate   An experienced assistant was required given the standard of surgical care given the complexity of the case.  This assistant was needed for exposure, dissection, suctioning, retraction, instrument exchange, and for overall help during the procedure.  PROCEDURE IN DETAIL:  The patient  had sequential compression devices applied to her lower extremities while in the preoperative area.  Reassuring fetal heart rate was also obtained using a doppler. She was then taken to the operating room where spinal anesthesia was administered and was found to be adequate.  She was placed in the dorsal lithotomy, and was prepped and draped in a sterile manner. Her bladder was catheterized for 185 ml amount of clear, yellow urine.  After an adequate timeout was performed, a vaginal speculum was then placed in the patient's vagina.  The anterior and posterior lips of the cervix were grasped with ring forceps. A curved needle with a 1 Prolene  suture was inserted at 12 o'clock, as high as possible at the junction of the rugated vaginal epithelium and the smooth cervix, at least 2 cm above the external os.  Four bites are taken circumferentially around the entire cervix in a purse-string fashion, each bite should be deep enough to extend at least midway into the cervical stroma, but not into the endocervical canal. The two ends of the suture were then tied securely anteriorly and cut, leaving the ends long enough to grasp with a clamp when it is time to remove it. There was minimal bleeding noted and the ring forceps were removed with good hemostasis noted.  No immediate complications noted.  All instruments were removed from the patient's vagina.  Ipt had received 15 mg of toradol prior to cast start and received another 15 mg of toradol at the conclusion of the case. Instrument, needle and sponge counts were correct x 2. The patient tolerated the procedure well, and was taken to the recovery area awake and in stable condition.  Reassuring fetal heart rate was also obtained using bedside ultrasound in the recovery area.  The patient will be discharged to home as per PACU criteria.  Routine postoperative instructions given.  She was prescribed ibuprofen 600 mg q 6 hours for 48 hours. She will follow up in the clinic on 03/22/22  for cervical length and ongoing prenatal care.  Mariel Aloe, MD, FACOG Obstetrician & Gynecologist, [facility] for [facility], [facility]

## 2022-03-13 NOTE — Transfer of Care (Signed)
Immediate Anesthesia Transfer of Care Note  Patient: Cynthia Valenzuela  Procedure(s) Performed: CERCLAGE CERVICAL  Patient Location: PACU  Anesthesia Type:Spinal  Level of Consciousness: awake, alert  and oriented  Airway & Oxygen Therapy: Patient Spontanous Breathing  Post-op Assessment: Report given to RN and Post -op Vital signs reviewed and stable  Post vital signs: Reviewed and stable  Last Vitals:  Vitals Value Taken Time  BP    Temp 36.6 C 03/13/22 1728  Pulse 75 03/13/22 1727  Resp 15 03/13/22 1727  SpO2 100 % 03/13/22 1727  Vitals shown include unvalidated device data.  Last Pain:  Vitals:   03/13/22 1728  TempSrc: Oral         Complications: No notable events documented.

## 2022-03-14 ENCOUNTER — Encounter (HOSPITAL_COMMUNITY): Payer: Self-pay | Admitting: Obstetrics and Gynecology

## 2022-03-14 NOTE — Anesthesia Preprocedure Evaluation (Signed)
Anesthesia Evaluation  Patient identified by MRN, date of birth, ID band Patient awake    Reviewed: Allergy & Precautions, NPO status , Patient's Chart, lab work & pertinent test results  History of Anesthesia Complications Negative for: history of anesthetic complications  Airway Mallampati: III  TM Distance: >3 FB Neck ROM: Full    Dental no notable dental hx. (+) Dental Advisory Given   Pulmonary neg pulmonary ROS, former smoker,    Pulmonary exam normal        Cardiovascular negative cardio ROS Normal cardiovascular exam     Neuro/Psych negative neurological ROS  negative psych ROS   GI/Hepatic negative GI ROS, Neg liver ROS,   Endo/Other  negative endocrine ROS  Renal/GU negative Renal ROS  negative genitourinary   Musculoskeletal negative musculoskeletal ROS (+)   Abdominal   Peds negative pediatric ROS (+)  Hematology negative hematology ROS (+)   Anesthesia Other Findings   Reproductive/Obstetrics negative OB ROS                             Anesthesia Physical  Anesthesia Plan  ASA: III  Anesthesia Plan: Spinal   Post-op Pain Management:    Induction:   PONV Risk Score and Plan: 2 and Ondansetron and Treatment may vary due to age or medical condition  Airway Management Planned:   Additional Equipment:   Intra-op Plan:   Post-operative Plan:   Informed Consent: I have reviewed the patients History and Physical, chart, labs and discussed the procedure including the risks, benefits and alternatives for the proposed anesthesia with the patient or authorized representative who has indicated his/her understanding and acceptance.     Dental advisory given  Plan Discussed with: CRNA and Anesthesiologist  Anesthesia Plan Comments:         Anesthesia Quick Evaluation

## 2022-03-14 NOTE — Anesthesia Postprocedure Evaluation (Signed)
Anesthesia Post Note  Patient: Cynthia Valenzuela  Procedure(s) Performed: CERCLAGE CERVICAL     Patient location during evaluation: PACU Anesthesia Type: General Level of consciousness: awake and alert Pain management: pain level controlled Vital Signs Assessment: post-procedure vital signs reviewed and stable Respiratory status: spontaneous breathing, nonlabored ventilation and respiratory function stable Cardiovascular status: blood pressure returned to baseline and stable Postop Assessment: no apparent nausea or vomiting Anesthetic complications: no   No notable events documented.  Last Vitals:  Vitals:   03/13/22 1800 03/13/22 1815  BP: 115/73 122/78  Pulse: 84   Resp: (!) 34 16  Temp: 36.6 C   SpO2: 100%     Last Pain:  Vitals:   03/13/22 1800  TempSrc: Oral                 Lowella Curb

## 2022-03-19 ENCOUNTER — Other Ambulatory Visit: Payer: Self-pay | Admitting: *Deleted

## 2022-03-19 DIAGNOSIS — O0990 Supervision of high risk pregnancy, unspecified, unspecified trimester: Secondary | ICD-10-CM

## 2022-03-22 ENCOUNTER — Other Ambulatory Visit: Payer: Self-pay | Admitting: Maternal & Fetal Medicine

## 2022-03-22 ENCOUNTER — Ambulatory Visit: Payer: Medicaid Other | Attending: Obstetrics and Gynecology

## 2022-03-22 ENCOUNTER — Other Ambulatory Visit: Payer: Medicaid Other

## 2022-03-22 ENCOUNTER — Ambulatory Visit: Payer: Medicaid Other | Admitting: *Deleted

## 2022-03-22 VITALS — BP 122/66 | HR 89

## 2022-03-22 DIAGNOSIS — Z8759 Personal history of other complications of pregnancy, childbirth and the puerperium: Secondary | ICD-10-CM

## 2022-03-22 DIAGNOSIS — O0990 Supervision of high risk pregnancy, unspecified, unspecified trimester: Secondary | ICD-10-CM | POA: Insufficient documentation

## 2022-03-22 DIAGNOSIS — Z9889 Other specified postprocedural states: Secondary | ICD-10-CM | POA: Insufficient documentation

## 2022-03-22 DIAGNOSIS — Z3A16 16 weeks gestation of pregnancy: Secondary | ICD-10-CM

## 2022-03-22 DIAGNOSIS — O09299 Supervision of pregnancy with other poor reproductive or obstetric history, unspecified trimester: Secondary | ICD-10-CM

## 2022-03-22 DIAGNOSIS — O3432 Maternal care for cervical incompetence, second trimester: Secondary | ICD-10-CM | POA: Diagnosis not present

## 2022-03-22 DIAGNOSIS — O09292 Supervision of pregnancy with other poor reproductive or obstetric history, second trimester: Secondary | ICD-10-CM | POA: Diagnosis not present

## 2022-03-22 DIAGNOSIS — O30042 Twin pregnancy, dichorionic/diamniotic, second trimester: Secondary | ICD-10-CM

## 2022-03-22 DIAGNOSIS — O99212 Obesity complicating pregnancy, second trimester: Secondary | ICD-10-CM

## 2022-03-27 ENCOUNTER — Encounter: Payer: Medicaid Other | Admitting: Family Medicine

## 2022-03-28 ENCOUNTER — Encounter: Payer: Self-pay | Admitting: Obstetrics and Gynecology

## 2022-03-28 ENCOUNTER — Telehealth: Payer: Self-pay

## 2022-03-28 DIAGNOSIS — O285 Abnormal chromosomal and genetic finding on antenatal screening of mother: Secondary | ICD-10-CM | POA: Insufficient documentation

## 2022-03-28 NOTE — Telephone Encounter (Signed)
Called pt to review Panorama results that show insufficient fetal DNA. Offered redraw. Pt will come tomorrow for redraw and AFP. Vergie Living, MD notified of results.

## 2022-03-29 ENCOUNTER — Other Ambulatory Visit: Payer: Self-pay

## 2022-03-29 ENCOUNTER — Other Ambulatory Visit: Payer: Medicaid Other

## 2022-03-29 DIAGNOSIS — O0990 Supervision of high risk pregnancy, unspecified, unspecified trimester: Secondary | ICD-10-CM

## 2022-03-30 ENCOUNTER — Encounter: Payer: Medicaid Other | Admitting: Obstetrics & Gynecology

## 2022-04-03 LAB — AFP, SERUM, OPEN SPINA BIFIDA
AFP MoM: 1.39
AFP Value: 37.1 ng/mL
Gest. Age on Collection Date: 17.6 weeks
Maternal Age At EDD: 27.3 yr
OSBR Risk 1 IN: 7638
Test Results:: NEGATIVE
Weight: 365 [lb_av]

## 2022-04-05 ENCOUNTER — Ambulatory Visit (INDEPENDENT_AMBULATORY_CARE_PROVIDER_SITE_OTHER): Payer: Medicaid Other | Admitting: Obstetrics and Gynecology

## 2022-04-05 ENCOUNTER — Other Ambulatory Visit: Payer: Self-pay

## 2022-04-05 VITALS — BP 121/74 | HR 94 | Wt 366.1 lb

## 2022-04-05 DIAGNOSIS — Z3A18 18 weeks gestation of pregnancy: Secondary | ICD-10-CM

## 2022-04-05 DIAGNOSIS — O99210 Obesity complicating pregnancy, unspecified trimester: Secondary | ICD-10-CM

## 2022-04-05 DIAGNOSIS — N898 Other specified noninflammatory disorders of vagina: Secondary | ICD-10-CM | POA: Diagnosis not present

## 2022-04-05 DIAGNOSIS — Z6841 Body Mass Index (BMI) 40.0 and over, adult: Secondary | ICD-10-CM

## 2022-04-05 DIAGNOSIS — Z124 Encounter for screening for malignant neoplasm of cervix: Secondary | ICD-10-CM | POA: Diagnosis not present

## 2022-04-05 DIAGNOSIS — R87610 Atypical squamous cells of undetermined significance on cytologic smear of cervix (ASC-US): Secondary | ICD-10-CM

## 2022-04-05 DIAGNOSIS — O3430 Maternal care for cervical incompetence, unspecified trimester: Secondary | ICD-10-CM | POA: Diagnosis not present

## 2022-04-05 DIAGNOSIS — Z8759 Personal history of other complications of pregnancy, childbirth and the puerperium: Secondary | ICD-10-CM

## 2022-04-05 DIAGNOSIS — O30042 Twin pregnancy, dichorionic/diamniotic, second trimester: Secondary | ICD-10-CM

## 2022-04-05 DIAGNOSIS — N883 Incompetence of cervix uteri: Secondary | ICD-10-CM

## 2022-04-05 DIAGNOSIS — O285 Abnormal chromosomal and genetic finding on antenatal screening of mother: Secondary | ICD-10-CM | POA: Diagnosis not present

## 2022-04-05 DIAGNOSIS — O99810 Abnormal glucose complicating pregnancy: Secondary | ICD-10-CM

## 2022-04-05 DIAGNOSIS — A6000 Herpesviral infection of urogenital system, unspecified: Secondary | ICD-10-CM | POA: Diagnosis not present

## 2022-04-05 DIAGNOSIS — O09892 Supervision of other high risk pregnancies, second trimester: Secondary | ICD-10-CM

## 2022-04-05 DIAGNOSIS — Z9889 Other specified postprocedural states: Secondary | ICD-10-CM | POA: Diagnosis not present

## 2022-04-05 NOTE — Progress Notes (Signed)
   PRENATAL VISIT NOTE  Subjective:  Cynthia Valenzuela is a 26 y.o. [phone] at [redacted]w[redacted]d being seen today for ongoing prenatal care.  She is currently monitored for the following issues for this high-risk pregnancy and has History of Incompetent cervix; Obesity in pregnancy; BMI 60.0-69.9, adult (HCC); History of gestational diabetes mellitus (GDM); Cervical cerclage suture present, antepartum; History of severe pre-eclampsia; Supervision of high risk pregnancy, antepartum; Short interval between pregnancies affecting pregnancy in second trimester, antepartum; Dichorionic diamniotic twin pregnancy in second trimester; History of cervical cerclage; Abnormal glucose affecting pregnancy; and Abnormal genetic test during pregnancy on their problem list.  Patient reports  low back pain due to a fall but no OB s/s and regularly scheduled surveillance mfm u/s wnl .  Contractions: Not present. Vag. Bleeding: None.  Movement: Absent. Denies leaking of fluid.   The following portions of the patient's history were reviewed and updated as appropriate: allergies, current medications, past family history, past medical history, past social history, past surgical history and problem list.   Objective:   Vitals:   04/05/22 1620  BP: 121/74  Pulse: 94  Weight: (!) 366 lb 1.6 oz (166.1 kg)    Fetal Status: Fetal Heart Rate (bpm): 130s/150s   Movement: Absent     General:  Alert, oriented and cooperative. Patient is in no acute distress.  Skin: Skin is warm and dry. No rash noted.   Cardiovascular: Normal heart rate noted  Respiratory: Normal respiratory effort, no problems with respiration noted  Abdomen: Soft, gravid, appropriate for gestational age.  Pain/Pressure: Absent     Pelvic: Cervical exam deferred        Extremities: Normal range of motion.  Edema: None  Mental Status: Normal mood and affect. Normal behavior. Normal judgment and thought content.   Assessment and Plan:  Pregnancy: [id number] at  [redacted]w[redacted]d 1. [redacted] weeks gestation of pregnancy Afp neg F/u July anatomy u/s  2. Abnormal genetic test during pregnancy Panorama re-draw from 6/22 still pending  3. History of cervical cerclage  4. Cervical cerclage suture present, antepartum Ppx cerclage placed at 14wks and no issues  5. Abnormal glucose affecting pregnancy Pt knows needs to schedule early GTT  6. Dichorionic diamniotic twin pregnancy in second trimester No issues. +FM x 2 on bedside u/s today  7. Short interval between pregnancies affecting pregnancy in second trimester, antepartum  8. History of severe pre-eclampsia Continue on asa 162 qday  9. BMI 60.0-69.9, adult (HCC) Weight stable  10. Obesity in pregnancy  11. History of Incompetent cervix  Preterm labor symptoms and general obstetric precautions including but not limited to vaginal bleeding, contractions, leaking of fluid and fetal movement were reviewed in detail with the patient. Please refer to After Visit Summary for other counseling recommendations.   No follow-ups on file.  Future Appointments  Date Time Provider Department Center  04/25/2022 12:30 PM [facility] NURSE [facility]  04/25/2022 12:45 PM [facility]  05/03/2022  2:35 PM Warden Fillers, MD [facility]    Waterman Bing, MD

## 2022-04-08 LAB — PANORAMA PRENATAL TEST FULL PANEL:PANORAMA TEST PLUS 5 ADDITIONAL MICRODELETIONS
FETAL FRACTION SECOND FETUS: 3.1
FETAL FRACTION: 3.6

## 2022-04-09 ENCOUNTER — Encounter: Payer: Self-pay | Admitting: Obstetrics and Gynecology

## 2022-04-11 ENCOUNTER — Ambulatory Visit: Payer: Medicaid Other

## 2022-04-13 ENCOUNTER — Other Ambulatory Visit: Payer: Medicaid Other

## 2022-04-13 ENCOUNTER — Other Ambulatory Visit: Payer: Self-pay

## 2022-04-13 DIAGNOSIS — O0990 Supervision of high risk pregnancy, unspecified, unspecified trimester: Secondary | ICD-10-CM

## 2022-04-14 LAB — GLUCOSE TOLERANCE, 1 HOUR: Glucose, 1Hr PP: 167 mg/dL (ref 70–199)

## 2022-04-16 ENCOUNTER — Telehealth: Payer: Self-pay | Admitting: *Deleted

## 2022-04-16 DIAGNOSIS — O99210 Obesity complicating pregnancy, unspecified trimester: Secondary | ICD-10-CM

## 2022-04-16 DIAGNOSIS — R7309 Other abnormal glucose: Secondary | ICD-10-CM

## 2022-04-16 DIAGNOSIS — O285 Abnormal chromosomal and genetic finding on antenatal screening of mother: Secondary | ICD-10-CM

## 2022-04-16 DIAGNOSIS — O0990 Supervision of high risk pregnancy, unspecified, unspecified trimester: Secondary | ICD-10-CM

## 2022-04-16 NOTE — Telephone Encounter (Signed)
I called Cynthia Valenzuela and informed her of results and recommendation for early 2 hr gtt. She agreed to appointment for 04/20/22 . I reviewed instructions for fasting with her. She voices understanding. Nancy Fetter

## 2022-04-16 NOTE — Telephone Encounter (Signed)
-----   Message from [place]gewood Bing, MD sent at 04/16/2022 10:04 AM EDT ----- Needs early fasting 2 hour GTT. thanks

## 2022-04-20 ENCOUNTER — Other Ambulatory Visit: Payer: Medicaid Other

## 2022-04-20 ENCOUNTER — Other Ambulatory Visit: Payer: Self-pay

## 2022-04-20 DIAGNOSIS — R7309 Other abnormal glucose: Secondary | ICD-10-CM

## 2022-04-20 DIAGNOSIS — O99210 Obesity complicating pregnancy, unspecified trimester: Secondary | ICD-10-CM

## 2022-04-20 DIAGNOSIS — O0990 Supervision of high risk pregnancy, unspecified, unspecified trimester: Secondary | ICD-10-CM

## 2022-04-21 LAB — GLUCOSE TOLERANCE, 2 HOURS W/ 1HR
Glucose, 1 hour: 163 mg/dL (ref 70–179)
Glucose, 2 hour: 106 mg/dL (ref 70–152)
Glucose, Fasting: 82 mg/dL (ref 70–91)

## 2022-04-25 ENCOUNTER — Other Ambulatory Visit: Payer: Self-pay | Admitting: Family Medicine

## 2022-04-25 ENCOUNTER — Ambulatory Visit (HOSPITAL_BASED_OUTPATIENT_CLINIC_OR_DEPARTMENT_OTHER): Payer: Medicaid Other

## 2022-04-25 ENCOUNTER — Other Ambulatory Visit: Payer: Self-pay | Admitting: *Deleted

## 2022-04-25 ENCOUNTER — Encounter (HOSPITAL_COMMUNITY): Payer: Self-pay | Admitting: Obstetrics and Gynecology

## 2022-04-25 ENCOUNTER — Inpatient Hospital Stay (HOSPITAL_COMMUNITY)
Admission: AD | Admit: 2022-04-25 | Discharge: 2022-04-28 | DRG: 768 | Disposition: A | Discharge status: Home / Self Care | Payer: Medicaid Other | Attending: Obstetrics and Gynecology | Admitting: Obstetrics and Gynecology

## 2022-04-25 ENCOUNTER — Other Ambulatory Visit: Payer: Self-pay

## 2022-04-25 ENCOUNTER — Inpatient Hospital Stay (HOSPITAL_BASED_OUTPATIENT_CLINIC_OR_DEPARTMENT_OTHER): Payer: Medicaid Other | Admitting: Maternal & Fetal Medicine

## 2022-04-25 ENCOUNTER — Ambulatory Visit: Payer: Medicaid Other | Attending: Family Medicine | Admitting: *Deleted

## 2022-04-25 VITALS — BP 130/77 | HR 92

## 2022-04-25 DIAGNOSIS — O30042 Twin pregnancy, dichorionic/diamniotic, second trimester: Secondary | ICD-10-CM

## 2022-04-25 DIAGNOSIS — O3433 Maternal care for cervical incompetence, third trimester: Secondary | ICD-10-CM | POA: Diagnosis not present

## 2022-04-25 DIAGNOSIS — Z6841 Body Mass Index (BMI) 40.0 and over, adult: Secondary | ICD-10-CM

## 2022-04-25 DIAGNOSIS — O321XX0 Maternal care for breech presentation, not applicable or unspecified: Secondary | ICD-10-CM | POA: Diagnosis not present

## 2022-04-25 DIAGNOSIS — N883 Incompetence of cervix uteri: Secondary | ICD-10-CM | POA: Diagnosis not present

## 2022-04-25 DIAGNOSIS — Z3A21 21 weeks gestation of pregnancy: Secondary | ICD-10-CM

## 2022-04-25 DIAGNOSIS — Z7982 Long term (current) use of aspirin: Secondary | ICD-10-CM

## 2022-04-25 DIAGNOSIS — O0990 Supervision of high risk pregnancy, unspecified, unspecified trimester: Secondary | ICD-10-CM

## 2022-04-25 DIAGNOSIS — O99213 Obesity complicating pregnancy, third trimester: Secondary | ICD-10-CM | POA: Diagnosis not present

## 2022-04-25 DIAGNOSIS — Z8616 Personal history of COVID-19: Secondary | ICD-10-CM

## 2022-04-25 DIAGNOSIS — O09292 Supervision of pregnancy with other poor reproductive or obstetric history, second trimester: Secondary | ICD-10-CM | POA: Insufficient documentation

## 2022-04-25 DIAGNOSIS — O3432 Maternal care for cervical incompetence, second trimester: Secondary | ICD-10-CM

## 2022-04-25 DIAGNOSIS — O99212 Obesity complicating pregnancy, second trimester: Secondary | ICD-10-CM

## 2022-04-25 DIAGNOSIS — O30043 Twin pregnancy, dichorionic/diamniotic, third trimester: Secondary | ICD-10-CM | POA: Diagnosis not present

## 2022-04-25 DIAGNOSIS — Z8759 Personal history of other complications of pregnancy, childbirth and the puerperium: Secondary | ICD-10-CM

## 2022-04-25 DIAGNOSIS — Z87891 Personal history of nicotine dependence: Secondary | ICD-10-CM

## 2022-04-25 DIAGNOSIS — O09892 Supervision of other high risk pregnancies, second trimester: Secondary | ICD-10-CM

## 2022-04-25 HISTORY — DX: Gestational diabetes mellitus in pregnancy, unspecified control: O24.419

## 2022-04-25 LAB — COMPREHENSIVE METABOLIC PANEL
ALT: 24 U/L (ref 0–44)
AST: 16 U/L (ref 15–41)
Albumin: 2.9 g/dL — ABNORMAL LOW (ref 3.5–5.0)
Alkaline Phosphatase: 62 U/L (ref 38–126)
Anion gap: 9 (ref 5–15)
BUN: <5 mg/dL — ABNORMAL LOW (ref 6–20)
CO2: 22 mmol/L (ref 22–32)
Calcium: 9.2 mg/dL (ref 8.9–10.3)
Chloride: 107 mmol/L (ref 98–111)
Creatinine, Ser: 0.42 mg/dL — ABNORMAL LOW (ref 0.44–1.00)
GFR, Estimated: >60 mL/min (ref >60)
Glucose, Bld: 89 mg/dL (ref 70–99)
Potassium: 3.6 mmol/L (ref 3.5–5.1)
Sodium: 138 mmol/L (ref 135–145)
Total Bilirubin: 0.4 mg/dL (ref 0.3–1.2)
Total Protein: 6.7 g/dL (ref 6.5–8.1)

## 2022-04-25 LAB — CBC
HCT: 36 % (ref 36.0–46.0)
Hemoglobin: 12 g/dL (ref 12.0–15.0)
MCH: 28.2 pg (ref 26.0–34.0)
MCHC: 33.3 g/dL (ref 30.0–36.0)
MCV: 84.7 fL (ref 80.0–100.0)
Platelets: 368 10*3/uL (ref 150–400)
RBC: 4.25 MIL/uL (ref 3.87–5.11)
RDW: 15.1 % (ref 11.5–15.5)
WBC: 8.3 10*3/uL (ref 4.0–10.5)
nRBC: 0 % (ref 0.0–0.2)

## 2022-04-25 LAB — TYPE AND SCREEN
ABO/RH(D): A POS
Antibody Screen: NEGATIVE

## 2022-04-25 LAB — PROTEIN / CREATININE RATIO, URINE
Creatinine, Urine: 37 mg/dL
Total Protein, Urine: <6 mg/dL

## 2022-04-25 MED ORDER — CALCIUM CARBONATE ANTACID 500 MG PO CHEW
2.0000 | CHEWABLE_TABLET | ORAL | Status: DC | PRN
Start: 2022-04-25 — End: 2022-04-27

## 2022-04-25 MED ORDER — SODIUM CHLORIDE 0.9 % IV SOLN
3.0000 g | Freq: Four times a day (QID) | INTRAVENOUS | Status: DC
  Administered 2022-04-25 – 2022-04-27 (×7): 3 g via INTRAVENOUS
  Filled 2022-04-25 (×9): qty 8

## 2022-04-25 MED ORDER — ACETAMINOPHEN 325 MG PO TABS
650.0000 mg | ORAL_TABLET | ORAL | Status: DC | PRN
  Administered 2022-04-25 – 2022-04-26 (×3): 650 mg via ORAL
  Filled 2022-04-25 (×3): qty 2

## 2022-04-25 MED ORDER — PRENATAL MULTIVITAMIN CH: 1.0000 | ORAL_TABLET | Freq: Every day | ORAL | Status: DC

## 2022-04-25 MED ORDER — PROGESTERONE 200 MG PO CAPS
200.0000 mg | ORAL_CAPSULE | Freq: Every day | ORAL | Status: DC
  Administered 2022-04-25 – 2022-04-26 (×2): 200 mg via VAGINAL
  Filled 2022-04-25 (×3): qty 1

## 2022-04-25 MED ORDER — LACTATED RINGERS IV SOLN: INTRAVENOUS | Status: DC

## 2022-04-25 MED ORDER — PRENATAL MULTIVITAMIN CH
1.0000 | ORAL_TABLET | Freq: Every day | ORAL | Status: DC
  Administered 2022-04-26 – 2022-04-27 (×2): 1 via ORAL
  Filled 2022-04-25 (×2): qty 1

## 2022-04-25 MED ORDER — DOCUSATE SODIUM 100 MG PO CAPS
100.0000 mg | ORAL_CAPSULE | Freq: Every day | ORAL | Status: DC
  Administered 2022-04-26 – 2022-04-27 (×2): 100 mg via ORAL
  Filled 2022-04-25 (×2): qty 1

## 2022-04-25 MED ORDER — MAGNESIUM SULFATE BOLUS VIA INFUSION
4.0000 g | Freq: Once | INTRAVENOUS | Status: AC
  Administered 2022-04-25: 4 g via INTRAVENOUS
  Filled 2022-04-25: qty 1000

## 2022-04-25 MED ORDER — LACTATED RINGERS IV SOLN: INTRAVENOUS | Status: AC

## 2022-04-25 MED ORDER — SODIUM CHLORIDE 0.9 % IV SOLN
3.0000 g | Freq: Four times a day (QID) | INTRAVENOUS | Status: DC
  Filled 2022-04-25 (×3): qty 8

## 2022-04-25 MED ORDER — ASPIRIN 81 MG PO TBEC
81.0000 mg | DELAYED_RELEASE_TABLET | Freq: Every day | ORAL | Status: DC
  Administered 2022-04-25 – 2022-04-27 (×3): 81 mg via ORAL
  Filled 2022-04-25 (×3): qty 1

## 2022-04-25 MED ORDER — ZOLPIDEM TARTRATE 5 MG PO TABS
5.0000 mg | ORAL_TABLET | Freq: Every evening | ORAL | Status: DC | PRN
Start: 2022-04-25 — End: 2022-04-27
  Administered 2022-04-25 – 2022-04-26 (×2): 5 mg via ORAL
  Filled 2022-04-25 (×2): qty 1

## 2022-04-25 MED ORDER — MAGNESIUM SULFATE 40 GM/1000ML IV SOLN
2.0000 g/h | INTRAVENOUS | Status: AC
  Filled 2022-04-25 (×2): qty 1000

## 2022-04-25 NOTE — H&P (Addendum)
History     CSN: [id number]  Arrival date and time: 04/25/22 1525   Event Date/Time   First Provider Initiated Contact with Patient 04/25/22 1538      Chief Complaint  Patient presents with   cervical funneling   HPI  Shatasia Cutshaw is a 26 y.o. [id number] at [redacted]w[redacted]d with di/di twins who presents for evaluation of possible cervical dilation. CNM received report from Dr. Grace Bushy while patient was at [facility] for her anatomy scan. MD states he is seeing possible dilation of her cervix at or beyond the cerclage and patient was coming for further evaluation.   Patient states she is not having any pain, vaginal bleeding or discharge. Denies any constipation, diarrhea or any urinary complaints. Reports normal fetal movement of both babies. She states she is having no symptoms and was just coming for her anatomy scan.   She had a prophylactic cerclage placed at 15 weeks due to a hx of cervical insuffiencey leading to a 23 week delivery in a previous pregnancy.   OB History     Gravida  8   Para  2   Term  1   Preterm  1   AB  5   Living  1      SAB  4   IAB  1   Ectopic  0   Multiple  0   Live Births  2        Obstetric Comments  02-06-16: Preterm child deceased due to loss from incompetent cervix (Had rescue cerclage)         Past Medical History:  Diagnosis Date   COVID-19 08/2019   Headache    History of pre-eclampsia in prior pregnancy, currently pregnant    Infection    UTI   Obesities, morbid (HCC)    Preterm labor     Past Surgical History:  Procedure Laterality Date   CERVICAL CERCLAGE     CERVICAL CERCLAGE N/A 12/26/2020   Procedure: CERCLAGE CERVICAL;  Surgeon: Catalina Antigua, MD;  Location: [facility] LD ORS;  Service: Gynecology;  Laterality: N/A;   CERVICAL CERCLAGE N/A 03/13/2022   Procedure: CERCLAGE CERVICAL;  Surgeon: Warden Fillers, MD;  Location: [facility] LD ORS;  Service: Gynecology;  Laterality: N/A;   WISDOM TOOTH EXTRACTION      Family History   Problem Relation Age of Onset   Diabetes Mother    Alcohol abuse Father    Diabetes Sister    Asthma Neg Hx    Heart disease Neg Hx    Hypertension Neg Hx    Cancer Neg Hx    Birth defects Neg Hx    Stroke Neg Hx     Social History   Tobacco Use   Smoking status: Former    Types: Cigarettes    Quit date: 11/21/2018    Years since quitting: 3.4   Smokeless tobacco: Never   Tobacco comments:    vapes  Vaping Use   Vaping Use: Former   Quit date: 11/08/2020   Substances: Nicotine, Flavoring  Substance Use Topics   Alcohol use: Not Currently    Comment: occasionally, weekends   Drug use: Not Currently    Types: Marijuana    Comment: 04/22/2015 quit using marijuana     Allergies: No Known Allergies  Facility-Administered Medications Prior to Admission  Medication Dose Route Frequency Provider Last Rate Last Admin   hydrocortisone-pramoxine (PROCTOFOAM-HC) rectal foam 1 applicator  1 applicator Rectal BID Warden Fillers, MD  Medications Prior to Admission  Medication Sig Dispense Refill Last Dose   acetaminophen (TYLENOL) 500 MG tablet Take 500 mg by mouth every 6 (six) hours as needed for moderate pain.      aspirin EC 81 MG tablet Take 2 tablets (162 mg total) by mouth daily. Swallow whole. (Patient taking differently: Take 81 mg by mouth daily. Swallow whole.) 120 tablet 1    ibuprofen (ADVIL) 600 MG tablet Take 1 tablet (600 mg total) by mouth every 6 (six) hours. For a total of 48 hours (Patient not taking: Reported on 03/22/2022) 8 tablet 0    Misc. Devices (GOJJI WEIGHT SCALE) MISC 1 Device by Does not apply route as needed. 1 each 0    Prenatal Vit-Fe Fumarate-FA (PRENATAL MULTIVITAMIN) TABS tablet Take 1 tablet by mouth daily at 12 noon. 90 tablet 1     Review of Systems  Constitutional: Negative.  Negative for fatigue and fever.  HENT: Negative.    Respiratory: Negative.  Negative for shortness of breath.   Cardiovascular: Negative.  Negative for chest  pain.  Gastrointestinal: Negative.  Negative for abdominal pain, constipation, diarrhea, nausea and vomiting.  Genitourinary: Negative.  Negative for dysuria, vaginal bleeding and vaginal discharge.  Neurological: Negative.  Negative for dizziness and headaches.   Physical Exam   Last menstrual period 11/26/2021, not currently breastfeeding.  No data found.  Physical Exam Vitals and nursing note reviewed.  Constitutional:      General: She is not in acute distress.    Appearance: She is well-developed.  HENT:     Head: Normocephalic.  Eyes:     Pupils: Pupils are equal, round, and reactive to light.  Cardiovascular:     Rate and Rhythm: Normal rate and regular rhythm.     Heart sounds: Normal heart sounds.  Pulmonary:     Effort: Pulmonary effort is normal. No respiratory distress.     Breath sounds: Normal breath sounds.  Abdominal:     General: Bowel sounds are normal. There is no distension.     Palpations: Abdomen is soft.     Tenderness: There is no abdominal tenderness.  Genitourinary:    Comments: SSE: cervix visually closed with no membranes bulging, bleeding or discharge Skin:    General: Skin is warm and dry.  Neurological:     Mental Status: She is alert and oriented to person, place, and time.  Psychiatric:        Mood and Affect: Mood normal.        Behavior: Behavior normal.        Thought Content: Thought content normal.        Judgment: Judgment normal.      MAU Course  Procedures  MDM CNM consulted with Dr. Alysia Penna regarding report from MFM- MD will come see patient.   Dr. Alysia Penna performed speculum exam with no evidence of bulging membranes. Cervical os 0.5cm/50/ballotable  MD at bedside to discuss options. Will admit to [facility] for observation for now.   Assessment and Plan   1. Cervical insufficiency during pregnancy in second trimester, antepartum   2. [redacted] weeks gestation of pregnancy   3. Dichorionic diamniotic twin pregnancy in second  trimester    -Admit to [facility] -Care turned over to MD   Pt seen examined. SSE as noted above. Pt denies any Ut ctx, cramps, vaginal bleeding, LOF or recent IC. Even through pelvic exam does not show hour glassing membranes as noted on U/S, concerned for preterm ut ctx  and given h/o IC with cerclage in place. Feel it is best to admit pt for observation, magnesium therpay, vaginal Prometrium and strict bed rest. Will repeat U/S tomorrow for reevaluation of cervix. Will hold on BMZ as previable at this time. POC reviewed with pt and partner.   Nettie Elm, MD  Rolm Bookbinder, CNM 04/25/2022, 3:38 PM

## 2022-04-25 NOTE — MAU Note (Signed)
Cynthia Valenzuela is a 26 y.o. at [redacted]w[redacted]d here in MAU reporting: sent from MFM, twin preg, was having anatomy scan today.  Told there were cervical funneling, and to come here. Pt unsure what that means.  Onset of complaint: today- just left appt Pain score: 0 Vitals:   04/25/22 1538 04/25/22 1542  BP:  127/66  Pulse: 100 100  Resp: 20   Temp: 99.1 F (37.3 C)   SpO2: 100%      XHF:SFSELT to get with doppler, due to twins and habitus Lab orders placed from triage:  none

## 2022-04-25 NOTE — Plan of Care (Signed)
  Problem: Education: Goal: Knowledge of General Education information will improve Description: Including pain rating scale, medication(s)/side effects and non-pharmacologic comfort measures Outcome: Completed/Met

## 2022-04-25 NOTE — Progress Notes (Signed)
Diamniotic Dichorionic pregnancy at an advanced gestational age of [redacted] weeks.  She is seen at ther request of  Dr. Merian Capron for a detailed exam and to evaluate the cervix given a history of cervical insufficiency.   Cynthia Valenzuela is a G8P1 who is here for a detailed examination for DiDi twin pregnancy. Normal anatomy with good amniotic fluid and fetal movement was observed in Twin A and B. Suboptimal views of the fetal anatomy were obtained secondary to fetal position and advance gestational age.  Twin discordance of 2%.  I reviewed the normal nature of today's ultrasound. Cynthia Valenzuela conveyed that she is taking low dose aspirin for preeclampsia prevention, as she was further along in gestation at the time of her prenatal care.  She had a low risk NIPS and negative AFP.   We reviewed the sonographic findings and limitations of ultrasound. The potential risks associated with a twin gestation were discussed.  This discussion included a review of the increased risk of miscarriages, anomalies, preterm labor, and/or delivery, malpresentation, delivery via cesarean section, gestational diabetes, and/or preeclampsia.  With regards to fetal risks, there is an increased risk for fetal growth restriction of one or both twins, preterm labor, and associated morbidity, and intrauterine fetal demise.    We recommend growth scans every 4 weeks starting at 24 weeks with the initation of weekly antenatal testing in the form of twice weekly NST or weekly BPP should abnormal fetal growth or intertwin discordance of greater than 20-25% is noted.   In addition, Cynthia Valenzuela has a history of cervical insufficiency. She had a cerclage placed early in the pregnancy. However, today we observed a cervical funneling to the cerclage. It was not certain if there is any measurable cervix left.  Given this I recommended that she go the MAU to evaluate whether there is tension on the cerclage and to see if the cerclage  needs to be removed.  If not, we recommend observation and not a revision of the cerclage. Consider BMZ at [redacted] weeks gestation.   I discussed the plan of care with Cynthia Valenzuela, CNM and Dr. Nettie Elm.  I spent 20 minutes with > 50% in face to face consulation.   Cynthia Olive, MD.

## 2022-04-26 ENCOUNTER — Observation Stay (HOSPITAL_BASED_OUTPATIENT_CLINIC_OR_DEPARTMENT_OTHER): Payer: Medicaid Other

## 2022-04-26 DIAGNOSIS — O09892 Supervision of other high risk pregnancies, second trimester: Secondary | ICD-10-CM

## 2022-04-26 DIAGNOSIS — Z6841 Body Mass Index (BMI) 40.0 and over, adult: Secondary | ICD-10-CM

## 2022-04-26 DIAGNOSIS — O3432 Maternal care for cervical incompetence, second trimester: Principal | ICD-10-CM

## 2022-04-26 DIAGNOSIS — Z3A21 21 weeks gestation of pregnancy: Secondary | ICD-10-CM | POA: Diagnosis not present

## 2022-04-26 DIAGNOSIS — O30042 Twin pregnancy, dichorionic/diamniotic, second trimester: Secondary | ICD-10-CM

## 2022-04-26 DIAGNOSIS — O09292 Supervision of pregnancy with other poor reproductive or obstetric history, second trimester: Secondary | ICD-10-CM

## 2022-04-26 DIAGNOSIS — Z8759 Personal history of other complications of pregnancy, childbirth and the puerperium: Secondary | ICD-10-CM | POA: Diagnosis not present

## 2022-04-26 DIAGNOSIS — O99212 Obesity complicating pregnancy, second trimester: Secondary | ICD-10-CM | POA: Diagnosis not present

## 2022-04-26 LAB — MAGNESIUM: Magnesium: 3.8 mg/dL — ABNORMAL HIGH (ref 1.7–2.4)

## 2022-04-26 MED ORDER — INDOMETHACIN 25 MG PO CAPS
25.0000 mg | ORAL_CAPSULE | Freq: Four times a day (QID) | ORAL | Status: DC
  Administered 2022-04-27 (×2): 25 mg via ORAL
  Filled 2022-04-26 (×4): qty 1

## 2022-04-26 MED ORDER — LACTATED RINGERS IV BOLUS
1000.0000 mL | Freq: Once | INTRAVENOUS | Status: AC
  Administered 2022-04-26: 1000 mL via INTRAVENOUS

## 2022-04-26 MED ORDER — INDOMETHACIN 25 MG PO CAPS
50.0000 mg | ORAL_CAPSULE | Freq: Once | ORAL | Status: AC
  Administered 2022-04-26: 50 mg via ORAL
  Filled 2022-04-26: qty 2

## 2022-04-26 MED ORDER — INDOMETHACIN 25 MG PO CAPS
25.0000 mg | ORAL_CAPSULE | ORAL | Status: DC
Start: 2022-04-27 — End: 2022-04-26

## 2022-04-26 NOTE — Progress Notes (Signed)
Patient ID: Khaniya Tenaglia, female   DOB: [birth year], 26 y.o.   MRN: [id number] [facility] ANTEPARTUM COMPREHENSIVE PROGRESS NOTE  Alexzia Kasler is a 26 y.o. [id number] at [redacted]w[redacted]d  who is admitted for incompetent cervix in setting of Di/Di twins and cercalge in place.   Fetal presentation is unsure. Length of Stay:  1  Days  Subjective: Pt reports an occ mild cramp.  Patient reports good fetal movement.  She reports no frank uterine contractions, no bleeding and no loss of fluid per vagina.  Vitals:  Blood pressure 123/63, pulse (!) 101, temperature 98.3 F (36.8 C), temperature source Oral, resp. rate 17, height 5\' 5"  (1.651 m), weight (!) 165.5 kg, last menstrual period 11/26/2021, SpO2 100 %, not currently breastfeeding.  Physical Examination: Lungs clear Heart RRR Abd soft + BS  gravid GU deffered Ext non tender   Fetal Monitoring:  FHT's x 2  Labs:  Results for orders placed or performed during the hospital encounter of 04/25/22 (from the past 24 hour(s))  CBC   Collection Time: 04/25/22  5:10 PM  Result Value Ref Range   WBC 8.3 4.0 - 10.5 K/uL   RBC 4.25 3.87 - 5.11 MIL/uL   Hemoglobin 12.0 12.0 - 15.0 g/dL   HCT 04/27/22 31.5 - 40.0 %   MCV 84.7 80.0 - 100.0 fL   MCH 28.2 26.0 - 34.0 pg   MCHC 33.3 30.0 - 36.0 g/dL   RDW 86.7 61.9 - 50.9 %   Platelets 368 150 - 400 K/uL   nRBC 0.0 0.0 - 0.2 %  Comprehensive metabolic panel   Collection Time: 04/25/22  5:10 PM  Result Value Ref Range   Sodium 138 135 - 145 mmol/L   Potassium 3.6 3.5 - 5.1 mmol/L   Chloride 107 98 - 111 mmol/L   CO2 22 22 - 32 mmol/L   Glucose, Bld 89 70 - 99 mg/dL   BUN <5 (L) 6 - 20 mg/dL   Creatinine, Ser 04/27/22 (L) 0.44 - 1.00 mg/dL   Calcium 9.2 8.9 - 7.12 mg/dL   Total Protein 6.7 6.5 - 8.1 g/dL   Albumin 2.9 (L) 3.5 - 5.0 g/dL   AST 16 15 - 41 U/L   ALT 24 0 - 44 U/L   Alkaline Phosphatase 62 38 - 126 U/L   Total Bilirubin 0.4 0.3 - 1.2 mg/dL   GFR, Estimated 45.8 >09 mL/min   Anion  gap 9 5 - 15  Type and screen [facility]   Collection Time: 04/25/22  5:10 PM  Result Value Ref Range   ABO/RH(D) A POS    Antibody Screen NEG    Sample Expiration      04/28/2022,2359 Performed at [facility], [address] [place], [place]   Protein / creatinine ratio, urine   Collection Time: 04/25/22  8:51 PM  Result Value Ref Range   Creatinine, Urine 37 mg/dL   Total Protein, Urine <6 mg/dL   Protein Creatinine Ratio        0.00 - 0.15 mg/mg[Cre]    Imaging Studies:    See U/S    Medications:  Scheduled  aspirin EC  81 mg Oral Daily   docusate sodium  100 mg Oral Daily   prenatal multivitamin  1 tablet Oral Q1200   progesterone  200 mg Vaginal QHS   I have reviewed the patient's current medications.  ASSESSMENT: IUP 21 4/7 weeks Incompetent cervix with cerclage in place Dynamic  LUS with funneling to cerclage  PLAN: Stable. U/S better than yesterday but still funneling and dynamic LUS. Will complete 24 hrs magnesium. D/c foley. Increase activity to bed rest with bathroom privileges.  Discussed management options as outlined by MFM ( see MFM U/S note), no readmission without pt BMZ and attempt to placed a second rescue cerclage. R/B of each reviewed with pt. Pt is undecided at this point. Will reevaluate tomorrow with plan to discharge home if remains stable and no change in cervical exam,  Continue routine antenatal care.   Hermina Staggers 04/26/2022,3:05 PM

## 2022-04-27 ENCOUNTER — Encounter (HOSPITAL_COMMUNITY): Payer: Self-pay | Admitting: Obstetrics and Gynecology

## 2022-04-27 DIAGNOSIS — O3432 Maternal care for cervical incompetence, second trimester: Secondary | ICD-10-CM | POA: Diagnosis present

## 2022-04-27 DIAGNOSIS — Z87891 Personal history of nicotine dependence: Secondary | ICD-10-CM | POA: Diagnosis not present

## 2022-04-27 DIAGNOSIS — Z3A21 21 weeks gestation of pregnancy: Secondary | ICD-10-CM

## 2022-04-27 DIAGNOSIS — Z8616 Personal history of COVID-19: Secondary | ICD-10-CM | POA: Diagnosis not present

## 2022-04-27 DIAGNOSIS — Z7982 Long term (current) use of aspirin: Secondary | ICD-10-CM | POA: Diagnosis not present

## 2022-04-27 DIAGNOSIS — N883 Incompetence of cervix uteri: Secondary | ICD-10-CM | POA: Diagnosis not present

## 2022-04-27 DIAGNOSIS — O30042 Twin pregnancy, dichorionic/diamniotic, second trimester: Secondary | ICD-10-CM | POA: Diagnosis present

## 2022-04-27 DIAGNOSIS — O30043 Twin pregnancy, dichorionic/diamniotic, third trimester: Secondary | ICD-10-CM

## 2022-04-27 MED ORDER — ONDANSETRON HCL 4 MG PO TABS: 4.0000 mg | ORAL_TABLET | ORAL | Status: DC | PRN

## 2022-04-27 MED ORDER — DIPHENHYDRAMINE HCL 25 MG PO CAPS: 25.0000 mg | ORAL_CAPSULE | Freq: Four times a day (QID) | ORAL | Status: DC | PRN

## 2022-04-27 MED ORDER — DIBUCAINE (PERIANAL) 1 % EX OINT: 1.0000 | TOPICAL_OINTMENT | CUTANEOUS | Status: DC | PRN

## 2022-04-27 MED ORDER — BENZOCAINE-MENTHOL 20-0.5 % EX AERO: 1.0000 | INHALATION_SPRAY | CUTANEOUS | Status: DC | PRN

## 2022-04-27 MED ORDER — ONDANSETRON HCL 4 MG/2ML IJ SOLN: 4.0000 mg | INTRAMUSCULAR | Status: DC | PRN

## 2022-04-27 MED ORDER — WITCH HAZEL-GLYCERIN EX PADS: 1.0000 | MEDICATED_PAD | CUTANEOUS | Status: DC | PRN

## 2022-04-27 MED ORDER — FENTANYL CITRATE (PF) 100 MCG/2ML IJ SOLN
INTRAMUSCULAR | Status: AC
  Filled 2022-04-27: qty 2

## 2022-04-27 MED ORDER — ZOLPIDEM TARTRATE 5 MG PO TABS
5.0000 mg | ORAL_TABLET | Freq: Once | ORAL | Status: AC
Start: 2022-04-27 — End: 2022-04-27
  Administered 2022-04-27: 5 mg via ORAL
  Filled 2022-04-27: qty 1

## 2022-04-27 MED ORDER — MEASLES, MUMPS & RUBELLA VAC IJ SOLR: 0.5000 mL | Freq: Once | INTRAMUSCULAR | Status: DC

## 2022-04-27 MED ORDER — FENTANYL CITRATE (PF) 100 MCG/2ML IJ SOLN
100.0000 ug | INTRAMUSCULAR | Status: DC | PRN
  Administered 2022-04-27 (×2): 100 ug via INTRAVENOUS
  Filled 2022-04-27: qty 2

## 2022-04-27 MED ORDER — MEDROXYPROGESTERONE ACETATE 150 MG/ML IM SUSP: 150.0000 mg | INTRAMUSCULAR | Status: DC | PRN

## 2022-04-27 MED ORDER — SENNOSIDES-DOCUSATE SODIUM 8.6-50 MG PO TABS
1.0000 | ORAL_TABLET | Freq: Two times a day (BID) | ORAL | Status: DC
  Administered 2022-04-27: 1 via ORAL
  Filled 2022-04-27 (×2): qty 1

## 2022-04-27 MED ORDER — SIMETHICONE 80 MG PO CHEW: 80.0000 mg | CHEWABLE_TABLET | ORAL | Status: DC | PRN

## 2022-04-27 MED ORDER — COCONUT OIL OIL: 1.0000 | TOPICAL_OIL | Status: DC | PRN

## 2022-04-27 MED ORDER — OXYTOCIN-SODIUM CHLORIDE 30-0.9 UT/500ML-% IV SOLN
INTRAVENOUS | Status: AC
  Filled 2022-04-27: qty 500

## 2022-04-27 MED ORDER — IBUPROFEN 600 MG PO TABS
600.0000 mg | ORAL_TABLET | Freq: Four times a day (QID) | ORAL | Status: DC
  Administered 2022-04-27 – 2022-04-28 (×3): 600 mg via ORAL
  Filled 2022-04-27 (×3): qty 1

## 2022-04-27 MED ORDER — OXYCODONE HCL 5 MG PO TABS
5.0000 mg | ORAL_TABLET | ORAL | Status: DC | PRN
  Administered 2022-04-27: 5 mg via ORAL
  Filled 2022-04-27: qty 1

## 2022-04-27 MED ORDER — TERBUTALINE SULFATE 1 MG/ML IJ SOLN
INTRAMUSCULAR | Status: AC
  Filled 2022-04-27: qty 1

## 2022-04-27 MED ORDER — SENNOSIDES-DOCUSATE SODIUM 8.6-50 MG PO TABS: 2.0000 | ORAL_TABLET | Freq: Every day | ORAL | Status: DC

## 2022-04-27 MED ORDER — TERBUTALINE SULFATE 1 MG/ML IJ SOLN
0.2500 mg | Freq: Once | INTRAMUSCULAR | Status: AC
Start: 2022-04-27 — End: 2022-04-27
  Administered 2022-04-27: 0.25 mg via SUBCUTANEOUS

## 2022-04-27 MED ORDER — ACETAMINOPHEN 325 MG PO TABS: 650.0000 mg | ORAL_TABLET | ORAL | Status: DC | PRN

## 2022-04-27 MED ORDER — ENOXAPARIN SODIUM 100 MG/ML IJ SOSY
0.5000 mg/kg | PREFILLED_SYRINGE | INTRAMUSCULAR | Status: DC
Start: 2022-04-27 — End: 2022-04-27
  Administered 2022-04-27: 82.5 mg via SUBCUTANEOUS
  Filled 2022-04-27: qty 0.82

## 2022-04-27 MED ORDER — PRENATAL MULTIVITAMIN CH: 1.0000 | ORAL_TABLET | Freq: Every day | ORAL | Status: DC

## 2022-04-27 MED ORDER — TETANUS-DIPHTH-ACELL PERTUSSIS 5-2.5-18.5 LF-MCG/0.5 IM SUSY: 0.5000 mL | PREFILLED_SYRINGE | Freq: Once | INTRAMUSCULAR | Status: DC

## 2022-04-27 MED ORDER — OXYCODONE HCL 5 MG PO TABS: 10.0000 mg | ORAL_TABLET | ORAL | Status: DC | PRN

## 2022-04-27 MED ORDER — LACTATED RINGERS IV SOLN
INTRAVENOUS | Status: DC
  Administered 2022-04-27 (×2): 125 mL/h via INTRAVENOUS

## 2022-04-27 MED ORDER — TERBUTALINE SULFATE 1 MG/ML IJ SOLN
0.2500 mg | Freq: Once | INTRAMUSCULAR | Status: AC
Start: 2022-04-27 — End: 2022-04-27
  Administered 2022-04-27: 0.25 mg via SUBCUTANEOUS
  Filled 2022-04-27: qty 1

## 2022-04-27 NOTE — Progress Notes (Signed)
Patient ID: Cynthia Valenzuela, female   DOB: November 15, 1995, 26 y.o.   MRN: [id number] Late entry  CTSP by nursing as pt feels some "coming out of her vagina while in rest room"  Nurse instructed to return pt to bed.  Upon arrival to pt's room, vaginal exam revealed bulging bag of membranes passed the introits. Very gently the membranes were reduced to passed the cervix. Cervix noted to be 3-4 cm dilated/ 50 % with cerclage in tact.  Management options reviewed with pt. Conservative vs transfer to [facility] for management of severe prematurity. NICU consult requested.  In the mean time will replaced foley, [place], bed rest, continue with Prometrium and Indocin at this time.  Will return and discuss further management after NICU consult.

## 2022-04-27 NOTE — Progress Notes (Signed)
Patient ID: Cynthia Valenzuela, female   DOB: [birth year], 26 y.o.   MRN: [id number] Called to check patient  Has been having more intense and frequent contractions  Has gotten Indocin 50mg  2 hours ago and a fluid bolus Scheduled to get another 25mg   Cervix is very soft, not well defined Cerclage is palpable all the way around but cervix is quite effaced  No bleeding  Will try a dose of Terbutaline and consult with Dr 

## 2022-04-27 NOTE — Progress Notes (Signed)
Cynthia Valenzuela is a 26 y.o. [phone] at [redacted]w[redacted]d admitted for incompetent cervix now laboring  Subjective: Pt feeling CTX every 2-3 mins, painful.  Objective: BP 90/77   Pulse 100   Temp 98.6 F (37 C) (Oral)   Resp 16   Ht 5\' 5"  (1.651 m)   Wt (!) 165.5 kg   LMP 11/26/2021   SpO2 98%   BMI 60.72 kg/m  I/O last 3 completed shifts: In: 9450.7 [P.O.:3850; I.V.:3963.5; IV Piggyback:1637.2] Out: 5450 [Urine:5450] Total I/O In: -  Out: 625 [Urine:625]  SVE:  Bulging bag almost to introitus, definitive cervix not felt and being careful not to rupture bag   Labs: Lab Results  Component Value Date   WBC 8.3 04/25/2022   HGB 12.0 04/25/2022   HCT 36.0 04/25/2022   MCV 84.7 04/25/2022   PLT 368 04/25/2022    Assessment / Plan: Incompetent cervix now laboring  Pt does not want to stop contractions or be transferred to wFU where 22 week neonates are resuscitated. Pt would like pain meds and will transfer to L & D for management.  Will be able to assess cerclage better with stirrups.  It does not feel to be intact at this time.   04/27/2022, MD 04/27/2022, 1:02 PM

## 2022-04-27 NOTE — Progress Notes (Signed)
Dr. Alysia Penna at bedside to takeover care from Dr. Reina Fuse.    Placenta delivered intact with gentle cord traction - sent to pathology. Pitocin infusion started per protocol. Cerclage suture removed by Dr. Alysia Penna without complication. Fundus firm. Bleeding stable. No perineal lacerations noted.   Babies with mom and partner at bedside. Plan for transfer to [facility] once recovered on L&D.   Evalina Field, MD

## 2022-04-27 NOTE — Progress Notes (Signed)
Small amount red vaginal bleeding noted on pad at this time.  Patient denies any pain or contractions.

## 2022-04-27 NOTE — Consult Note (Signed)
Neonatology Antenatal Consultation Requested by: Alysia Penna Reason: pre-term labor at [redacted] weeks EGA, twins  I discussed the low survival rate at this gestation which steadily improves with longer pregnancies.  I also discussed the high rate of long-term neurologic problems among survivors born at 42 weeks which also improves with increasing gestational age.  Parents are desirous of transfer since our hospital is at capacity for ELBW newborns.  I discussed with Dr. Alysia Penna as well.  R.L. Minus Breeding.D.

## 2022-04-28 LAB — CBC
HCT: 29.6 % — ABNORMAL LOW (ref 36.0–46.0)
Hemoglobin: 9.5 g/dL — ABNORMAL LOW (ref 12.0–15.0)
MCH: 28.1 pg (ref 26.0–34.0)
MCHC: 32.1 g/dL (ref 30.0–36.0)
MCV: 87.6 fL (ref 80.0–100.0)
Platelets: 317 10*3/uL (ref 150–400)
RBC: 3.38 MIL/uL — ABNORMAL LOW (ref 3.87–5.11)
RDW: 15.5 % (ref 11.5–15.5)
WBC: 9.7 10*3/uL (ref 4.0–10.5)
nRBC: 0 % (ref 0.0–0.2)

## 2022-04-28 MED ORDER — IBUPROFEN 600 MG PO TABS: 600.0000 mg | ORAL_TABLET | Freq: Four times a day (QID) | ORAL | 1 refills | Status: AC

## 2022-04-28 MED ORDER — OXYCODONE HCL 5 MG PO TABS: 5.0000 mg | ORAL_TABLET | ORAL | 0 refills | Status: DC | PRN

## 2022-04-28 MED ORDER — ZOLPIDEM TARTRATE 5 MG PO TABS: 5.0000 mg | ORAL_TABLET | Freq: Every evening | ORAL | 0 refills | Status: AC | PRN

## 2022-04-28 NOTE — Discharge Summary (Signed)
Postpartum Discharge Summary  Date of Service updated 04/28/22     Patient Name: Cynthia Valenzuela DOB: [birth year] MRN: [id number]  Date of admission: 04/25/2022 Delivery date:   Stephanee, Barcomb [[id number]]  04/27/2022    Zanaria, Morell [[id number]]  04/27/2022 Delivering provider:    Katherine Mantle [[id number]]  SHIVAJI, Maurene, Hollin [place] [[id number]]  [facility], Cathlean [place] M Date of discharge: 04/28/2022  Admitting diagnosis: Incompetent cervix [N88.3] Incompetence of cervix [N88.3] Intrauterine pregnancy: [redacted]w[redacted]d    Secondary diagnosis:  Principal Problem:   History of Incompetent cervix Active Problems:   Incompetence of cervix  Additional problems: NA    Discharge diagnosis:  SVD of non viable twin gestation                                               Post partum procedures: NA Augmentation: N/A Complications:  Non viable 21 week twin gestation  Hospital course: [facility] [place] admitted with known incompetent cervix with cerclage in situ and dynamic uterine activity. Pt received magnesium and IV antibiotics x 24 hours. Started on Prometrium as well. Unfortunately pt progressed to labor. Pt was seen by NICU with management options for non viable twins discussed. Pt and husband desired expectant management after delivery. See delivery note for additional information  Post partum course was unremarkable. She progressed to ambulating, voiding, tolerating diet and good oral pain control.  Felt amendable for discharge home.  Magnesium Sulfate received: Yes: PTL BMZ received: No Rhophylac:No MMR:No T-DaP: NA Flu: N/A Transfusion:No  Physical exam  Vitals:   04/27/22 1714 04/27/22 1819 04/27/22 2252 04/28/22 0633  BP: 124/67 125/65 127/77 126/78  Pulse: 87 86 88 91  Resp: 19 18 20    Temp: 98.1 F (36.7 C) 98.5 F (36.9 C) 98.4 F (36.9 C) 98.5 F (36.9 C)  TempSrc: Oral Oral Oral Oral  SpO2: 99% 99% 98% 99%  Weight:       Height:       General: alert Lochia: appropriate Uterine Fundus: firm Incision: N/A DVT Evaluation: No evidence of DVT seen on physical exam. Labs: Lab Results  Component Value Date   WBC 9.7 04/28/2022   HGB 9.5 (L) 04/28/2022   HCT 29.6 (L) 04/28/2022   MCV 87.6 04/28/2022   PLT 317 04/28/2022      Latest Ref Rng & Units 04/25/2022    5:10 PM  CMP  Glucose 70 - 99 mg/dL 89   BUN 6 - 20 mg/dL <5   Creatinine 0.44 - 1.00 mg/dL 0.42   Sodium 135 - 145 mmol/L 138   Potassium 3.5 - 5.1 mmol/L 3.6   Chloride 98 - 111 mmol/L 107   CO2 22 - 32 mmol/L 22   Calcium 8.9 - 10.3 mg/dL 9.2   Total Protein 6.5 - 8.1 g/dL 6.7   Total Bilirubin 0.3 - 1.2 mg/dL 0.4   Alkaline Phos 38 - 126 U/L 62   AST 15 - 41 U/L 16   ALT 0 - 44 U/L 24    Edinburgh Score:    06/30/2021    9:37 AM  Edinburgh Postnatal Depression Scale Screening Tool  I have been able to laugh and see the funny side of things. 0  I have looked forward with enjoyment to things. 0  I have blamed myself unnecessarily when  things went wrong. 0  I have been anxious or worried for no good reason. 0  I have felt scared or panicky for no good reason. 0  Things have been getting on top of me. 0  I have been so unhappy that I have had difficulty sleeping. 0  I have felt sad or miserable. 0  I have been so unhappy that I have been crying. 0  The thought of harming myself has occurred to me. 0  Edinburgh Postnatal Depression Scale Total 0     After visit meds:  Allergies as of 04/28/2022   No Known Allergies      Medication List     STOP taking these medications    acetaminophen 500 MG tablet Commonly known as: TYLENOL   aspirin EC 81 MG tablet   Gojji Weight Scale Misc       TAKE these medications    ibuprofen 600 MG tablet Commonly known as: ADVIL Take 1 tablet (600 mg total) by mouth every 6 (six) hours. What changed:  when to take this additional instructions   oxyCODONE 5 MG immediate  release tablet Commonly known as: Oxy IR/ROXICODONE Take 1 tablet (5 mg total) by mouth every 4 (four) hours as needed (pain scale 4-7).   prenatal multivitamin Tabs tablet Take 1 tablet by mouth daily at 12 noon.   zolpidem 5 MG tablet Commonly known as: AMBIEN Take 1 tablet (5 mg total) by mouth at bedtime as needed for sleep.         Discharge home in stable conditio [facility]ant [place] Discharge instruction: per After Visit Summary and Postpartum booklet. Activity: Advance as tolerated. Pelvic rest for 6 weeks.  Diet: routine diet Future Appointments: Future Appointments  Date Time Provider [place]  05/03/2022  2:35 PM Griffin Basil, MD [facility]  05/10/2022  1:30 PM [facility] NURSE [facility]  05/10/2022  1:45 PM [facility] US4 [facility]  05/23/2022  1:45 PM [facility] NURSE [facility]  05/23/2022  2:00 PM [facility] US1 [facility] [place]   Follow up Visit:  [facility]. Schedule an appointment as soon as possible for a visit in 4 week(s).   Specialty: Obstetrics and Gynecology Contact information: [place] [phone] [phone]                 Please schedule this patient for a In person postpartum visit in 4 weeks with the following provider: MD. Additional Postpartum F/U: Implanon insertion   High risk pregnancy complicated by:  SVD of non viable twin gestation Delivery mode:     Jaeline, Whobrey [[id number]]  Vaginal, Spontaneous    Schylar, Allard [[id number]]  Vaginal, Spontaneous Anticipated Birth Control:  Nexplanon   04/28/2022 Chancy Milroy, MD

## 2022-04-30 ENCOUNTER — Other Ambulatory Visit: Payer: Self-pay | Admitting: *Deleted

## 2022-04-30 DIAGNOSIS — O09899 Supervision of other high risk pregnancies, unspecified trimester: Secondary | ICD-10-CM

## 2022-05-01 LAB — SURGICAL PATHOLOGY

## 2022-05-03 ENCOUNTER — Encounter: Payer: Medicaid Other | Admitting: Obstetrics and Gynecology

## 2022-05-05 ENCOUNTER — Telehealth (HOSPITAL_COMMUNITY): Payer: Self-pay

## 2022-05-05 NOTE — Telephone Encounter (Signed)
Patient did not answer phone call. Voicemail left for patient.   Marcelino Duster [facility] 05/05/2022,0939

## 2022-05-10 ENCOUNTER — Ambulatory Visit: Payer: Medicaid Other

## 2022-05-10 DIAGNOSIS — O0990 Supervision of high risk pregnancy, unspecified, unspecified trimester: Secondary | ICD-10-CM

## 2022-05-10 DIAGNOSIS — Z8759 Personal history of other complications of pregnancy, childbirth and the puerperium: Secondary | ICD-10-CM

## 2022-05-23 ENCOUNTER — Ambulatory Visit: Payer: Medicaid Other

## 2022-05-28 ENCOUNTER — Encounter: Payer: Self-pay | Admitting: Family Medicine

## 2022-05-28 ENCOUNTER — Other Ambulatory Visit: Payer: Self-pay

## 2022-05-28 ENCOUNTER — Ambulatory Visit (INDEPENDENT_AMBULATORY_CARE_PROVIDER_SITE_OTHER): Payer: Medicaid Other | Admitting: Family Medicine

## 2022-05-28 VITALS — BP 137/88 | HR 81 | Wt 357.9 lb

## 2022-05-28 DIAGNOSIS — Z3202 Encounter for pregnancy test, result negative: Secondary | ICD-10-CM | POA: Diagnosis not present

## 2022-05-28 DIAGNOSIS — Z30017 Encounter for initial prescription of implantable subdermal contraceptive: Secondary | ICD-10-CM

## 2022-05-28 LAB — POCT PREGNANCY, URINE: Preg Test, Ur: NEGATIVE

## 2022-05-28 MED ORDER — SERTRALINE HCL 50 MG PO TABS: 50.0000 mg | ORAL_TABLET | Freq: Every day | ORAL | 1 refills | Status: AC

## 2022-05-28 MED ORDER — ETONOGESTREL 68 MG ~~LOC~~ IMPL
68.0000 mg | DRUG_IMPLANT | Freq: Once | SUBCUTANEOUS | Status: AC
  Administered 2022-05-28: 68 mg via SUBCUTANEOUS

## 2022-05-28 NOTE — Progress Notes (Signed)
Post Partum Visit Note  Cynthia Valenzuela is a 26 y.o. [phone] female who presents for a postpartum visit. She is 4 weeks 3 days postpartum following a nonviable twin vaginal delivery at 21w 3d. I have fully reviewed the prenatal and intrapartum course.  Anesthesia: none. Postpartum course has been ok, has some anxiety and some sadness related to her perinatal loss. Bleeding no bleeding. Bowel function is normal. Bladder function is normal. Patient is not sexually active. Contraception method is Nexplanon. Postpartum depression screening: positive.   The pregnancy intention screening data noted above was reviewed. Potential methods of contraception were discussed. The patient elected to proceed with No data recorded.   Edinburgh Postnatal Depression Scale - 05/28/22 1436       Edinburgh Postnatal Depression Scale:  In the Past 7 Days   I have been able to laugh and see the funny side of things. 0    I have looked forward with enjoyment to things. 2    I have blamed myself unnecessarily when things went wrong. 2    I have been anxious or worried for no good reason. 3    I have felt scared or panicky for no good reason. 2    Things have been getting on top of me. 0    I have been so unhappy that I have had difficulty sleeping. 2    I have felt sad or miserable. 0    I have been so unhappy that I have been crying. 0    The thought of harming myself has occurred to me. 0    Edinburgh Postnatal Depression Scale Total 11             Health Maintenance Due  Topic Date Due   COVID-19 Vaccine (4 - Moderna series) 12/15/2020   INFLUENZA VACCINE  05/08/2022    The following portions of the patient's history were reviewed and updated as appropriate: allergies, current medications, past family history, past medical history, past social history, past surgical history, and problem list.  Review of Systems Pertinent items are noted in HPI.  Objective:  BP 137/88   Pulse 81   Wt (!)  357 lb 14.4 oz (162.3 kg)   LMP 11/26/2021   Breastfeeding No   BMI 59.56 kg/m    General:  alert, cooperative, appears stated age, and morbidly obese  Lungs: Normal effort  Heart:  regular rate and rhythm  Abdomen: soft, non-tender; bowel sounds normal; no masses,  no organomegaly      Procedure: Patient given informed consent, signed copy in the chart, time out was performed. Pregnancy test was negative. Appropriate time out taken.  Patient's left arm was prepped and draped in the usual sterile fashion.. The ruler used to measure and mark insertion area.  Pt was prepped with alcohol swab and then injected with 3 cc of 1% lidocaine with epinephrine.  Pt was prepped with betadine, Nexplanon removed form packaging,  Device confirmed in needle, then inserted full length of needle and withdrawn per handbook instructions.  Pt insertion site covered with steri-strips and pressure dressing.   Minimal blood loss.  Pt tolerated the procedure well.      Assessment:   Postpartum exam.   Plan:   Essential components of care per ACOG recommendations:  1.  Mood and well being: Patient with positive depression screening today. Reviewed local resources for support.  - Patient tobacco use? No.   - hx of drug use? No.  2. Sexuality, contraception and birth spacing - Patient does not want a pregnancy in the next year.  Desired family size is 2 children.  - Reviewed reproductive life planning. Reviewed contraceptive methods based on pt preferences and effectiveness.  Patient desired Hormonal Implant today.   - Discussed birth spacing of 18 months   3. Physical Recovery  - Discussed patients delivery and complications, including periviable loss with cerclage  - Patient is safe to resume physical and sexual activity  4.  Health Maintenance - HM due items addressed Yes - Last pap smear  Diagnosis  Date Value Ref Range Status  11/28/2020   Final   - Negative for intraepithelial lesion or  malignancy (NILM)   Pap smear not done at today's visit.  -Breast Cancer screening indicated? No.    Reva Bores, MD [facility], [facility]

## 2022-07-03 ENCOUNTER — Ambulatory Visit: Payer: Medicaid Other | Admitting: Family Medicine

## 2022-07-21 IMAGING — US US OB < 14 WEEKS - US OB TV
1 series · 15 of 28 positions shown · non-contrast
Comparison: None.

CLINICAL DATA: Vaginal bleeding

EXAM:
OBSTETRIC <14 WK US AND TRANSVAGINAL OB US
TECHNIQUE: Both transabdominal and transvaginal ultrasound examinations were
performed for complete evaluation of the gestation as well as the
maternal uterus, adnexal regions, and pelvic cul-de-sac.
Transvaginal technique was performed to assess early pregnancy.

[Series 1: us ob < 14 weeks - us ob tv · 15 of 56 slices shown]
[im 1/56]
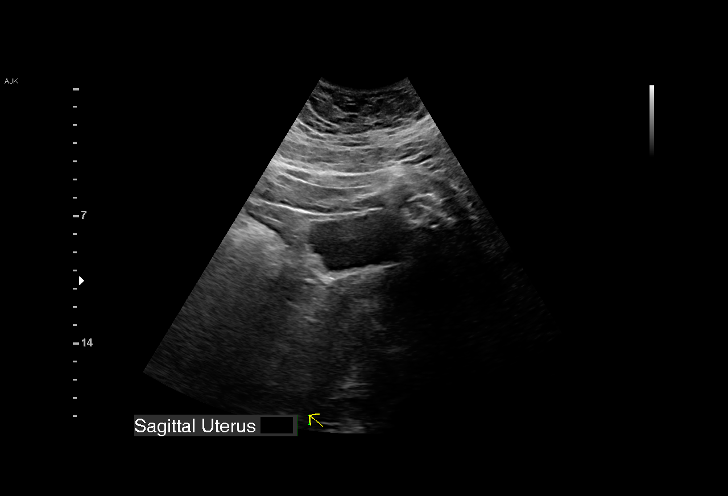
[im 5/56]
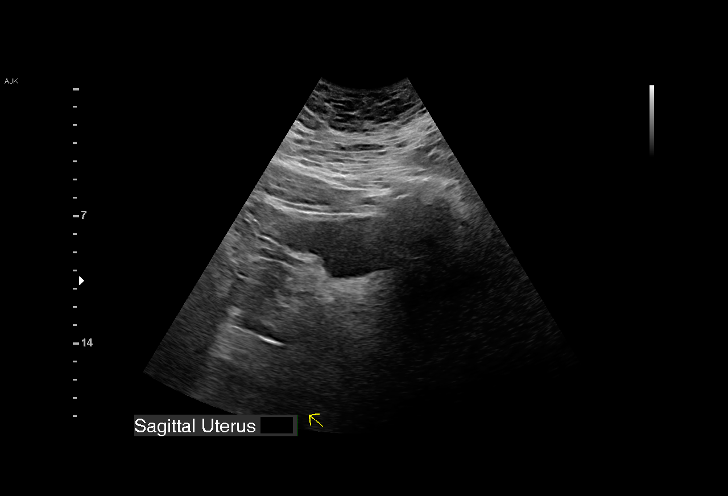
[im 9/56]
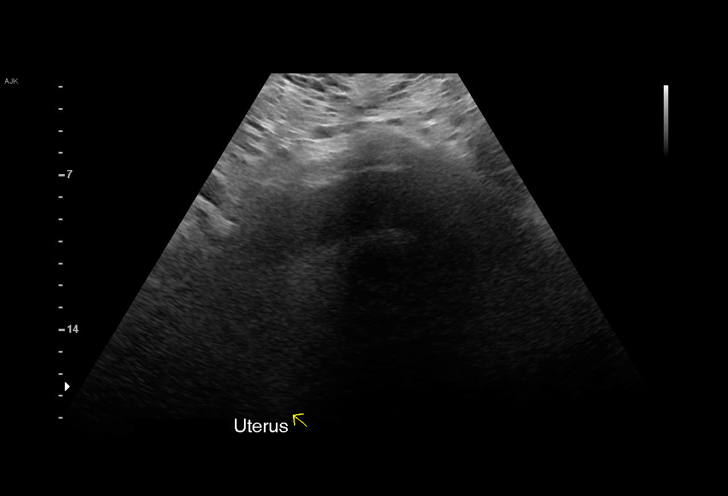
[im 13/56]
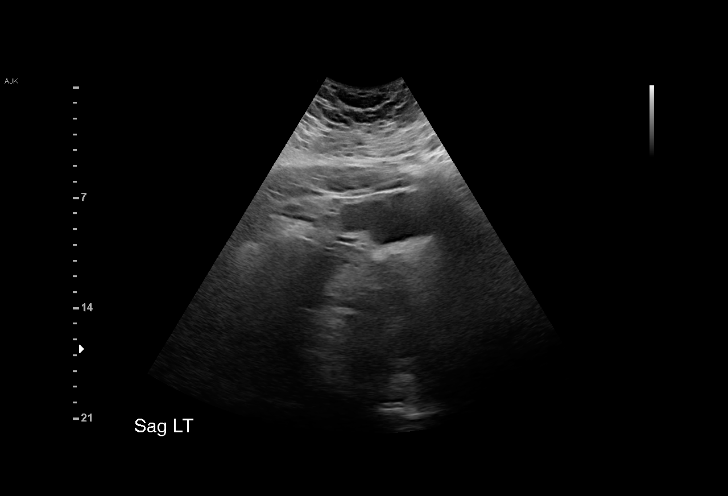
[im 17/56]
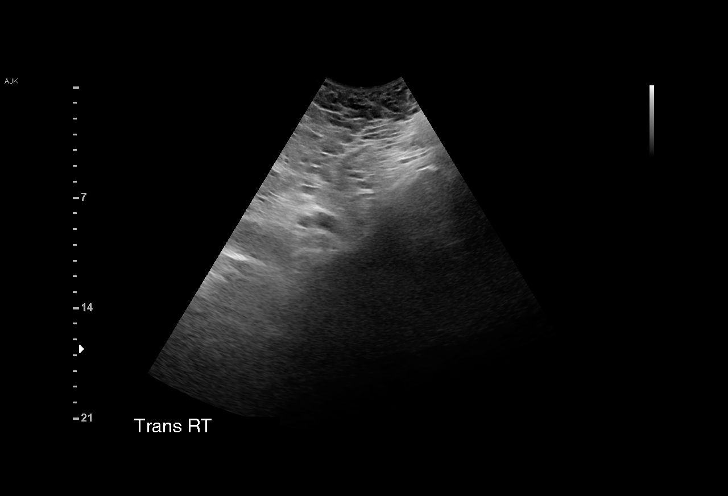
[im 21/56]
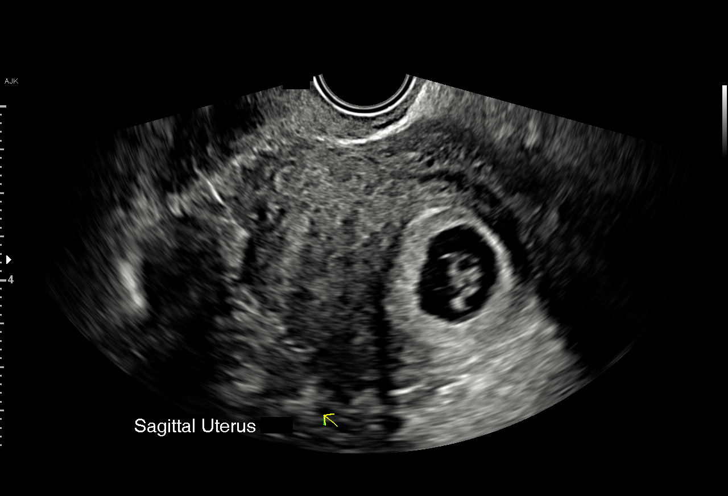
[im 25/56]
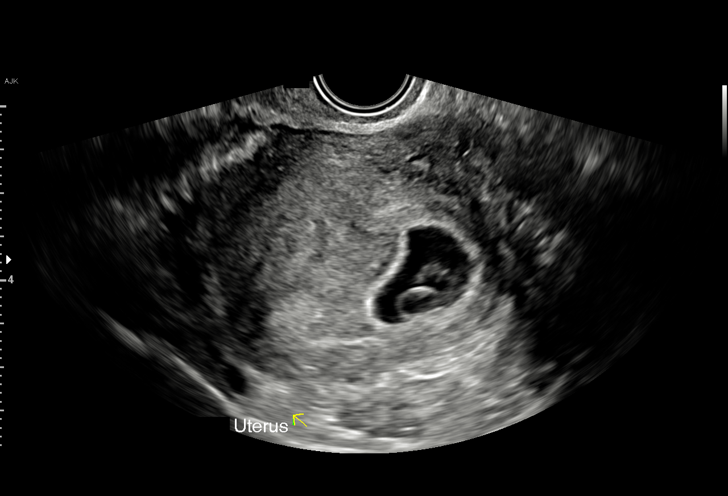
[im 29/56]
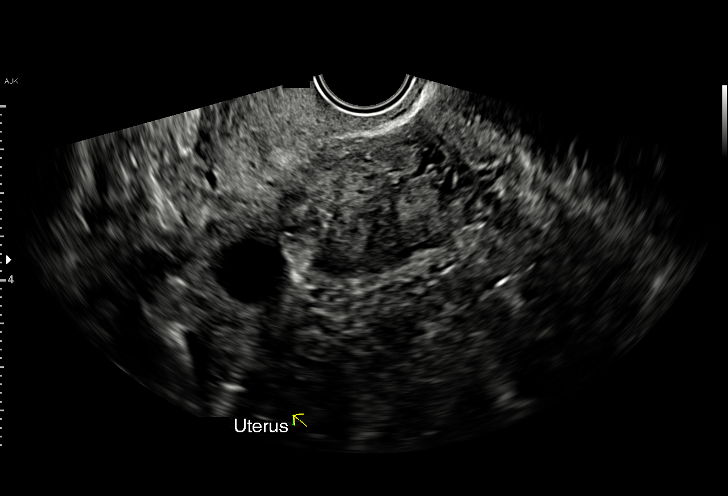
[im 31/56]
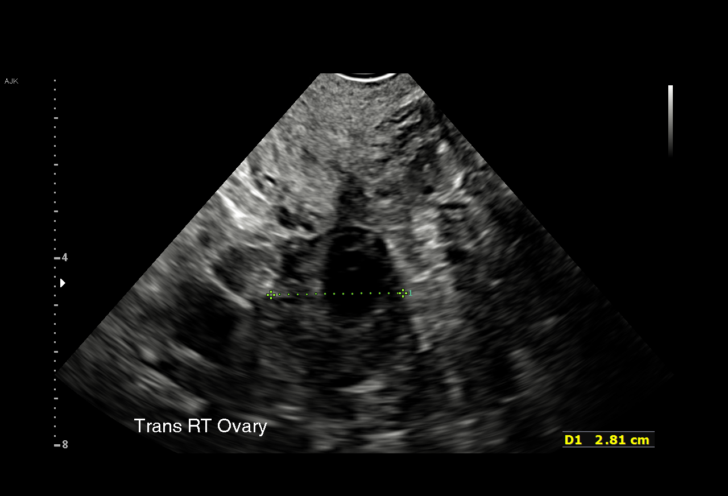
[im 35/56]
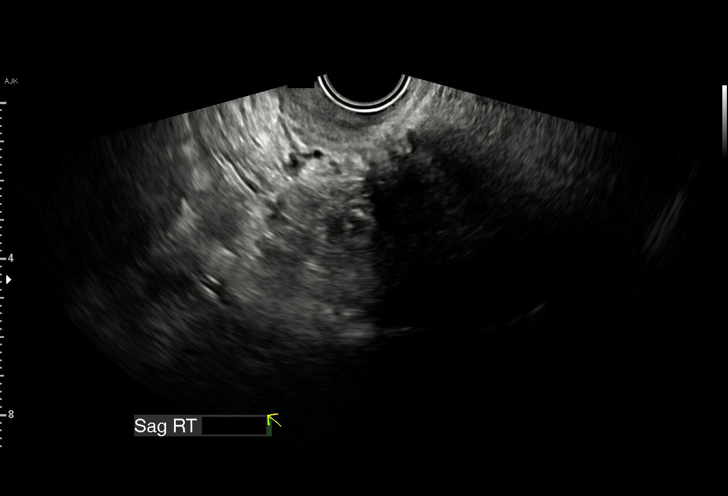
[im 39/56]
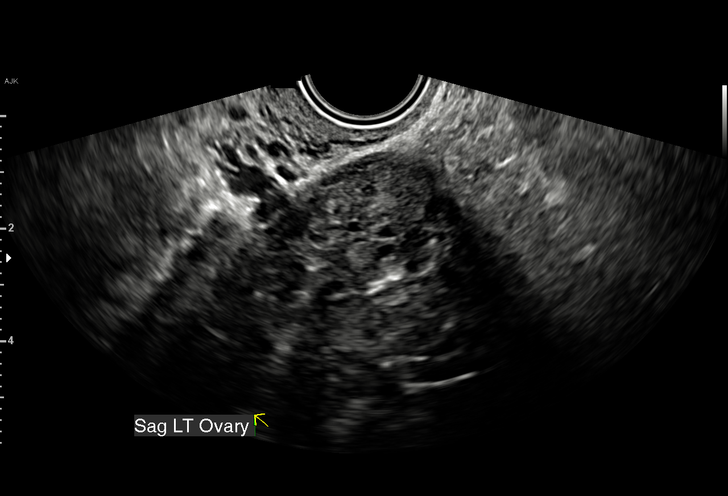
[im 43/56]
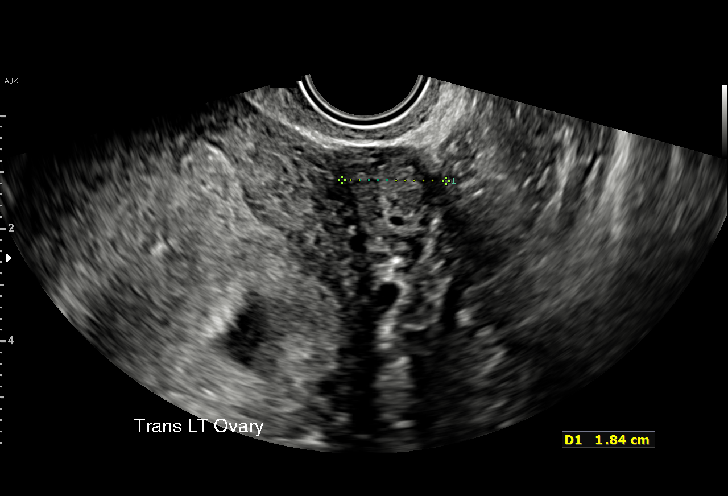
[im 47/56]
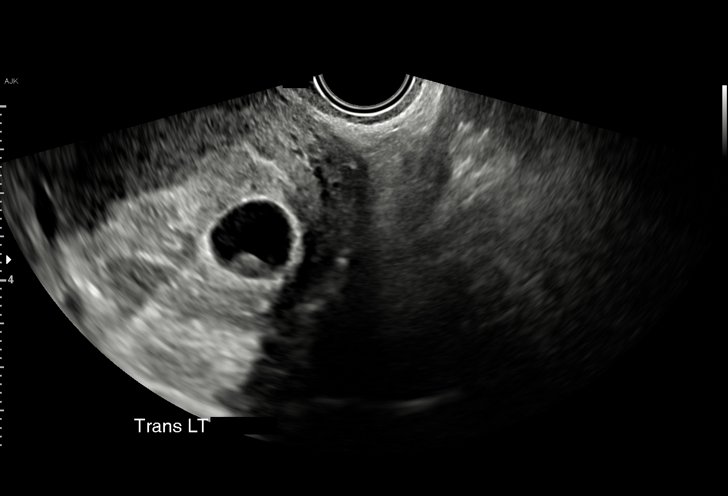
[im 51/56]
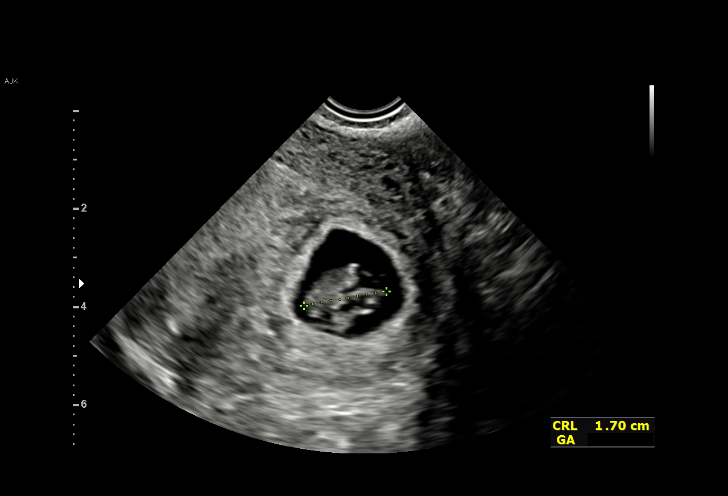
[im 56/56]
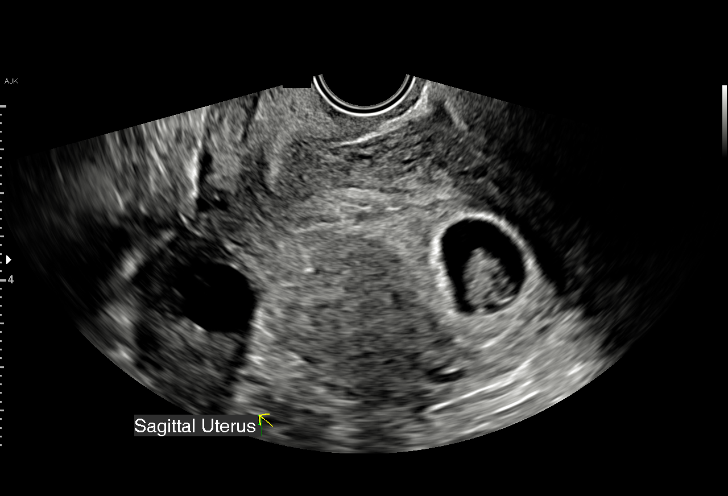

[15 of 28 positions shown; findings below may reference images not displayed]

FINDINGS: Intrauterine gestational sac: Present

Yolk sac:  Present

Embryo:  Present

Cardiac Activity: Present

Heart Rate: 171 bpm

CRL:  16.7 mm mm   8 w   0 d                  US EDC: 06/22/2021

Subchorionic hemorrhage:  None visualized.

Maternal uterus/adnexae: Ovaries are within normal limits. No free
fluid is seen.
IMPRESSION: Single live intrauterine gestation at 8 weeks.

## 2024-03-09 ENCOUNTER — Other Ambulatory Visit (HOSPITAL_COMMUNITY): Payer: Self-pay

## 2024-03-09 MED ORDER — WEGOVY 0.5 MG/0.5ML ~~LOC~~ SOAJ
0.5000 mg | SUBCUTANEOUS | 0 refills | Status: DC
  Filled 2024-03-09: qty 0.5, 28d supply, fill #0
  Filled 2024-03-10: qty 2, 28d supply, fill #0

## 2024-03-09 MED ORDER — WEGOVY 0.25 MG/0.5ML ~~LOC~~ SOAJ
0.2500 mg | SUBCUTANEOUS | 0 refills | Status: DC
  Filled 2024-03-09 – 2024-03-10 (×2): qty 2, 28d supply, fill #0

## 2024-03-10 ENCOUNTER — Other Ambulatory Visit (HOSPITAL_COMMUNITY): Payer: Self-pay

## 2024-04-08 ENCOUNTER — Other Ambulatory Visit (HOSPITAL_COMMUNITY): Payer: Self-pay

## 2024-04-13 ENCOUNTER — Other Ambulatory Visit (HOSPITAL_COMMUNITY): Payer: Self-pay

## 2024-04-13 ENCOUNTER — Encounter (HOSPITAL_COMMUNITY): Payer: Self-pay

## 2024-04-13 MED ORDER — WEGOVY 0.5 MG/0.5ML ~~LOC~~ SOAJ
0.5000 mg | SUBCUTANEOUS | 0 refills | Status: DC
  Filled 2024-04-13: qty 2, 28d supply, fill #0

## 2024-05-14 ENCOUNTER — Other Ambulatory Visit (HOSPITAL_COMMUNITY): Payer: Self-pay

## 2024-05-14 MED ORDER — WEGOVY 1 MG/0.5ML ~~LOC~~ SOAJ
1.0000 mg | SUBCUTANEOUS | 0 refills | Status: DC
  Filled 2024-05-14: qty 2, 28d supply, fill #0

## 2024-05-18 ENCOUNTER — Other Ambulatory Visit (HOSPITAL_COMMUNITY): Payer: Self-pay

## 2024-06-01 ENCOUNTER — Other Ambulatory Visit (HOSPITAL_COMMUNITY): Payer: Self-pay

## 2024-06-01 MED ORDER — WEGOVY 1.7 MG/0.75ML ~~LOC~~ SOAJ
1.7000 mg | SUBCUTANEOUS | 0 refills | Status: AC
  Filled 2024-06-01 – 2024-06-25 (×3): qty 9, 84d supply, fill #0

## 2024-06-14 ENCOUNTER — Other Ambulatory Visit (HOSPITAL_COMMUNITY): Payer: Self-pay

## 2024-06-15 ENCOUNTER — Other Ambulatory Visit (HOSPITAL_COMMUNITY): Payer: Self-pay

## 2024-06-22 ENCOUNTER — Other Ambulatory Visit (HOSPITAL_COMMUNITY): Payer: Self-pay

## 2024-06-22 MED ORDER — WEGOVY 1.7 MG/0.75ML ~~LOC~~ SOAJ
1.7000 mg | SUBCUTANEOUS | 0 refills | Status: AC
  Filled 2024-06-22: qty 9, 84d supply, fill #0

## 2024-06-24 ENCOUNTER — Other Ambulatory Visit (HOSPITAL_COMMUNITY): Payer: Self-pay

## 2024-06-25 ENCOUNTER — Other Ambulatory Visit (HOSPITAL_COMMUNITY): Payer: Self-pay

## 2024-08-18 ENCOUNTER — Ambulatory Visit: Admitting: Dermatology

## 2025-03-25 ENCOUNTER — Ambulatory Visit: Admitting: Dermatology
